# Patient Record
Sex: Male | Born: 1960 | Race: White | Hispanic: No | Marital: Married | Smoking: Former smoker
Health system: Southern US, Community
[De-identification: ages and names within clinical notes are randomized; demographics above are authoritative.]

## PROBLEM LIST (undated history)

## (undated) DIAGNOSIS — I10 Essential (primary) hypertension: Secondary | ICD-10-CM

## (undated) DIAGNOSIS — M1990 Unspecified osteoarthritis, unspecified site: Secondary | ICD-10-CM

## (undated) DIAGNOSIS — M109 Gout, unspecified: Secondary | ICD-10-CM

## (undated) DIAGNOSIS — E785 Hyperlipidemia, unspecified: Secondary | ICD-10-CM

## (undated) DIAGNOSIS — G709 Myoneural disorder, unspecified: Secondary | ICD-10-CM

## (undated) HISTORY — PX: SPINE SURGERY: SHX786

## (undated) HISTORY — PX: INNER EAR SURGERY: SHX679

## (undated) HISTORY — PX: BACK SURGERY: SHX140

## (undated) HISTORY — PX: CERVICAL DISC SURGERY: SHX588

## (undated) HISTORY — PX: OTHER SURGICAL HISTORY: SHX169

## (undated) HISTORY — PX: KNEE SURGERY: SHX244

## (undated) HISTORY — DX: Myoneural disorder, unspecified: G70.9

## (undated) HISTORY — DX: Hyperlipidemia, unspecified: E78.5

---

## 2001-03-13 ENCOUNTER — Ambulatory Visit: Admission: RE | Admit: 2001-03-13 | Discharge: 2001-03-13 | Payer: Self-pay | Admitting: Neurosurgery

## 2001-03-13 ENCOUNTER — Encounter: Payer: Self-pay | Admitting: Neurosurgery

## 2001-03-25 ENCOUNTER — Observation Stay (HOSPITAL_COMMUNITY): Admission: RE | Admit: 2001-03-25 | Discharge: 2001-03-26 | Payer: Self-pay | Admitting: Neurosurgery

## 2012-04-24 ENCOUNTER — Emergency Department (HOSPITAL_COMMUNITY): Payer: Medicare PPO

## 2012-04-24 ENCOUNTER — Encounter (HOSPITAL_COMMUNITY): Payer: Self-pay

## 2012-04-24 ENCOUNTER — Emergency Department (HOSPITAL_COMMUNITY)
Admission: EM | Admit: 2012-04-24 | Discharge: 2012-04-24 | Disposition: A | Discharge status: Home / Self Care | Payer: Medicare PPO | Attending: Emergency Medicine | Admitting: Emergency Medicine

## 2012-04-24 DIAGNOSIS — R109 Unspecified abdominal pain: Secondary | ICD-10-CM

## 2012-04-24 DIAGNOSIS — R110 Nausea: Secondary | ICD-10-CM | POA: Insufficient documentation

## 2012-04-24 DIAGNOSIS — R1011 Right upper quadrant pain: Secondary | ICD-10-CM | POA: Insufficient documentation

## 2012-04-24 DIAGNOSIS — Z79899 Other long term (current) drug therapy: Secondary | ICD-10-CM | POA: Insufficient documentation

## 2012-04-24 DIAGNOSIS — Z87891 Personal history of nicotine dependence: Secondary | ICD-10-CM | POA: Insufficient documentation

## 2012-04-24 DIAGNOSIS — I10 Essential (primary) hypertension: Secondary | ICD-10-CM | POA: Insufficient documentation

## 2012-04-24 DIAGNOSIS — M129 Arthropathy, unspecified: Secondary | ICD-10-CM | POA: Insufficient documentation

## 2012-04-24 DIAGNOSIS — R509 Fever, unspecified: Secondary | ICD-10-CM | POA: Insufficient documentation

## 2012-04-24 DIAGNOSIS — M109 Gout, unspecified: Secondary | ICD-10-CM | POA: Insufficient documentation

## 2012-04-24 HISTORY — DX: Essential (primary) hypertension: I10

## 2012-04-24 HISTORY — DX: Unspecified osteoarthritis, unspecified site: M19.90

## 2012-04-24 LAB — CBC WITH DIFFERENTIAL/PLATELET
Basophils Absolute: 0 10*3/uL (ref 0.0–0.1)
HCT: 52.3 % — ABNORMAL HIGH (ref 39.0–52.0)
Lymphocytes Relative: 13 % (ref 12–46)
Monocytes Absolute: 1.3 10*3/uL — ABNORMAL HIGH (ref 0.1–1.0)
Neutro Abs: 9.7 10*3/uL — ABNORMAL HIGH (ref 1.7–7.7)
RBC: 5.47 MIL/uL (ref 4.22–5.81)
RDW: 14.3 % (ref 11.5–15.5)
WBC: 12.9 10*3/uL — ABNORMAL HIGH (ref 4.0–10.5)

## 2012-04-24 LAB — URINALYSIS, ROUTINE W REFLEX MICROSCOPIC
Bilirubin Urine: NEGATIVE
Glucose, UA: NEGATIVE mg/dL
Hgb urine dipstick: NEGATIVE
Ketones, ur: NEGATIVE mg/dL
Protein, ur: NEGATIVE mg/dL

## 2012-04-24 LAB — COMPREHENSIVE METABOLIC PANEL
ALT: 32 U/L (ref 0–53)
AST: 33 U/L (ref 0–37)
CO2: 31 mEq/L (ref 19–32)
Chloride: 97 mEq/L (ref 96–112)
Creatinine, Ser: 1.06 mg/dL (ref 0.50–1.35)
GFR calc non Af Amer: 78 mL/min — ABNORMAL LOW (ref >90)
Sodium: 136 mEq/L (ref 135–145)
Total Bilirubin: 0.4 mg/dL (ref 0.3–1.2)

## 2012-04-24 MED ORDER — IOHEXOL 300 MG/ML  SOLN
100.0000 mL | Freq: Once | INTRAMUSCULAR | Status: AC | PRN
  Administered 2012-04-24: 100 mL via INTRAVENOUS

## 2012-04-24 MED ORDER — OXYCODONE-ACETAMINOPHEN 5-325 MG PO TABS: 1.0000 | ORAL_TABLET | Freq: Four times a day (QID) | ORAL | Status: DC | PRN

## 2012-04-24 MED ORDER — HYDROMORPHONE HCL PF 1 MG/ML IJ SOLN
INTRAMUSCULAR | Status: AC
  Administered 2012-04-24: 1 mg via INTRAVENOUS
  Filled 2012-04-24: qty 1

## 2012-04-24 MED ORDER — HYDROMORPHONE HCL PF 1 MG/ML IJ SOLN
1.0000 mg | Freq: Once | INTRAMUSCULAR | Status: AC
  Administered 2012-04-24: 1 mg via INTRAVENOUS
  Filled 2012-04-24: qty 1

## 2012-04-24 MED ORDER — ONDANSETRON HCL 4 MG/2ML IJ SOLN
4.0000 mg | Freq: Once | INTRAMUSCULAR | Status: AC
  Administered 2012-04-24: 4 mg via INTRAVENOUS
  Filled 2012-04-24: qty 2

## 2012-04-24 MED ORDER — HYDROMORPHONE HCL PF 1 MG/ML IJ SOLN
1.0000 mg | Freq: Once | INTRAMUSCULAR | Status: AC
  Administered 2012-04-24: 1 mg via INTRAVENOUS

## 2012-04-24 NOTE — ED Provider Notes (Signed)
History   This chart was scribed for Brent Lennert, MD by Brent Hamilton. The patient was seen in room APA03/APA03. Patient's care was started at 1358.  CSN: [id number]  Arrival date & time 04/24/12  1358   First MD Initiated Contact with Patient 04/24/12 1503      Chief Complaint  Patient presents with  . Abdominal Pain   Patient is a 51 y.o. male presenting with abdominal pain. The history is provided by the patient. No language interpreter was used.  Abdominal Pain The primary symptoms of the illness include abdominal pain, fever and nausea. The primary symptoms of the illness do not include vomiting, diarrhea or dysuria. The current episode started 2 days ago. The onset of the illness was sudden. The problem has been gradually worsening.  The abdominal pain began 2 days ago. The pain came on suddenly. The abdominal pain has been gradually worsening since its onset. The abdominal pain is located in the RUQ. The abdominal pain radiates to the back. The abdominal pain is relieved by nothing. The abdominal pain is exacerbated by certain positions.  The patient states that she believes she is currently not pregnant.    Brent Hamilton is a 51 y.o. male who presents to the Emergency Department complaining of 2 days of sudden onset, constant, gradually worsening, moderate RUQ abdominal pain and swelling with associated nausea mild fever. Pain is described as radiating to the back, aggravated with palpation, and alleviated by nothing. Pt reports evaluation by PCP Thursday with Dx of constipation. He has taken MiraLax and stool softer with no relief to pain. Pt denies chills, cough, congestion, rhinorrhea, chest pain, SOB, emesis, and h/o abdominal surgery. Pertinent history includes HTN and c6/c7 back surgery fusion.   Past Medical History  Diagnosis Date  . Hypertension   . Arthritis     Past Surgical History  Procedure Date  . Inner ear surgery     recently  . Ulnar nerve surgeries   .  Knee surgery   . Back surgery     c6/c7 fusion    No family history on file.  History  Substance Use Topics  . Smoking status: Never Smoker   . Smokeless tobacco: Not on file  . Alcohol Use: No    Review of Systems  Constitutional: Positive for fever.  Gastrointestinal: Positive for nausea and abdominal pain. Negative for vomiting and diarrhea.  Genitourinary: Negative for dysuria.  All other systems reviewed and are negative.    Allergies  Review of patient's allergies indicates no known allergies.  Home Medications   Current Outpatient Rx  Name Route Sig Dispense Refill  . ANTIPYRINE-BENZOCAINE 5.4-1.4 % OT SOLN Otic Place 3 drops in ear(s) 4 (four) times daily as needed.    Marland Kitchen VITAMIN D 1000 UNITS PO TABS Oral Take 2,000 Units by mouth daily.    . FENTANYL 50 MCG/HR TD PT72 Transdermal Place 1 patch onto the skin every 3 (three) days.    Marland Kitchen FOLIC ACID 1 MG PO TABS Oral Take 1 mg by mouth daily.    Marland Kitchen LISINOPRIL 40 MG PO TABS Oral Take 40 mg by mouth daily.    Marland Kitchen METHOTREXATE SODIUM (PF) 250 MG/10ML IJ SOLN Injection Inject 0.6 mLs as directed every 7 (seven) days.    Marland Kitchen MOXIFLOXACIN HCL 400 MG PO TABS Oral Take 400 mg by mouth daily. For 10 days    . ZINC GLUCONATE 50 MG PO TABS Oral Take 50 mg by mouth daily.  BP 140/96  Pulse 87  Temp 98.2 F (36.8 C) (Oral)  Resp 20  Ht 6' (1.829 m)  Wt 282 lb (127.914 kg)  BMI 38.25 kg/m2  SpO2 100%  Physical Exam  Constitutional: He is oriented to person, place, and time. He appears well-developed.  HENT:  Head: Normocephalic and atraumatic.  Eyes: Conjunctivae normal and EOM are normal. No scleral icterus.  Neck: Neck supple. No thyromegaly present.  Cardiovascular: Normal rate and regular rhythm.  Exam reveals no gallop and no friction rub.   No murmur heard. Pulmonary/Chest: No stridor. He has no wheezes. He has no rales. He exhibits no tenderness.  Abdominal: There is no rebound.       Moderate RUQ tenderness.    Musculoskeletal: Normal range of motion. He exhibits no edema.  Lymphadenopathy:    He has no cervical adenopathy.  Neurological: He is oriented to person, place, and time. Coordination normal.  Skin: No rash noted. No erythema.  Psychiatric: He has a normal mood and affect. His behavior is normal.    ED Course  Procedures DIAGNOSTIC STUDIES: Oxygen Saturation is 100% on room air, normal by my interpretation.    COORDINATION OF CARE: 15:01- Evaluated Pt. Pt is awake, alert,and oriented. Appears to be in discomfort.  15:10- Ordered US Abdomen Complete 1 time imaging.  Labs Reviewed  CBC WITH DIFFERENTIAL - Abnormal; Notable for the following:    WBC 12.9 (*)     Hemoglobin 17.1 (*)     HCT 52.3 (*)     Neutro Abs 9.7 (*)     Monocytes Absolute 1.3 (*)     All other components within normal limits  COMPREHENSIVE METABOLIC PANEL - Abnormal; Notable for the following:    GFR calc non Af Amer 78 (*)     All other components within normal limits  LIPASE, BLOOD  URINALYSIS, ROUTINE W REFLEX MICROSCOPIC   Ct Abdomen Pelvis W Contrast  04/24/2012  *RADIOLOGY REPORT*  Clinical Data: Abdominal pain  CT ABDOMEN AND PELVIS WITH CONTRAST  Technique:  Multidetector CT imaging of the abdomen and pelvis was performed following the standard protocol during bolus administration of intravenous contrast.  Contrast: OMNIPAQUE IOHEXOL 300 MG/ML  SOLN  Comparison: None.  Findings:  The lung bases are clear.  No pericardial or pleural effusion.  Mild diffuse fatty infiltration of the liver.  There are no focal liver abnormalities identified.  There is a stone identified within the dependent portion of the gallbladder measuring 6.2 mm, image 29.  There is no biliary dilatation.  The pancreas appears within normal limits.  The spleen is normal.  Both adrenal glands appear normal.  The right kidney is unremarkable.  The left kidney is normal.  There is no evidence for nephrolithiasis or obstructive  uropathy.  The urinary bladder appears normal.  Normal appearance of the prostate gland and seminal vesicles.  No enlarged upper abdominal lymph nodes.  There is no pelvic or inguinal adenopathy.  No free fluid noted.  The stomach and the small bowel loops appear within normal limits.  The appendix is visualized and appears normal.  The normal appearance of the colon.  Review of the visualized osseous structures is significant for mild multilevel lumbar degenerative disc disease.  IMPRESSION:  1.  No acute findings identified within the abdomen or pelvis. 2.  Gallstone.   Original Report Authenticated By: Rosealee Albee, M.D.    US Abdomen Limited Ruq  04/24/2012  *RADIOLOGY REPORT*  Clinical Data:  Right upper quadrant pain  LIMITED ABDOMINAL ULTRASOUND - RIGHT UPPER QUADRANT  Comparison:  None.  Findings:  Gallbladder:  No gallbladder wall thickening or pericholecystic fluid.  There is an echogenic gallstone which is mobile within the gallbladder lumen measuring 1.1 cm.  Negative sonographic Murphy's sign.  Common bile duct:  Upper limits of normal at 6 mm.  Liver:  Liver is homogeneous in echotexture.  There liver has an apparent nodular contour.  No evidence of ascites. No ductal dilatation.  IMPRESSION: 1.  Cholelithiasis without cholecystitis. 2.  Heterogeneous liver with nodule nodular contour  suggests findings of cirrhosis.  Recommend clinical correlation and consider further examination with MRI with and without contrast in the outpatient setting.            Original Report Authenticated By: Genevive Bi, M.D.      No diagnosis found.    MDM      The chart was scribed for me under my direct supervision.  I personally performed the history, physical, and medical decision making and all procedures in the evaluation of this patient.Brent Lennert, MD 04/24/12 [phone]

## 2012-04-24 NOTE — ED Notes (Signed)
Pt to ultrasound

## 2012-04-24 NOTE — ED Notes (Signed)
Pt returned from ct scan, wife at bedside.

## 2012-04-24 NOTE — ED Notes (Signed)
Pt reports having upper quad ab pain for 2 days, was seen by pmd and told he may be constipated/impacted, has taken miralax and mag. Citrate. Only passed water, pain is worse today.

## 2012-04-24 NOTE — ED Notes (Signed)
Pt reports went to PCP THursday for upper abd pain and swelling.  Says his PCP tolsd him he thought he was impacted.  Pt says pain is more severe now and is radiating around to back.  PCP intstructed pt to take miralax and mag citrate.  Pt says only passed water.

## 2012-04-28 ENCOUNTER — Encounter (HOSPITAL_COMMUNITY)
Admission: RE | Admit: 2012-04-28 | Discharge: 2012-04-28 | Disposition: A | Discharge status: Home / Self Care | Payer: Medicare FFS | Source: Ambulatory Visit | Attending: General Surgery | Admitting: General Surgery

## 2012-04-28 ENCOUNTER — Encounter (HOSPITAL_COMMUNITY): Payer: Self-pay

## 2012-04-28 ENCOUNTER — Encounter (HOSPITAL_COMMUNITY): Payer: Self-pay | Admitting: Pharmacy Technician

## 2012-04-28 HISTORY — DX: Gout, unspecified: M10.9

## 2012-04-28 NOTE — Progress Notes (Signed)
04/28/12 1524  OBSTRUCTIVE SLEEP APNEA  Have you ever been diagnosed with sleep apnea through a sleep study? No  Do you snore loudly (loud enough to be heard through closed doors)?  0  Do you often feel tired, fatigued, or sleepy during the daytime? 0  Has anyone observed you stop breathing during your sleep? 0  Do you have, or are you being treated for high blood pressure? 1  BMI more than 35 kg/m2? 1  Age over 53 years old? 1  Neck circumference greater than 40 cm/18 inches? 1  Gender: 1  Obstructive Sleep Apnea Score 5   Score 4 or greater  Results sent to PCP

## 2012-04-28 NOTE — Patient Instructions (Addendum)
20 Brent Hamilton  04/28/2012   Your procedure is scheduled on:   05/01/2012   Report to [facility] at  700  AM.  Call this number if you have problems the morning of surgery: [phone]   Remember:   Do not eat food:After Midnight.  May have clear liquids:until Midnight .    Take these medicines the morning of surgery with A SIP OF WATER:  Percocet,lisinopril. May wear Duragesic patch in.   Do not wear jewelry, make-up or nail polish.  Do not wear lotions, powders, or perfumes.   Do not shave 48 hours prior to surgery. Men may shave face and neck.  Do not bring valuables to the hospital.  Contacts, dentures or bridgework may not be worn into surgery.  Leave suitcase in the car. After surgery it may be brought to your room.  For patients admitted to the hospital, checkout time is 11:00 AM the day of discharge.   Patients discharged the day of surgery will not be allowed to drive home.  Name and phone number of your driver: family  Special Instructions: Shower using CHG 2 nights before surgery and the night before surgery.  If you shower the day of surgery use CHG.  Use special wash - you have one bottle of CHG for all showers.  You should use approximately 1/3 of the bottle for each shower.   Please read over the following fact sheets that you were given: Pain Booklet, Coughing and Deep Breathing, MRSA Information, Surgical Site Infection Prevention, Anesthesia Post-op Instructions and Care and Recovery After Surgery Laparoscopic Cholecystectomy Laparoscopic cholecystectomy is surgery to remove the gallbladder. The gallbladder is located slightly to the right of center in the abdomen, behind the liver. It is a concentrating and storage sac for the bile produced in the liver. Bile aids in the digestion and absorption of fats. Gallbladder disease (cholecystitis) is an inflammation of your gallbladder. This condition is usually caused by a buildup of gallstones (cholelithiasis) in your  gallbladder. Gallstones can block the flow of bile, resulting in inflammation and pain. In severe cases, emergency surgery may be required. When emergency surgery is not required, you will have time to prepare for the procedure. Laparoscopic surgery is an alternative to open surgery. Laparoscopic surgery usually has a shorter recovery time. Your common bile duct may also need to be examined and explored. Your caregiver will discuss this with you if he or she feels this should be done. If stones are found in the common bile duct, they may be removed. LET YOUR CAREGIVER KNOW ABOUT:  Allergies to food or medicine.  Medicines taken, including vitamins, herbs, eyedrops, over-the-counter medicines, and creams.  Use of steroids (by mouth or creams).  Previous problems with anesthetics or numbing medicines.  History of bleeding problems or blood clots.  Previous surgery.  Other health problems, including diabetes and kidney problems.  Possibility of pregnancy, if this applies. RISKS AND COMPLICATIONS All surgery is associated with risks. Some problems that may occur following this procedure include:  Infection.  Damage to the common bile duct, nerves, arteries, veins, or other internal organs such as the stomach or intestines.  Bleeding.  A stone may remain in the common bile duct. BEFORE THE PROCEDURE  Do not take aspirin for 3 days prior to surgery or blood thinners for 1 week prior to surgery.  Do not eat or drink anything after midnight the night before surgery.  Let your caregiver know if you  develop a cold or other infectious problem prior to surgery.  You should be present 60 minutes before the procedure or as directed. PROCEDURE  You will be given medicine that makes you sleep (general anesthetic). When you are asleep, your surgeon will make several small cuts (incisions) in your abdomen. One of these incisions is used to insert a small, lighted scope (laparoscope) into the  abdomen. The laparoscope helps the surgeon see into your abdomen. Carbon dioxide gas will be pumped into your abdomen. The gas allows more room for the surgeon to perform your surgery. Other operating instruments are inserted through the other incisions. Laparoscopic procedures may not be appropriate when:  There is major scarring from previous surgery.  The gallbladder is extremely inflamed.  There are bleeding disorders or unexpected cirrhosis of the liver.  A pregnancy is near term.  Other conditions make the laparoscopic procedure impossible. If your surgeon feels it is not safe to continue with a laparoscopic procedure, he or she will perform an open abdominal procedure. In this case, the surgeon will make an incision to open the abdomen. This gives the surgeon a larger view and field to work within. This may allow the surgeon to perform procedures that sometimes cannot be performed with a laparoscope alone. Open surgery has a longer recovery time. AFTER THE PROCEDURE  You will be taken to the recovery area where a nurse will watch and check your progress.  You may be allowed to go home the same day.  Do not resume physical activities until directed by your caregiver.  You may resume a normal diet and activities as directed. Document Released: 06/17/2005 Document Revised: 09/09/2011 Document Reviewed: 11/30/2010 [facility] Patient Information 2013 [address], [place]. PATIENT INSTRUCTIONS POST-ANESTHESIA  IMMEDIATELY FOLLOWING SURGERY:  Do not drive or operate machinery for the first twenty four hours after surgery.  Do not make any important decisions for twenty four hours after surgery or while taking narcotic pain medications or sedatives.  If you develop intractable nausea and vomiting or a severe headache please notify your doctor immediately.  FOLLOW-UP:  Please make an appointment with your surgeon as instructed. You do not need to follow up with anesthesia unless specifically  instructed to do so.  WOUND CARE INSTRUCTIONS (if applicable):  Keep a dry clean dressing on the anesthesia/puncture wound site if there is drainage.  Once the wound has quit draining you may leave it open to air.  Generally you should leave the bandage intact for twenty four hours unless there is drainage.  If the epidural site drains for more than 36-48 hours please call the anesthesia department.  QUESTIONS?:  Please feel free to call your physician or the hospital operator if you have any questions, and they will be happy to assist you.

## 2012-04-28 NOTE — H&P (Signed)
NTS SOAP Note  Vital Signs:  Vitals as of: 04/28/2012: Systolic 159: Diastolic 98: Heart Rate 95: Temp 99.56F: Height 26ft 0in: Weight 284Lbs 0 Ounces: Pain Level 7: BMI 39  BMI : 38.52 kg/m2  Subjective: This 51 Years 75 Months old Male presents for of    ABDOMINAL ISSUES: ,Has been having intermittent episodes of right upper quadrant abdominal pai with radiation to the right flank, nausea, bloating, and fatty food intolerance for some time now.  Recent CT scan of the abdomen and U/S of gallbladder reveals fatty liver, cholelithiasis.  Review of Symptoms:  Constitutional:  chills Head:unremarkable    Eyes:unremarkable   Nose/Mouth/Throat:unremarkable Cardiovascular:  unremarkable   Respiratory:unremarkable   Gastrointestinal:  unremarkable   Genitourinary:unremarkable       back pain Skin:unremarkable Hematolgic/Lymphatic:unremarkable     Allergic/Immunologic:unremarkable     Past Medical History:    Reviewed   Past Medical History  Surgical History: neck fusion, multiple bilateral knee surgeries, carpal tunnel surgeries Medical Problems: none Allergies: nkda Medications: none   Social History:Reviewed  Social History  Preferred Language: English Race:  White Ethnicity: Not Hispanic / Latino Age: 51 Years 6 Months Marital Status:  M Alcohol:  No Recreational drug(s):  No   Smoking Status: Never smoker reviewed on 04/28/2012 Functional Status reviewed on mm/dd/yyyy ------------------------------------------------ Bathing: Normal Cooking: Normal Dressing: Normal Driving: Normal Eating: Normal Managing Meds: Normal Oral Care: Normal Shopping: Normal Toileting: Normal Transferring: Normal Walking: Normal Cognitive Status reviewed on mm/dd/yyyy ------------------------------------------------ Attention: Normal Decision Making: Normal Language: Normal Memory: Normal Motor: Normal Perception: Normal Problem  Solving: Normal Visual and Spatial: Normal   Family History:  Reviewed   Family History              Father:  Cancer-stomach             Mother:  Coronary Artery Disease    Objective Information: General:  Well appearing, well nourished in no distress.   no scleral icterus Heart:  RRR, no murmur Lungs:    CTA bilaterally, no wheezes, rhonchi, rales.  Breathing unlabored. Abdomen:Soft, NT/ND, no HSM, no masses.  Slightly tender in right upper quadrant to deep palpation.  Assessment:Biliary colic, cholelithiasis  Diagnosis &amp; Procedure: DiagnosisCode: 574.20, ProcedureCode: 29562,    Plan:Scheduled for laparoscopic cholecystectomy on 05/01/12.   Patient Education:Alternative treatments to surgery were discussed with patient (and family).  Risks and benefits  of procedure including hepatobiliary injury, bile leak, and possibility of an open procedure were fully explained to the patient (and family) who gave informed consent. Patient/family questions were addressed.  Follow-up:Pending Surgery

## 2012-05-01 ENCOUNTER — Encounter (HOSPITAL_COMMUNITY): Payer: Self-pay | Admitting: Anesthesiology

## 2012-05-01 ENCOUNTER — Encounter (HOSPITAL_COMMUNITY)
Admission: RE | Disposition: A | Discharge status: Home / Self Care | Payer: Self-pay | Source: Ambulatory Visit | Attending: General Surgery

## 2012-05-01 ENCOUNTER — Ambulatory Visit (HOSPITAL_COMMUNITY): Payer: Medicare FFS | Admitting: Anesthesiology

## 2012-05-01 ENCOUNTER — Encounter (HOSPITAL_COMMUNITY): Payer: Self-pay | Admitting: *Deleted

## 2012-05-01 ENCOUNTER — Ambulatory Visit (HOSPITAL_COMMUNITY)
Admission: RE | Admit: 2012-05-01 | Discharge: 2012-05-01 | Disposition: A | Discharge status: Home / Self Care | Payer: Medicare FFS | Source: Ambulatory Visit | Attending: General Surgery | Admitting: General Surgery

## 2012-05-01 DIAGNOSIS — K8020 Calculus of gallbladder without cholecystitis without obstruction: Secondary | ICD-10-CM | POA: Insufficient documentation

## 2012-05-01 DIAGNOSIS — I10 Essential (primary) hypertension: Secondary | ICD-10-CM | POA: Insufficient documentation

## 2012-05-01 DIAGNOSIS — Z01810 Encounter for preprocedural cardiovascular examination: Secondary | ICD-10-CM | POA: Insufficient documentation

## 2012-05-01 DIAGNOSIS — Z01812 Encounter for preprocedural laboratory examination: Secondary | ICD-10-CM | POA: Insufficient documentation

## 2012-05-01 HISTORY — PX: CHOLECYSTECTOMY: SHX55

## 2012-05-01 SURGERY — LAPAROSCOPIC CHOLECYSTECTOMY
Anesthesia: General | Wound class: Clean Contaminated

## 2012-05-01 MED ORDER — FENTANYL CITRATE 0.05 MG/ML IJ SOLN
INTRAMUSCULAR | Status: AC
  Filled 2012-05-01: qty 5

## 2012-05-01 MED ORDER — PROPOFOL 10 MG/ML IV EMUL
INTRAVENOUS | Status: DC | PRN
  Administered 2012-05-01: 300 mg via INTRAVENOUS

## 2012-05-01 MED ORDER — EPHEDRINE SULFATE 50 MG/ML IJ SOLN
INTRAMUSCULAR | Status: AC
  Filled 2012-05-01: qty 1

## 2012-05-01 MED ORDER — BUPIVACAINE HCL (PF) 0.5 % IJ SOLN
INTRAMUSCULAR | Status: AC
  Filled 2012-05-01: qty 30

## 2012-05-01 MED ORDER — MIDAZOLAM HCL 2 MG/2ML IJ SOLN
INTRAMUSCULAR | Status: AC
  Filled 2012-05-01: qty 2

## 2012-05-01 MED ORDER — PROPOFOL 10 MG/ML IV EMUL
INTRAVENOUS | Status: AC
  Filled 2012-05-01: qty 20

## 2012-05-01 MED ORDER — SUCCINYLCHOLINE CHLORIDE 20 MG/ML IJ SOLN
INTRAMUSCULAR | Status: AC
  Filled 2012-05-01: qty 1

## 2012-05-01 MED ORDER — ENOXAPARIN SODIUM 40 MG/0.4ML ~~LOC~~ SOLN
40.0000 mg | Freq: Once | SUBCUTANEOUS | Status: AC
  Administered 2012-05-01: 40 mg via SUBCUTANEOUS

## 2012-05-01 MED ORDER — HYDROCODONE-ACETAMINOPHEN 10-500 MG PO TABS: 1.0000 | ORAL_TABLET | ORAL | Status: DC | PRN

## 2012-05-01 MED ORDER — ENOXAPARIN SODIUM 40 MG/0.4ML ~~LOC~~ SOLN
SUBCUTANEOUS | Status: AC
  Filled 2012-05-01: qty 0.4

## 2012-05-01 MED ORDER — CHLORHEXIDINE GLUCONATE 4 % EX LIQD: 1.0000 "application " | Freq: Once | CUTANEOUS | Status: DC

## 2012-05-01 MED ORDER — SODIUM CHLORIDE 0.9 % IR SOLN
Status: DC | PRN
  Administered 2012-05-01: 1000 mL

## 2012-05-01 MED ORDER — FENTANYL CITRATE 0.05 MG/ML IJ SOLN
INTRAMUSCULAR | Status: AC
  Filled 2012-05-01: qty 2

## 2012-05-01 MED ORDER — GLYCOPYRROLATE 0.2 MG/ML IJ SOLN
INTRAMUSCULAR | Status: DC | PRN
  Administered 2012-05-01: 0.6 mg via INTRAVENOUS

## 2012-05-01 MED ORDER — GLYCOPYRROLATE 0.2 MG/ML IJ SOLN
INTRAMUSCULAR | Status: AC
  Filled 2012-05-01: qty 2

## 2012-05-01 MED ORDER — FENTANYL CITRATE 0.05 MG/ML IJ SOLN
25.0000 ug | INTRAMUSCULAR | Status: DC | PRN
  Administered 2012-05-01 (×5): 50 ug via INTRAVENOUS

## 2012-05-01 MED ORDER — FENTANYL CITRATE 0.05 MG/ML IJ SOLN
50.0000 ug | INTRAMUSCULAR | Status: DC | PRN
  Administered 2012-05-01: 50 ug via INTRAVENOUS

## 2012-05-01 MED ORDER — ROCURONIUM BROMIDE 50 MG/5ML IV SOLN
INTRAVENOUS | Status: AC
  Filled 2012-05-01: qty 1

## 2012-05-01 MED ORDER — KETOROLAC TROMETHAMINE 30 MG/ML IJ SOLN
30.0000 mg | Freq: Once | INTRAMUSCULAR | Status: AC
  Administered 2012-05-01: 30 mg via INTRAVENOUS

## 2012-05-01 MED ORDER — DEXAMETHASONE SODIUM PHOSPHATE 4 MG/ML IJ SOLN
INTRAMUSCULAR | Status: AC
  Filled 2012-05-01: qty 1

## 2012-05-01 MED ORDER — LIDOCAINE HCL (PF) 1 % IJ SOLN
INTRAMUSCULAR | Status: AC
  Filled 2012-05-01: qty 5

## 2012-05-01 MED ORDER — CEFAZOLIN SODIUM-DEXTROSE 2-3 GM-% IV SOLR
INTRAVENOUS | Status: AC
  Filled 2012-05-01: qty 50

## 2012-05-01 MED ORDER — CEFAZOLIN SODIUM 1-5 GM-% IV SOLN
INTRAVENOUS | Status: AC
  Filled 2012-05-01: qty 50

## 2012-05-01 MED ORDER — BUPIVACAINE HCL 0.5 % IJ SOLN
INTRAMUSCULAR | Status: DC | PRN
  Administered 2012-05-01: 10 mL

## 2012-05-01 MED ORDER — NEOSTIGMINE METHYLSULFATE 1 MG/ML IJ SOLN
INTRAMUSCULAR | Status: AC
  Filled 2012-05-01: qty 10

## 2012-05-01 MED ORDER — ONDANSETRON HCL 4 MG/2ML IJ SOLN: 4.0000 mg | Freq: Once | INTRAMUSCULAR | Status: DC | PRN

## 2012-05-01 MED ORDER — NEOSTIGMINE METHYLSULFATE 1 MG/ML IJ SOLN
INTRAMUSCULAR | Status: DC | PRN
  Administered 2012-05-01: 4 mg via INTRAVENOUS

## 2012-05-01 MED ORDER — SUCCINYLCHOLINE CHLORIDE 20 MG/ML IJ SOLN
INTRAMUSCULAR | Status: DC | PRN
  Administered 2012-05-01: 140 mg via INTRAVENOUS

## 2012-05-01 MED ORDER — ONDANSETRON HCL 4 MG/2ML IJ SOLN
4.0000 mg | Freq: Once | INTRAMUSCULAR | Status: AC
  Administered 2012-05-01: 4 mg via INTRAVENOUS

## 2012-05-01 MED ORDER — GLYCOPYRROLATE 0.2 MG/ML IJ SOLN
0.3000 mg | Freq: Once | INTRAMUSCULAR | Status: AC
  Administered 2012-05-01: 0.3 mg via INTRAVENOUS

## 2012-05-01 MED ORDER — LACTATED RINGERS IV SOLN
INTRAVENOUS | Status: DC
  Administered 2012-05-01: 08:00:00 via INTRAVENOUS

## 2012-05-01 MED ORDER — CEFAZOLIN SODIUM-DEXTROSE 2-3 GM-% IV SOLR: 2.0000 g | INTRAVENOUS | Status: DC

## 2012-05-01 MED ORDER — EPHEDRINE SULFATE 50 MG/ML IJ SOLN
INTRAMUSCULAR | Status: DC | PRN
  Administered 2012-05-01: 10 mg via INTRAVENOUS

## 2012-05-01 MED ORDER — DEXAMETHASONE SODIUM PHOSPHATE 4 MG/ML IJ SOLN
4.0000 mg | Freq: Once | INTRAMUSCULAR | Status: AC
  Administered 2012-05-01: 4 mg via INTRAVENOUS

## 2012-05-01 MED ORDER — ROCURONIUM BROMIDE 100 MG/10ML IV SOLN
INTRAVENOUS | Status: DC | PRN
  Administered 2012-05-01: 10 mg via INTRAVENOUS
  Administered 2012-05-01: 30 mg via INTRAVENOUS

## 2012-05-01 MED ORDER — GLYCOPYRROLATE 0.2 MG/ML IJ SOLN
INTRAMUSCULAR | Status: AC
Start: 2012-05-01 — End: 2012-05-01
  Filled 2012-05-01: qty 3

## 2012-05-01 MED ORDER — KETOROLAC TROMETHAMINE 30 MG/ML IJ SOLN
INTRAMUSCULAR | Status: AC
  Filled 2012-05-01: qty 1

## 2012-05-01 MED ORDER — CEFAZOLIN SODIUM-DEXTROSE 2-3 GM-% IV SOLR
INTRAVENOUS | Status: DC | PRN
  Administered 2012-05-01: 3 g via INTRAVENOUS

## 2012-05-01 MED ORDER — MIDAZOLAM HCL 2 MG/2ML IJ SOLN
1.0000 mg | INTRAMUSCULAR | Status: DC | PRN
  Administered 2012-05-01: 2 mg via INTRAVENOUS

## 2012-05-01 MED ORDER — ONDANSETRON HCL 4 MG/2ML IJ SOLN
INTRAMUSCULAR | Status: AC
  Filled 2012-05-01: qty 2

## 2012-05-01 MED ORDER — FENTANYL CITRATE 0.05 MG/ML IJ SOLN
INTRAMUSCULAR | Status: DC | PRN
  Administered 2012-05-01 (×4): 50 ug via INTRAVENOUS
  Administered 2012-05-01: 100 ug via INTRAVENOUS
  Administered 2012-05-01: 50 ug via INTRAVENOUS

## 2012-05-01 MED ORDER — LACTATED RINGERS IV SOLN
INTRAVENOUS | Status: DC | PRN
  Administered 2012-05-01 (×2): via INTRAVENOUS

## 2012-05-01 MED ORDER — HEMOSTATIC AGENTS (NO CHARGE) OPTIME
TOPICAL | Status: DC | PRN
  Administered 2012-05-01: 1 via TOPICAL

## 2012-05-01 SURGICAL SUPPLY — 32 items
APPLIER CLIP LAPSCP 10X32 DD (CLIP) ×2 IMPLANT
BAG HAMPER (MISCELLANEOUS) ×2 IMPLANT
CLOTH BEACON ORANGE TIMEOUT ST (SAFETY) ×2 IMPLANT
COVER LIGHT HANDLE STERIS (MISCELLANEOUS) ×4 IMPLANT
DECANTER SPIKE VIAL GLASS SM (MISCELLANEOUS) ×2 IMPLANT
DURAPREP 26ML APPLICATOR (WOUND CARE) ×2 IMPLANT
ELECT REM PT RETURN 9FT ADLT (ELECTROSURGICAL) ×2
ELECTRODE REM PT RTRN 9FT ADLT (ELECTROSURGICAL) ×1 IMPLANT
FILTER SMOKE EVAC LAPAROSHD (FILTER) ×2 IMPLANT
FORMALIN 10 PREFIL 120ML (MISCELLANEOUS) ×2 IMPLANT
GLOVE BIO SURGEON STRL SZ7.5 (GLOVE) ×2 IMPLANT
GLOVE EXAM NITRILE MD LF STRL (GLOVE) ×2 IMPLANT
GLOVE INDICATOR 7.0 STRL GRN (GLOVE) ×4 IMPLANT
GLOVE SS BIOGEL STRL SZ 6.5 (GLOVE) ×2 IMPLANT
GLOVE SUPERSENSE BIOGEL SZ 6.5 (GLOVE) ×2
GOWN STRL REIN XL XLG (GOWN DISPOSABLE) ×6 IMPLANT
HEMOSTAT SNOW SURGICEL 2X4 (HEMOSTASIS) ×2 IMPLANT
INST SET LAPROSCOPIC AP (KITS) ×2 IMPLANT
KIT ROOM TURNOVER APOR (KITS) ×2 IMPLANT
KIT TROCAR LAP CHOLE (TROCAR) ×2 IMPLANT
MANIFOLD NEPTUNE II (INSTRUMENTS) ×2 IMPLANT
NS IRRIG 1000ML POUR BTL (IV SOLUTION) ×2 IMPLANT
PACK LAP CHOLE LZT030E (CUSTOM PROCEDURE TRAY) ×2 IMPLANT
PAD ARMBOARD 7.5X6 YLW CONV (MISCELLANEOUS) ×2 IMPLANT
POUCH SPECIMEN RETRIEVAL 10MM (ENDOMECHANICALS) ×2 IMPLANT
SET BASIN LINEN APH (SET/KITS/TRAYS/PACK) ×2 IMPLANT
SPONGE GAUZE 2X2 8PLY STRL LF (GAUZE/BANDAGES/DRESSINGS) ×8 IMPLANT
STAPLER VISISTAT (STAPLE) ×2 IMPLANT
SUT VICRYL 0 UR6 27IN ABS (SUTURE) ×2 IMPLANT
TAPE CLOTH SURG 4X10 WHT LF (GAUZE/BANDAGES/DRESSINGS) ×2 IMPLANT
WARMER LAPAROSCOPE (MISCELLANEOUS) ×2 IMPLANT
YANKAUER SUCT 12FT TUBE ARGYLE (SUCTIONS) ×2 IMPLANT

## 2012-05-01 NOTE — Transfer of Care (Signed)
  Anesthesia Post-op Note  Patient: Brent Hamilton  Procedure(s) Performed: Procedure(s) (LRB) with comments: LAPAROSCOPIC CHOLECYSTECTOMY (N/A)  Patient Location: PACU  Anesthesia Type: General  Level of Consciousness: awake, alert , oriented and patient cooperative  Airway and Oxygen Therapy: Patient Spontanous Breathing and Patient connected to face mask oxygen  Post-op Pain: mild  Post-op Assessment: Post-op Vital signs reviewed, Patient's Cardiovascular Status Stable, Respiratory Function Stable, Patent Airway and No signs of Nausea or vomiting  Post-op Vital Signs: Reviewed and stable  Complications: No apparent anesthesia complications  

## 2012-05-01 NOTE — Anesthesia Preprocedure Evaluation (Signed)
Anesthesia Evaluation  Patient identified by MRN, date of birth, ID band Patient awake    Reviewed: Allergy & Precautions, H&P , NPO status , Patient's Chart, lab work & pertinent test results  History of Anesthesia Complications Negative for: history of anesthetic complications  Airway Mallampati: III TM Distance: >3 FB Neck ROM: Limited    Dental  (+) Teeth Intact   Pulmonary sleep apnea , former smoker,  breath sounds clear to auscultation        Cardiovascular hypertension, Pt. on medications Rhythm:Regular Rate:Normal     Neuro/Psych    GI/Hepatic   Endo/Other  Morbid obesity  Renal/GU      Musculoskeletal   Abdominal (+) + obese,   Peds  Hematology   Anesthesia Other Findings   Reproductive/Obstetrics                           Anesthesia Physical Anesthesia Plan  ASA: III  Anesthesia Plan: General   Post-op Pain Management:    Induction: Intravenous, Rapid sequence and Cricoid pressure planned  Airway Management Planned: Oral ETT and Video Laryngoscope Planned  Additional Equipment:   Intra-op Plan:   Post-operative Plan: Extubation in OR  Informed Consent: I have reviewed the patients History and Physical, chart, labs and discussed the procedure including the risks, benefits and alternatives for the proposed anesthesia with the patient or authorized representative who has indicated his/her understanding and acceptance.     Plan Discussed with:   Anesthesia Plan Comments:         Anesthesia Quick Evaluation

## 2012-05-01 NOTE — Op Note (Signed)
Patient:  Brent Hamilton  DOB:  25-Sep-1960  MRN:  [id number]   Preop Diagnosis:  Biliary colic, cholelithiasis  Postop Diagnosis:  Same  Procedure:  Laparoscopic cholecystectomy  Surgeon:  Franky Macho, M.D.  Anes:  General endotracheal  Indications:  Patient is a 51 year old white male who presents with biliary colic secondary to cholelithiasis. The risks and benefits of the procedure including bleeding, infection, hepatobiliary injury, the possibility of an open procedure were fully explained to the patient, gave informed consent.  Procedure note:  The patient was placed in the supine position. After induction of general endotracheal anesthesia, the abdomen was prepped and draped using the usual sterile technique with DuraPrep. Surgical site confirmation was performed.  A supraumbilical incision was made down to the fascia. A Veress needle was introduced into the abdominal cavity and confirmation of placement was done using the saline drop test. The abdomen was then insufflated to 16 mm mercury pressure. An 11 mm trocar was introduced into the abdominal cavity under direct visualization without difficulty. The patient was placed in reverse Trendelenburg position and additional 11 mm trocar was placed the epigastric region and 5 mm trochars were placed the right upper quadrant and right flank regions. Liver was inspected and noted to be within normal limits. The gallbladder was retracted in a dynamic fashion in order to facilitate visualization of the triangle of Calot. The cystic duct was first identified. Its junction were to the infundibulum flow identified. Endoclips were placed proximally and distally on the cystic duct and cystic duct was divided. This was likewise done to the cystic artery. The gallbladder was then freed away from the gallbladder fossa using Bovie electrocautery. The gallbladder delivered through the epigastric trocar site using an Endo Catch bag. The gallbladder fossa was  inspected no abnormal bleeding or bile leakage was noted. Surgicel was placed in the gallbladder fossa. All fluid and air were then evacuated from the abdominal cavity prior to removal of the trochars.  All wounds were irrigated with normal saline. All wounds were injected with 0.5% Sensorcaine. The supraumbilical fascia as well as epigastric fascia were reapproximated using 0 Vicryl interrupted sutures. All skin incisions were closed using staples. Betadine ointment and dry sterile dressings were applied.  All tape and needle counts were correct at the end of the procedure. Patient was extubated in the operating room and went back to recovery room awake in stable condition.  Complications:  None  EBL:  Minimal  Specimen:  Gallbladder

## 2012-05-01 NOTE — Anesthesia Postprocedure Evaluation (Signed)
  Anesthesia Post-op Note  Patient: Brent Hamilton  Procedure(s) Performed: Procedure(s) (LRB) with comments: LAPAROSCOPIC CHOLECYSTECTOMY (N/A)  Patient Location: PACU  Anesthesia Type: General  Level of Consciousness: awake, alert , oriented and patient cooperative  Airway and Oxygen Therapy: Patient Spontanous Breathing and Patient connected to face mask oxygen  Post-op Pain: mild  Post-op Assessment: Post-op Vital signs reviewed, Patient's Cardiovascular Status Stable, Respiratory Function Stable, Patent Airway and No signs of Nausea or vomiting  Post-op Vital Signs: Reviewed and stable  Complications: No apparent anesthesia complications

## 2012-05-01 NOTE — Anesthesia Procedure Notes (Signed)
Procedure Name: Intubation Date/Time: 05/01/2012 8:36 AM Performed by: Carolyne Littles, AMY L Pre-anesthesia Checklist: Patient identified, Patient being monitored, Timeout performed, Emergency Drugs available and Suction available Patient Re-evaluated:Patient Re-evaluated prior to inductionOxygen Delivery Method: Circle System Utilized Preoxygenation: Pre-oxygenation with 100% oxygen Intubation Type: IV induction, Rapid sequence and Cricoid Pressure applied Grade View: Grade I Tube type: Oral Tube size: 8.0 mm Number of attempts: 1 Airway Equipment and Method: stylet and Video-laryngoscopy Placement Confirmation: ETT inserted through vocal cords under direct vision,  positive ETCO2 and breath sounds checked- equal and bilateral Secured at: 24 cm Tube secured with: Tape Dental Injury: Teeth and Oropharynx as per pre-operative assessment

## 2012-05-01 NOTE — Interval H&P Note (Signed)
History and Physical Interval Note:  05/01/2012 8:17 AM  Brent Hamilton  has presented today for surgery, with the diagnosis of Cholelithiasis  The various methods of treatment have been discussed with the patient and family. After consideration of risks, benefits and other options for treatment, the patient has consented to  Procedure(s) (LRB) with comments: LAPAROSCOPIC CHOLECYSTECTOMY (N/A) as a surgical intervention .  The patient's history has been reviewed, patient examined, no change in status, stable for surgery.  I have reviewed the patient's chart and labs.  Questions were answered to the patient's satisfaction.     Franky Macho A

## 2012-05-01 NOTE — Preoperative (Signed)
Beta Blockers   Reason not to administer Beta Blockers:Not Applicable 

## 2012-05-05 ENCOUNTER — Encounter (HOSPITAL_COMMUNITY): Payer: Self-pay | Admitting: General Surgery

## 2012-07-01 ENCOUNTER — Encounter (HOSPITAL_COMMUNITY): Payer: Self-pay

## 2012-07-01 ENCOUNTER — Emergency Department (HOSPITAL_COMMUNITY)
Admission: EM | Admit: 2012-07-01 | Discharge: 2012-07-01 | Disposition: A | Discharge status: Home / Self Care | Payer: Medicare PPO | Attending: Emergency Medicine | Admitting: Emergency Medicine

## 2012-07-01 ENCOUNTER — Emergency Department (HOSPITAL_COMMUNITY): Payer: Medicare PPO

## 2012-07-01 DIAGNOSIS — M25539 Pain in unspecified wrist: Secondary | ICD-10-CM | POA: Insufficient documentation

## 2012-07-01 DIAGNOSIS — Z9889 Other specified postprocedural states: Secondary | ICD-10-CM | POA: Insufficient documentation

## 2012-07-01 DIAGNOSIS — M129 Arthropathy, unspecified: Secondary | ICD-10-CM | POA: Insufficient documentation

## 2012-07-01 DIAGNOSIS — R209 Unspecified disturbances of skin sensation: Secondary | ICD-10-CM | POA: Insufficient documentation

## 2012-07-01 DIAGNOSIS — M109 Gout, unspecified: Secondary | ICD-10-CM | POA: Insufficient documentation

## 2012-07-01 DIAGNOSIS — I10 Essential (primary) hypertension: Secondary | ICD-10-CM | POA: Insufficient documentation

## 2012-07-01 DIAGNOSIS — Z79899 Other long term (current) drug therapy: Secondary | ICD-10-CM | POA: Insufficient documentation

## 2012-07-01 DIAGNOSIS — M25522 Pain in left elbow: Secondary | ICD-10-CM

## 2012-07-01 DIAGNOSIS — Z87891 Personal history of nicotine dependence: Secondary | ICD-10-CM | POA: Insufficient documentation

## 2012-07-01 MED ORDER — OXYCODONE-ACETAMINOPHEN 5-325 MG PO TABS: ORAL_TABLET | ORAL | Status: DC

## 2012-07-01 MED ORDER — OXYCODONE-ACETAMINOPHEN 5-325 MG PO TABS
1.0000 | ORAL_TABLET | Freq: Once | ORAL | Status: AC
  Administered 2012-07-01: 1 via ORAL
  Filled 2012-07-01: qty 1

## 2012-07-01 MED ORDER — PREDNISONE 50 MG PO TABS: 50.0000 mg | ORAL_TABLET | Freq: Every day | ORAL | Status: DC

## 2012-07-01 MED ORDER — PREDNISONE 50 MG PO TABS
60.0000 mg | ORAL_TABLET | Freq: Once | ORAL | Status: AC
  Administered 2012-07-01: 60 mg via ORAL
  Filled 2012-07-01: qty 1

## 2012-07-01 NOTE — ED Notes (Signed)
Pt reports had surgery to left elbow 10 years ago.  Reports over the past 1 1/2 weeks has been having pain and swelling.  Denies any injury.  Pt has strong radial pulse in left arm, can wiggle fingers but says fingers feel numb.

## 2012-07-01 NOTE — ED Provider Notes (Signed)
History     CSN: [id number]  Arrival date & time 07/01/12  1223   First MD Initiated Contact with Patient 07/01/12 1334      Chief Complaint  Patient presents with  . Elbow Pain    (Consider location/radiation/quality/duration/timing/severity/associated sxs/prior treatment) HPI Comments: Pain to L elbow for past few days.  States he struck the elbow on a door but not hard enough he didn't think to cause his current sxs.  He had an  Ulnar nerve release by dr. Channing Mutters ~ years ago.  He hopes to f/u with him or his regular orthopedist within the week.  The history is provided by the patient. No language interpreter was used.    Past Medical History  Diagnosis Date  . Hypertension   . Arthritis   . Gout     Past Surgical History  Procedure Date  . Inner ear surgery     recently-left  . Ulnar nerve surgeries     bilateral-3 on right and 1 on left  . Knee surgery     arthoscopy x3-left knee  . Back surgery     c6/c7 fusion  . Cervical disc surgery     c6-c7 fusion  . Cholecystectomy 05/01/2012    Procedure: LAPAROSCOPIC CHOLECYSTECTOMY;  Surgeon: Dalia Heading, MD;  Location: [facility];  Service: General;  Laterality: N/A;    No family history on file.  History  Substance Use Topics  . Smoking status: Former Smoker -- 0.5 packs/day for 8 years    Types: Cigarettes    Quit date: 04/28/1992  . Smokeless tobacco: Not on file  . Alcohol Use: No      Review of Systems  Constitutional: Negative for fever and chills.  Musculoskeletal:       Elbow pain  Neurological: Positive for numbness. Negative for weakness.  All other systems reviewed and are negative.    Allergies  Morphine and related  Home Medications   Current Outpatient Rx  Name  Route  Sig  Dispense  Refill  . FOLIC ACID 1 MG PO TABS   Oral   Take 1 mg by mouth daily.         Marland Kitchen LISINOPRIL 40 MG PO TABS   Oral   Take 40 mg by mouth daily.         Marland Kitchen METHOTREXATE SODIUM (PF) 250 MG/10ML IJ SOLN  Injection   Inject 0.6 mLs as directed every 7 (seven) days.         . OXYCODONE-ACETAMINOPHEN 5-325 MG PO TABS      One tab po q 4-6 hrs prn pain   20 tablet   0   . PREDNISONE 50 MG PO TABS   Oral   Take 1 tablet (50 mg total) by mouth daily.   6 tablet   0     BP 167/111  Pulse 97  Temp 98.1 F (36.7 C) (Oral)  Resp 17  Ht 6' (1.829 m)  Wt 272 lb (123.378 kg)  BMI 36.89 kg/m2  SpO2 99%  Physical Exam  Nursing note and vitals reviewed. Constitutional: He is oriented to person, place, and time. He appears well-developed and well-nourished.  HENT:  Head: Normocephalic and atraumatic.  Eyes: EOM are normal.  Neck: Normal range of motion.  Cardiovascular: Normal rate, regular rhythm, normal heart sounds and intact distal pulses.   Pulmonary/Chest: Effort normal and breath sounds normal. No respiratory distress.  Abdominal: Soft. He exhibits no distension. There is no tenderness.  Musculoskeletal: He exhibits tenderness.       Arms: Neurological: He is alert and oriented to person, place, and time.  Skin: Skin is warm and dry.  Psychiatric: He has a normal mood and affect. Judgment normal.    ED Course  Procedures (including critical care time)  Labs Reviewed - No data to display Dg Elbow Complete Left  07/01/2012  *RADIOLOGY REPORT*  Clinical Data: Elbow pain.  No known injury.  LEFT ELBOW - COMPLETE 3+ VIEW  Comparison: None.  Findings: No evidence of acute fracture or dislocation.  No evidence of elbow joint effusion.  Mild degenerative spurring of both medial and lateral condyles and coronoid process noted. No significant joint space narrowing or other signs of arthropathy.  IMPRESSION:  1.  No acute findings. 2.  Mild degenerative spurring.   Original Report Authenticated By: Myles Rosenthal, M.D.      1. Left elbow pain       MDM  rx-prednisone 50 mg, 6 rx-percocet, 20 F/u with dr. Channing Mutters or your orthopedist ASAP.        Evalina Field, [place] 07/01/12  1444

## 2012-07-01 NOTE — ED Notes (Signed)
Patient with no complaints at this time. Respirations even and unlabored. Skin warm/dry. Discharge instructions reviewed with patient at this time. Patient given opportunity to voice concerns/ask questions. Patient discharged at this time and left Emergency Department with steady gait.   

## 2012-07-03 NOTE — ED Provider Notes (Signed)
Medical screening examination/treatment/procedure(s) were performed by non-physician practitioner and as supervising physician I was immediately available for consultation/collaboration.   Shelda Jakes, MD 07/03/12 2041

## 2012-08-06 ENCOUNTER — Ambulatory Visit: Payer: Self-pay | Admitting: Pain Medicine

## 2012-08-17 ENCOUNTER — Ambulatory Visit: Payer: Self-pay | Admitting: Pain Medicine

## 2012-09-10 ENCOUNTER — Ambulatory Visit: Payer: Self-pay | Admitting: Pain Medicine

## 2012-10-01 ENCOUNTER — Ambulatory Visit: Payer: Self-pay | Admitting: Pain Medicine

## 2012-10-21 ENCOUNTER — Ambulatory Visit: Payer: Self-pay | Admitting: Pain Medicine

## 2012-10-27 ENCOUNTER — Ambulatory Visit: Payer: Self-pay | Admitting: Pain Medicine

## 2012-11-25 ENCOUNTER — Ambulatory Visit: Payer: Self-pay | Admitting: Pain Medicine

## 2012-12-24 ENCOUNTER — Ambulatory Visit: Payer: Self-pay | Admitting: Pain Medicine

## 2013-01-06 ENCOUNTER — Ambulatory Visit: Payer: Self-pay | Admitting: Pain Medicine

## 2013-01-11 ENCOUNTER — Ambulatory Visit: Payer: Self-pay | Admitting: Pain Medicine

## 2013-01-21 ENCOUNTER — Ambulatory Visit: Payer: Self-pay | Admitting: Pain Medicine

## 2013-02-08 ENCOUNTER — Ambulatory Visit: Payer: Self-pay | Admitting: Pain Medicine

## 2013-03-09 ENCOUNTER — Ambulatory Visit: Payer: Self-pay | Admitting: Pain Medicine

## 2013-04-05 ENCOUNTER — Ambulatory Visit: Payer: Self-pay | Admitting: Pain Medicine

## 2013-04-27 ENCOUNTER — Ambulatory Visit: Payer: Self-pay | Admitting: Pain Medicine

## 2013-05-24 ENCOUNTER — Emergency Department (HOSPITAL_COMMUNITY): Payer: Medicare PPO

## 2013-05-24 ENCOUNTER — Emergency Department (HOSPITAL_COMMUNITY)
Admission: EM | Admit: 2013-05-24 | Discharge: 2013-05-24 | Disposition: A | Discharge status: Home / Self Care | Payer: Medicare PPO | Attending: Emergency Medicine | Admitting: Emergency Medicine

## 2013-05-24 ENCOUNTER — Encounter (HOSPITAL_COMMUNITY): Payer: Self-pay | Admitting: Emergency Medicine

## 2013-05-24 DIAGNOSIS — J039 Acute tonsillitis, unspecified: Secondary | ICD-10-CM

## 2013-05-24 DIAGNOSIS — H9209 Otalgia, unspecified ear: Secondary | ICD-10-CM | POA: Insufficient documentation

## 2013-05-24 DIAGNOSIS — M129 Arthropathy, unspecified: Secondary | ICD-10-CM | POA: Insufficient documentation

## 2013-05-24 DIAGNOSIS — Z79899 Other long term (current) drug therapy: Secondary | ICD-10-CM | POA: Insufficient documentation

## 2013-05-24 DIAGNOSIS — Z87891 Personal history of nicotine dependence: Secondary | ICD-10-CM | POA: Insufficient documentation

## 2013-05-24 DIAGNOSIS — I10 Essential (primary) hypertension: Secondary | ICD-10-CM | POA: Insufficient documentation

## 2013-05-24 DIAGNOSIS — M109 Gout, unspecified: Secondary | ICD-10-CM | POA: Insufficient documentation

## 2013-05-24 LAB — CBC WITH DIFFERENTIAL/PLATELET
Eosinophils Absolute: 0.1 10*3/uL (ref 0.0–0.7)
Hemoglobin: 15.5 g/dL (ref 13.0–17.0)
Lymphocytes Relative: 22 % (ref 12–46)
Lymphs Abs: 2.3 10*3/uL (ref 0.7–4.0)
Monocytes Relative: 9 % (ref 3–12)
Neutro Abs: 6.8 10*3/uL (ref 1.7–7.7)
Neutrophils Relative %: 68 % (ref 43–77)
Platelets: 231 10*3/uL (ref 150–400)
RBC: 4.95 MIL/uL (ref 4.22–5.81)
WBC: 10.1 10*3/uL (ref 4.0–10.5)

## 2013-05-24 LAB — BASIC METABOLIC PANEL
BUN: 12 mg/dL (ref 6–23)
CO2: 28 mEq/L (ref 19–32)
Chloride: 101 mEq/L (ref 96–112)
GFR calc non Af Amer: >90 mL/min (ref >90)
Glucose, Bld: 89 mg/dL (ref 70–99)
Potassium: 4 mEq/L (ref 3.5–5.1)
Sodium: 137 mEq/L (ref 135–145)

## 2013-05-24 MED ORDER — PREDNISONE 20 MG PO TABS: ORAL_TABLET | ORAL | Status: DC

## 2013-05-24 MED ORDER — KETOROLAC TROMETHAMINE 30 MG/ML IJ SOLN
30.0000 mg | Freq: Once | INTRAMUSCULAR | Status: AC
  Administered 2013-05-24: 30 mg via INTRAVENOUS
  Filled 2013-05-24: qty 1

## 2013-05-24 MED ORDER — AMOXICILLIN-POT CLAVULANATE 875-125 MG PO TABS: 1.0000 | ORAL_TABLET | Freq: Two times a day (BID) | ORAL | Status: DC

## 2013-05-24 MED ORDER — DEXTROSE 5 % IV SOLN
1.0000 g | Freq: Once | INTRAVENOUS | Status: AC
  Administered 2013-05-24: 1 g via INTRAVENOUS
  Filled 2013-05-24: qty 10

## 2013-05-24 MED ORDER — METHYLPREDNISOLONE SODIUM SUCC 125 MG IJ SOLR
125.0000 mg | Freq: Once | INTRAMUSCULAR | Status: AC
  Administered 2013-05-24: 125 mg via INTRAVENOUS
  Filled 2013-05-24: qty 2

## 2013-05-24 MED ORDER — IOHEXOL 300 MG/ML  SOLN
75.0000 mL | Freq: Once | INTRAMUSCULAR | Status: AC | PRN
  Administered 2013-05-24: 75 mL via INTRAVENOUS

## 2013-05-24 MED ORDER — SODIUM CHLORIDE 0.9 % IV BOLUS (SEPSIS)
1000.0000 mL | Freq: Once | INTRAVENOUS | Status: AC
  Administered 2013-05-24: 1000 mL via INTRAVENOUS

## 2013-05-24 MED ORDER — TRAMADOL HCL 50 MG PO TABS: 50.0000 mg | ORAL_TABLET | Freq: Four times a day (QID) | ORAL | Status: DC | PRN

## 2013-05-24 NOTE — ED Notes (Signed)
Pt reports wen to PCP Thursday for sore throat.  Reports was put on zpack and flonase.  Pt says now feels like throat is swelling and lymph nodes enlarged in neck.  Pt says feels like he cant' swallow, repeatedly trying to clear his throat.  Pt says at times this morning has felt like he couldn't breathe.

## 2013-05-24 NOTE — ED Provider Notes (Signed)
CSN: [id number]     Arrival date & time 05/24/13  4098 History  This chart was scribed for [name], MD by Bennett Scrape, ED Scribe. This patient was seen in room APA04/[facility]04 and the patient's care was started at 7:36 AM.   Chief Complaint  Patient presents with  . Oral Swelling    Patient is a 52 y.o. male presenting with pharyngitis. The history is provided by the patient. No language interpreter was used.  Sore Throat This is a new problem. The current episode started more than 2 days ago. The problem occurs constantly. The problem has been gradually worsening. Pertinent negatives include no chest pain, no abdominal pain and no headaches. The symptoms are aggravated by swallowing. Nothing relieves the symptoms. The treatment provided no relief.    HPI Comments: Brent Hamilton is a 52 y.o. male who presents to the Emergency Department complaining of gradual onset, gradually worsening, constant sore throat with associated left otalgia and mild fevers. He reports that he came in today due to the new onset of trouble swallowing described as having difficulty swallowing foods this morning and a feeling of mild throat swelling. He admits that the sore throat has been aggravated by swallowing since the onset; although, he clarifies that the trouble swallowing is not associated with pain. He was seen by an [facility] and was given ear drops, Flonase and a Zpak with no improvement. He denies any nausea, emesis and diarrhea.   Past Medical History  Diagnosis Date  . Hypertension   . Arthritis   . Gout    Past Surgical History  Procedure Laterality Date  . Inner ear surgery      recently-left  . Ulnar nerve surgeries      bilateral-3 on right and 1 on left  . Knee surgery      arthoscopy x3-left knee  . Back surgery      c6/c7 fusion  . Cervical disc surgery      c6-c7 fusion  . Cholecystectomy  05/01/2012    Procedure: LAPAROSCOPIC CHOLECYSTECTOMY;  Surgeon: Dalia Heading, MD;   Location: [facility];  Service: General;  Laterality: N/A;   No family history on file. History  Substance Use Topics  . Smoking status: Former Smoker -- 0.50 packs/day for 8 years    Types: Cigarettes    Quit date: 04/28/1992  . Smokeless tobacco: Not on file  . Alcohol Use: No    Review of Systems  Constitutional: Negative for appetite change and fatigue.  HENT: Positive for ear pain and sore throat. Negative for congestion, ear discharge and sinus pressure.   Eyes: Negative for discharge.  Respiratory: Negative for cough.   Cardiovascular: Negative for chest pain.  Gastrointestinal: Negative for abdominal pain and diarrhea.  Genitourinary: Negative for frequency and hematuria.  Musculoskeletal: Negative for back pain.  Skin: Negative for rash.  Neurological: Negative for seizures and headaches.  Psychiatric/Behavioral: Negative for hallucinations.    Allergies  Morphine and related  Home Medications   Current Outpatient Rx  Name  Route  Sig  Dispense  Refill  . folic acid (FOLVITE) 1 MG tablet   Oral   Take 1 mg by mouth daily.         Marland Kitchen lisinopril (PRINIVIL,ZESTRIL) 40 MG tablet   Oral   Take 40 mg by mouth daily.         . Methotrexate Sodium, PF, 250 MG/10ML SOLN   Injection   Inject 0.6 mLs as  directed every 7 (seven) days.         Marland Kitchen oxyCODONE-acetaminophen (PERCOCET/ROXICET) 5-325 MG per tablet      One tab po q 4-6 hrs prn pain   20 tablet   0   . predniSONE (DELTASONE) 50 MG tablet   Oral   Take 1 tablet (50 mg total) by mouth daily.   6 tablet   0    Triage Vitals: BP 138/81  Pulse 92  Temp(Src) 99.3 F (37.4 C) (Oral)  Resp 18  Ht 6' (1.829 m)  Wt 253 lb (114.76 kg)  BMI 34.31 kg/m2  SpO2 92%  Physical Exam  Nursing note and vitals reviewed. Constitutional: He is oriented to person, place, and time. He appears well-developed and well-nourished.  HENT:  Head: Normocephalic and atraumatic.  Mouth/Throat: Oropharynx is clear and  moist.  oropharynx appears normal, no erythema or exudate. Uvula is midline  Eyes: Conjunctivae and EOM are normal. No scleral icterus.  Neck: Neck supple. No thyromegaly present.  Cardiovascular: Normal rate and regular rhythm.  Exam reveals no gallop and no friction rub.   No murmur heard. Pulmonary/Chest: Effort normal and breath sounds normal. No stridor. He has no wheezes. He has no rales. He exhibits no tenderness.  Musculoskeletal: Normal range of motion. He exhibits no edema.  Lymphadenopathy:    He has cervical adenopathy (tender lymph nodes anteriorly to bilateral neck).  Neurological: He is alert and oriented to person, place, and time. He exhibits normal muscle tone. Coordination normal.  Skin: Skin is warm and dry. No rash noted. No erythema.  Psychiatric: He has a normal mood and affect. His behavior is normal.    ED Course  Procedures (including critical care time)  Medications  cefTRIAXone (ROCEPHIN) 1 g in dextrose 5 % 50 mL IVPB (1 g Intravenous New Bag/Given 05/24/13 1031)  methylPREDNISolone sodium succinate (SOLU-MEDROL) 125 mg/2 mL injection 125 mg (not administered)  sodium chloride 0.9 % bolus 1,000 mL (0 mLs Intravenous Stopped 05/24/13 1005)  ketorolac (TORADOL) 30 MG/ML injection 30 mg (30 mg Intravenous Given 05/24/13 0806)  iohexol (OMNIPAQUE) 300 MG/ML solution 75 mL (75 mLs Intravenous Contrast Given 05/24/13 0915)   DIAGNOSTIC STUDIES: Oxygen Saturation is 92% on RA, adequate by my interpretation.    COORDINATION OF CARE: 7:40 AM-Discussed treatment plan which includes CT of neck, CBC panel, BMP and pain medicatino with pt at bedside and pt agreed to plan.   10:36 AM-Informed pt of radiology and lab work results showing mild left tonsillitis. Discussed discharge plan which includes antibiotics, pain medication and a short course of steroids with pt and pt agreed to plan. Also advised pt to follow up as needed and pt agreed. Addressed symptoms to return  for with pt. Will officially discharge the pt once the IV rocephin is administered.  Labs Review Labs Reviewed  CBC WITH DIFFERENTIAL  BASIC METABOLIC PANEL   Imaging Review Ct Soft Tissue Neck W Contrast  05/24/2013   CLINICAL DATA:  Sore throat with associated left otalgia. Mild fevers and trouble swallowing.  EXAM: CT NECK WITH CONTRAST  TECHNIQUE: Multidetector CT imaging of the neck was performed using the standard protocol following the bolus administration of intravenous contrast.  CONTRAST:  75mL OMNIPAQUE IOHEXOL 300 MG/ML  SOLN  COMPARISON:  None.  FINDINGS: The visualized portion of the brain is unremarkable. The visualized paranasal sinuses and mastoid air cells are clear. There is mild asymmetric enlargement of the left tonsil without discrete soft tissue mass or  fluid collection. The nasopharynx, oral cavity, and larynx are unremarkable. The parotid glands and submandibular glands are unremarkable. No gross thyroid abnormality is identified. Mild atherosclerotic calcification is noted involving the carotid bifurcations bilaterally. Left level II lymph node measures 9 mm in short axis, likely reactive. Prior ACDF as noted at C6-7.  IMPRESSION: Mild enlargement of the left tonsil without discrete soft tissue mass or fluid collection, suggestive of tonsillitis.   Electronically Signed   By: Sebastian Ache   On: 05/24/2013 10:09    EKG Interpretation   None       MDM  Severe tonsilitis.  tx with augmentin and steroids The chart was scribed for me under my direct supervision.  I personally performed the history, physical, and medical decision making and all procedures in the evaluation of this patient.[name], MD 05/24/13 [phone]

## 2013-06-03 ENCOUNTER — Ambulatory Visit: Payer: Self-pay | Admitting: Pain Medicine

## 2013-06-21 ENCOUNTER — Ambulatory Visit: Payer: Self-pay | Admitting: Pain Medicine

## 2013-07-07 ENCOUNTER — Ambulatory Visit: Payer: Self-pay | Admitting: Pain Medicine

## 2013-08-03 ENCOUNTER — Ambulatory Visit: Payer: Self-pay | Admitting: Pain Medicine

## 2013-08-30 ENCOUNTER — Ambulatory Visit: Payer: Self-pay | Admitting: Family Medicine

## 2013-09-02 ENCOUNTER — Encounter (HOSPITAL_COMMUNITY): Payer: Self-pay | Admitting: Emergency Medicine

## 2013-09-02 ENCOUNTER — Emergency Department (HOSPITAL_COMMUNITY): Payer: Medicare PPO

## 2013-09-02 ENCOUNTER — Emergency Department (HOSPITAL_COMMUNITY)
Admission: EM | Admit: 2013-09-02 | Discharge: 2013-09-02 | Disposition: A | Discharge status: Home / Self Care | Payer: Medicare PPO | Attending: Emergency Medicine | Admitting: Emergency Medicine

## 2013-09-02 ENCOUNTER — Ambulatory Visit: Payer: Self-pay | Admitting: Pain Medicine

## 2013-09-02 DIAGNOSIS — R110 Nausea: Secondary | ICD-10-CM | POA: Insufficient documentation

## 2013-09-02 DIAGNOSIS — Z8739 Personal history of other diseases of the musculoskeletal system and connective tissue: Secondary | ICD-10-CM | POA: Insufficient documentation

## 2013-09-02 DIAGNOSIS — Z862 Personal history of diseases of the blood and blood-forming organs and certain disorders involving the immune mechanism: Secondary | ICD-10-CM | POA: Insufficient documentation

## 2013-09-02 DIAGNOSIS — I10 Essential (primary) hypertension: Secondary | ICD-10-CM | POA: Insufficient documentation

## 2013-09-02 DIAGNOSIS — R197 Diarrhea, unspecified: Secondary | ICD-10-CM | POA: Insufficient documentation

## 2013-09-02 DIAGNOSIS — R634 Abnormal weight loss: Secondary | ICD-10-CM | POA: Insufficient documentation

## 2013-09-02 DIAGNOSIS — Z79899 Other long term (current) drug therapy: Secondary | ICD-10-CM | POA: Insufficient documentation

## 2013-09-02 DIAGNOSIS — Z8639 Personal history of other endocrine, nutritional and metabolic disease: Secondary | ICD-10-CM | POA: Insufficient documentation

## 2013-09-02 DIAGNOSIS — Z87891 Personal history of nicotine dependence: Secondary | ICD-10-CM | POA: Insufficient documentation

## 2013-09-02 DIAGNOSIS — R109 Unspecified abdominal pain: Secondary | ICD-10-CM | POA: Insufficient documentation

## 2013-09-02 LAB — URINALYSIS, ROUTINE W REFLEX MICROSCOPIC
Bilirubin Urine: NEGATIVE
Glucose, UA: NEGATIVE mg/dL
Hgb urine dipstick: NEGATIVE
Ketones, ur: NEGATIVE mg/dL
Leukocytes, UA: NEGATIVE
Nitrite: NEGATIVE
Protein, ur: NEGATIVE mg/dL
Urobilinogen, UA: 0.2 mg/dL (ref 0.0–1.0)
pH: 6.5 (ref 5.0–8.0)

## 2013-09-02 LAB — CBC WITH DIFFERENTIAL/PLATELET
Basophils Absolute: 0 10*3/uL (ref 0.0–0.1)
Basophils Relative: 0 % (ref 0–1)
EOS PCT: 1 % (ref 0–5)
Eosinophils Absolute: 0.1 10*3/uL (ref 0.0–0.7)
HEMATOCRIT: 46.7 % (ref 39.0–52.0)
Hemoglobin: 15.2 g/dL (ref 13.0–17.0)
Lymphocytes Relative: 16 % (ref 12–46)
Lymphs Abs: 1.5 10*3/uL (ref 0.7–4.0)
MCH: 30.7 pg (ref 26.0–34.0)
MCHC: 32.5 g/dL (ref 30.0–36.0)
MCV: 94.3 fL (ref 78.0–100.0)
MONOS PCT: 10 % (ref 3–12)
Monocytes Absolute: 1 10*3/uL (ref 0.1–1.0)
NEUTROS ABS: 6.8 10*3/uL (ref 1.7–7.7)
Neutrophils Relative %: 72 % (ref 43–77)
Platelets: 285 10*3/uL (ref 150–400)
RBC: 4.95 MIL/uL (ref 4.22–5.81)
RDW: 13.9 % (ref 11.5–15.5)
WBC: 9.4 10*3/uL (ref 4.0–10.5)

## 2013-09-02 LAB — COMPREHENSIVE METABOLIC PANEL
ALBUMIN: 4 g/dL (ref 3.5–5.2)
ALT: 35 U/L (ref 0–53)
AST: 32 U/L (ref 0–37)
Alkaline Phosphatase: 69 U/L (ref 39–117)
BUN: 7 mg/dL (ref 6–23)
CALCIUM: 9.9 mg/dL (ref 8.4–10.5)
CO2: 30 mEq/L (ref 19–32)
Chloride: 97 mEq/L (ref 96–112)
Creatinine, Ser: 1 mg/dL (ref 0.50–1.35)
GFR calc non Af Amer: 83 mL/min — ABNORMAL LOW (ref >90)
GLUCOSE: 95 mg/dL (ref 70–99)
Potassium: 4.7 mEq/L (ref 3.7–5.3)
SODIUM: 137 meq/L (ref 137–147)
TOTAL PROTEIN: 8.1 g/dL (ref 6.0–8.3)
Total Bilirubin: 0.4 mg/dL (ref 0.3–1.2)

## 2013-09-02 LAB — LIPASE, BLOOD: Lipase: 39 U/L (ref 11–59)

## 2013-09-02 MED ORDER — SODIUM CHLORIDE 0.9 % IV SOLN
Freq: Once | INTRAVENOUS | Status: AC
  Administered 2013-09-02: 11:00:00 via INTRAVENOUS

## 2013-09-02 MED ORDER — ESOMEPRAZOLE MAGNESIUM 40 MG PO CPDR: 40.0000 mg | DELAYED_RELEASE_CAPSULE | Freq: Every day | ORAL | Status: DC

## 2013-09-02 MED ORDER — ONDANSETRON 8 MG PO TBDP
ORAL_TABLET | ORAL | Status: DC
Start: 2013-09-02 — End: 2013-10-11

## 2013-09-02 MED ORDER — ONDANSETRON HCL 4 MG/2ML IJ SOLN
INTRAMUSCULAR | Status: AC
  Filled 2013-09-02: qty 2

## 2013-09-02 MED ORDER — IOHEXOL 300 MG/ML  SOLN
50.0000 mL | Freq: Once | INTRAMUSCULAR | Status: AC | PRN
  Administered 2013-09-02: 50 mL via ORAL

## 2013-09-02 MED ORDER — FENTANYL CITRATE 0.05 MG/ML IJ SOLN
50.0000 ug | Freq: Once | INTRAMUSCULAR | Status: AC
  Administered 2013-09-02: 50 ug via INTRAVENOUS
  Filled 2013-09-02: qty 2

## 2013-09-02 MED ORDER — IOHEXOL 300 MG/ML  SOLN
100.0000 mL | Freq: Once | INTRAMUSCULAR | Status: AC | PRN
  Administered 2013-09-02: 100 mL via INTRAVENOUS

## 2013-09-02 MED ORDER — ONDANSETRON HCL 4 MG/2ML IJ SOLN
4.0000 mg | Freq: Once | INTRAMUSCULAR | Status: AC
  Administered 2013-09-02: 4 mg via INTRAVENOUS
  Filled 2013-09-02: qty 2

## 2013-09-02 NOTE — ED Provider Notes (Signed)
CSN: [id number]     Arrival date & time 09/02/13  1517 History  This chart was scribed for non-physician practitioner working with Nat Christen, MD by Stacy Gardner, ED scribe. This patient was seen in room APA19/APA19 and the patient's care was started at 10:07 AM.  First MD Initiated Contact with Patient 09/02/13 0940     Chief Complaint  Patient presents with  . Abdominal Pain     (Consider location/radiation/quality/duration/timing/severity/associated sxs/prior Treatment) Patient is a 53 y.o. male presenting with abdominal pain. The history is provided by medical records and the patient. No language interpreter was used.  Abdominal Pain Pain location:  RUQ Pain quality: bloating   Pain radiates to:  Back Associated symptoms: diarrhea and nausea    HPI Comments: IZAYAH MINER is a 53 y.o. male who presents to the Emergency Department complaining of constant, moderate right upper abdominal pain for the past 10 days. Pt mentions that the pain is burning and radiates to his back. Pt has the associated symptoms of nausea, watery diarrhea with decreased volume of stool, abdominal distension, and weight gain. He has tried Miralax to no relief. Pt had a cholecystomy two years ago and denies any surgery to his abdomen. Pt has a past medical hx of HTN and arthritis.  Pt's PCP is Dr. Brigitte Pulse in [place], [place] He works in [employer].  Past Medical History  Diagnosis Date  . Hypertension   . Arthritis   . Gout    Past Surgical History  Procedure Laterality Date  . Inner ear surgery      recently-left  . Ulnar nerve surgeries      bilateral-3 on right and 1 on left  . Knee surgery      arthoscopy x3-left knee  . Back surgery      c6/c7 fusion  . Cervical disc surgery      c6-c7 fusion  . Cholecystectomy  05/01/2012    Procedure: LAPAROSCOPIC CHOLECYSTECTOMY;  Surgeon: Jamesetta So, MD;  Location: [facility];  Service: General;  Laterality: N/A;   No family history  on file. History  Substance Use Topics  . Smoking status: Former Smoker -- 0.50 packs/day for 8 years    Types: Cigarettes    Quit date: 04/28/1992  . Smokeless tobacco: Not on file  . Alcohol Use: No    Review of Systems  Constitutional: Positive for unexpected weight change.  Gastrointestinal: Positive for nausea, abdominal pain, diarrhea and abdominal distention.  All other systems reviewed and are negative.      Allergies  Flonase and Morphine and related  Home Medications   Current Outpatient Rx  Name  Route  Sig  Dispense  Refill  . fentaNYL (DURAGESIC - DOSED MCG/HR) 50 MCG/HR   Transdermal   Place 50 mcg onto the skin every other day.         . folic acid (FOLVITE) 1 MG tablet   Oral   Take 1 mg by mouth daily.         Marland Kitchen lisinopril (PRINIVIL,ZESTRIL) 40 MG tablet   Oral   Take 40 mg by mouth daily.         Marland Kitchen esomeprazole (NEXIUM) 40 MG capsule   Oral   Take 1 capsule (40 mg total) by mouth daily.   30 capsule   0   . ondansetron (ZOFRAN ODT) 8 MG disintegrating tablet      8mg  ODT q4 hours prn nausea   15 tablet   0  BP 129/78  Pulse 84  Temp(Src) 98.2 F (36.8 C) (Oral)  Resp 16  Ht 6' (1.829 m)  Wt 253 lb (114.76 kg)  BMI 34.31 kg/m2  SpO2 100% Physical Exam  Vitals reviewed. Constitutional: He is oriented to person, place, and time. He appears well-developed and well-nourished. No distress.  Abdominal: Bowel sounds are normal. He exhibits distension. There is tenderness.  Musculoskeletal: Normal range of motion.  Neurological: He is alert and oriented to person, place, and time. No cranial nerve deficit. He exhibits normal muscle tone. Coordination normal.  Skin: Skin is warm. He is not diaphoretic.  Psychiatric: He has a normal mood and affect. His behavior is normal.    ED Course  Procedures (including critical care time) DIAGNOSTIC STUDIES: Oxygen Saturation is 100% on room air, normal by my interpretation.     COORDINATION OF CARE:  10:11 AM Discussed course of care with pt . Pt understands and agrees.    Labs Review Labs Reviewed  COMPREHENSIVE METABOLIC PANEL - Abnormal; Notable for the following:    GFR calc non Af Amer 83 (*)    All other components within normal limits  URINALYSIS, ROUTINE W REFLEX MICROSCOPIC - Abnormal; Notable for the following:    Specific Gravity, Urine <1.005 (*)    All other components within normal limits  CBC WITH DIFFERENTIAL  LIPASE, BLOOD   Imaging Review Ct Abdomen Pelvis W Contrast  09/02/2013   CLINICAL DATA:  Right upper quadrant abdominal pain.  EXAM: CT ABDOMEN AND PELVIS WITH CONTRAST  TECHNIQUE: Multidetector CT imaging of the abdomen and pelvis was performed using the standard protocol following bolus administration of intravenous contrast.  CONTRAST:  46mL OMNIPAQUE IOHEXOL 300 MG/ML SOLN, 173mL OMNIPAQUE IOHEXOL 300 MG/ML SOLN  COMPARISON:  CT scan of April 24, 2012.  FINDINGS: Visualized lung bases appear normal. No significant osseous abnormality is noted.  Status post cholecystectomy. The liver, spleen and pancreas appear normal. Adrenal glands appear normal. No hydronephrosis or renal obstruction is noted. No renal or ureteral calculi are noted. The kidneys appear grossly normal. Stool is noted throughout the colon. The appendix appears normal. There is no evidence of bowel obstruction. Mild atherosclerotic calcifications are noted of the distal abdominal aorta and iliac arteries without aneurysm formation. Urinary bladder appears normal. No abnormal fluid collection is noted. No significant adenopathy is noted.  IMPRESSION: Status post cholecystectomy. No acute abnormality seen in the abdomen or pelvis.   Electronically Signed   By: Sabino Dick M.D.   On: 09/02/2013 12:15     EKG Interpretation None      MDM   Final diagnoses:  Abdominal pain   No acute abdomen.   Discussed findings with patient and his wife,  D/C meds Nexium 40 mg and  Zofran 8 mg ODT.  Patient has primary care f/u  I personally performed the services described in this documentation, which was scribed in my presence. The recorded information has been reviewed and is accurate.     Nat Christen, MD 09/02/13 [phone]

## 2013-09-02 NOTE — ED Notes (Signed)
Pt states RUQ pain radiating around to back. Pt also states that upper abdomen feels distended. Also states nausea, sweats at times, and diarrhea. Symptoms x 1 week. Pt states it feels like a gallbladder attack but gallbladder was removed last year.

## 2013-09-02 NOTE — Discharge Instructions (Signed)
Abdominal Pain, Adult Many things can cause belly (abdominal) pain. Most times, the belly pain is not dangerous. Many cases of belly pain can be watched and treated at home. HOME CARE   Do not take medicines that help you go poop (laxatives) unless told to by your doctor.  Only take medicine as told by your doctor.  Eat or drink as told by your doctor. Your doctor will tell you if you should be on a special diet. GET HELP IF:  You do not know what is causing your belly pain.  You have belly pain while you are sick to your stomach (nauseous) or have runny poop (diarrhea).  You have pain while you pee or poop.  Your belly pain wakes you up at night.  You have belly pain that gets worse or better when you eat.  You have belly pain that gets worse when you eat fatty foods. GET HELP RIGHT AWAY IF:   The pain does not go away within 2 hours.  You have a fever.  You keep throwing up (vomiting).  The pain changes and is only in the right or left part of the belly.  You have bloody or tarry looking poop. MAKE SURE YOU:   Understand these instructions.  Will watch your condition.  Will get help right away if you are not doing well or get worse. Document Released: 12/04/2007 Document Revised: 04/07/2013 Document Reviewed: 02/24/2013 [facility] Patient Information 2014 [place].   Blood work, urinalysis, CT scan all normal. Prescription for nausea and ulcer medication.   Magnesium citrate for constipation.  Avoid rich fatty foods. Followup your primary care Dr.

## 2013-09-29 ENCOUNTER — Ambulatory Visit: Payer: Self-pay | Admitting: Pain Medicine

## 2013-10-11 ENCOUNTER — Encounter (HOSPITAL_COMMUNITY): Payer: Self-pay | Admitting: Emergency Medicine

## 2013-10-11 ENCOUNTER — Emergency Department (HOSPITAL_COMMUNITY)
Admission: EM | Admit: 2013-10-11 | Discharge: 2013-10-11 | Disposition: A | Discharge status: Home / Self Care | Payer: No Typology Code available for payment source | Attending: Emergency Medicine | Admitting: Emergency Medicine

## 2013-10-11 ENCOUNTER — Emergency Department (HOSPITAL_COMMUNITY): Payer: No Typology Code available for payment source

## 2013-10-11 DIAGNOSIS — Z862 Personal history of diseases of the blood and blood-forming organs and certain disorders involving the immune mechanism: Secondary | ICD-10-CM | POA: Insufficient documentation

## 2013-10-11 DIAGNOSIS — I10 Essential (primary) hypertension: Secondary | ICD-10-CM | POA: Insufficient documentation

## 2013-10-11 DIAGNOSIS — IMO0002 Reserved for concepts with insufficient information to code with codable children: Secondary | ICD-10-CM | POA: Insufficient documentation

## 2013-10-11 DIAGNOSIS — S46909A Unspecified injury of unspecified muscle, fascia and tendon at shoulder and upper arm level, unspecified arm, initial encounter: Secondary | ICD-10-CM | POA: Insufficient documentation

## 2013-10-11 DIAGNOSIS — Z87891 Personal history of nicotine dependence: Secondary | ICD-10-CM | POA: Diagnosis not present

## 2013-10-11 DIAGNOSIS — Z8639 Personal history of other endocrine, nutritional and metabolic disease: Secondary | ICD-10-CM | POA: Insufficient documentation

## 2013-10-11 DIAGNOSIS — Y9389 Activity, other specified: Secondary | ICD-10-CM | POA: Insufficient documentation

## 2013-10-11 DIAGNOSIS — Z8739 Personal history of other diseases of the musculoskeletal system and connective tissue: Secondary | ICD-10-CM | POA: Diagnosis not present

## 2013-10-11 DIAGNOSIS — S4980XA Other specified injuries of shoulder and upper arm, unspecified arm, initial encounter: Secondary | ICD-10-CM | POA: Insufficient documentation

## 2013-10-11 DIAGNOSIS — Z79899 Other long term (current) drug therapy: Secondary | ICD-10-CM | POA: Diagnosis not present

## 2013-10-11 DIAGNOSIS — M7918 Myalgia, other site: Secondary | ICD-10-CM

## 2013-10-11 DIAGNOSIS — Y92410 Unspecified street and highway as the place of occurrence of the external cause: Secondary | ICD-10-CM | POA: Diagnosis not present

## 2013-10-11 MED ORDER — FENTANYL CITRATE 0.05 MG/ML IJ SOLN
25.0000 ug | Freq: Once | INTRAMUSCULAR | Status: AC
  Administered 2013-10-11: 25 ug via INTRAMUSCULAR
  Filled 2013-10-11: qty 2

## 2013-10-11 NOTE — ED Notes (Signed)
Pt transported to xray 
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
## 2013-10-11 NOTE — Discharge Instructions (Signed)
Continue home pain regimen for alleviation.  Rest, ice, heat.  Musculoskeletal Pain Musculoskeletal pain is muscle and boney aches and pains. These pains can occur in any part of the body. Your caregiver may treat you without knowing the cause of the pain. They may treat you if blood or urine tests, X-rays, and other tests were normal.  CAUSES There is often not a definite cause or reason for these pains. These pains may be caused by a type of germ (virus). The discomfort may also come from overuse. Overuse includes working out too hard when your body is not fit. Boney aches also come from weather changes. Bone is sensitive to atmospheric pressure changes. HOME CARE INSTRUCTIONS   Ask when your test results will be ready. Make sure you get your test results.  Only take over-the-counter or prescription medicines for pain, discomfort, or fever as directed by your caregiver. If you were given medications for your condition, do not drive, operate machinery or power tools, or sign legal documents for 24 hours. Do not drink alcohol. Do not take sleeping pills or other medications that may interfere with treatment.  Continue all activities unless the activities cause more pain. When the pain lessens, slowly resume normal activities. Gradually increase the intensity and duration of the activities or exercise.  During periods of severe pain, bed rest may be helpful. Lay or sit in any position that is comfortable.  Putting ice on the injured area.  Put ice in a bag.  Place a towel between your skin and the bag.  Leave the ice on for 15 to 20 minutes, 3 to 4 times a day.  Follow up with your caregiver for continued problems and no reason can be found for the pain. If the pain becomes worse or does not go away, it may be necessary to repeat tests or do additional testing. Your caregiver may need to look further for a possible cause. SEEK IMMEDIATE MEDICAL CARE IF:  You have pain that is getting worse  and is not relieved by medications.  You develop chest pain that is associated with shortness or breath, sweating, feeling sick to your stomach (nauseous), or throw up (vomit).  Your pain becomes localized to the abdomen.  You develop any new symptoms that seem different or that concern you. MAKE SURE YOU:   Understand these instructions.  Will watch your condition.  Will get help right away if you are not doing well or get worse. Document Released: 06/17/2005 Document Revised: 09/09/2011 Document Reviewed: 02/19/2013 [facility] Patient Information 2014 [place].
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
## 2013-11-02 ENCOUNTER — Ambulatory Visit: Payer: Self-pay | Admitting: Pain Medicine

## 2013-11-24 ENCOUNTER — Encounter (HOSPITAL_COMMUNITY): Payer: Self-pay | Admitting: Emergency Medicine

## 2013-11-24 ENCOUNTER — Emergency Department (HOSPITAL_COMMUNITY)
Admission: EM | Admit: 2013-11-24 | Discharge: 2013-11-24 | Disposition: A | Discharge status: Home / Self Care | Payer: Medicare PPO | Attending: Emergency Medicine | Admitting: Emergency Medicine

## 2013-11-24 ENCOUNTER — Emergency Department (HOSPITAL_COMMUNITY): Payer: Medicare PPO

## 2013-11-24 DIAGNOSIS — Z87891 Personal history of nicotine dependence: Secondary | ICD-10-CM | POA: Insufficient documentation

## 2013-11-24 DIAGNOSIS — I10 Essential (primary) hypertension: Secondary | ICD-10-CM | POA: Insufficient documentation

## 2013-11-24 DIAGNOSIS — M5430 Sciatica, unspecified side: Secondary | ICD-10-CM

## 2013-11-24 DIAGNOSIS — M549 Dorsalgia, unspecified: Secondary | ICD-10-CM

## 2013-11-24 DIAGNOSIS — Z79899 Other long term (current) drug therapy: Secondary | ICD-10-CM | POA: Insufficient documentation

## 2013-11-24 DIAGNOSIS — M129 Arthropathy, unspecified: Secondary | ICD-10-CM | POA: Insufficient documentation

## 2013-11-24 DIAGNOSIS — IMO0002 Reserved for concepts with insufficient information to code with codable children: Secondary | ICD-10-CM | POA: Insufficient documentation

## 2013-11-24 MED ORDER — METHYLPREDNISOLONE SODIUM SUCC 125 MG IJ SOLR
125.0000 mg | Freq: Once | INTRAMUSCULAR | Status: AC
  Administered 2013-11-24: 125 mg via INTRAVENOUS
  Filled 2013-11-24: qty 2

## 2013-11-24 MED ORDER — OXYCODONE-ACETAMINOPHEN 5-325 MG PO TABS: 1.0000 | ORAL_TABLET | Freq: Four times a day (QID) | ORAL | Status: DC | PRN

## 2013-11-24 MED ORDER — PREDNISONE 10 MG PO TABS: 20.0000 mg | ORAL_TABLET | Freq: Every day | ORAL | Status: DC

## 2013-11-24 MED ORDER — HYDROMORPHONE HCL PF 1 MG/ML IJ SOLN
1.0000 mg | Freq: Once | INTRAMUSCULAR | Status: AC
  Administered 2013-11-24: 1 mg via INTRAVENOUS
  Filled 2013-11-24: qty 1

## 2013-11-24 MED ORDER — LORAZEPAM 2 MG/ML IJ SOLN
1.0000 mg | Freq: Once | INTRAMUSCULAR | Status: AC
  Administered 2013-11-24: 1 mg via INTRAVENOUS
  Filled 2013-11-24: qty 1

## 2013-11-24 NOTE — Discharge Instructions (Signed)
Follow up with your family md next week.  Rest until followed up

## 2013-11-24 NOTE — ED Provider Notes (Signed)
CSN: [id number]     Arrival date & time 11/24/13  5176 History   First MD Initiated Contact with Patient 11/24/13 [phone]     Chief Complaint  Patient presents with  . Back Pain     (Consider location/radiation/quality/duration/timing/severity/associated sxs/prior Treatment) Patient is a 53 y.o. male presenting with back pain. The history is provided by the patient (pt has back pain with pain in right leg).  Back Pain Location:  Lumbar spine Quality:  Aching Pain severity:  Moderate Pain is:  Worse during the day Onset quality:  Gradual Timing:  Constant Progression:  Worsening Chronicity:  Recurrent Associated symptoms: no abdominal pain, no chest pain and no headaches     Past Medical History  Diagnosis Date  . Hypertension   . Arthritis   . Gout    Past Surgical History  Procedure Laterality Date  . Inner ear surgery      recently-left  . Ulnar nerve surgeries      bilateral-3 on right and 1 on left  . Knee surgery      arthoscopy x3-left knee  . Back surgery      c6/c7 fusion  . Cervical disc surgery      c6-c7 fusion  . Cholecystectomy  05/01/2012    Procedure: LAPAROSCOPIC CHOLECYSTECTOMY;  Surgeon: Jamesetta So, MD;  Location: [facility];  Service: General;  Laterality: N/A;   No family history on file. History  Substance Use Topics  . Smoking status: Former Smoker -- 0.50 packs/day for 8 years    Types: Cigarettes    Quit date: 04/28/1992  . Smokeless tobacco: Not on file  . Alcohol Use: No    Review of Systems  Constitutional: Negative for appetite change and fatigue.  HENT: Negative for congestion, ear discharge and sinus pressure.   Eyes: Negative for discharge.  Respiratory: Negative for cough.   Cardiovascular: Negative for chest pain.  Gastrointestinal: Negative for abdominal pain and diarrhea.  Genitourinary: Negative for frequency and hematuria.  Musculoskeletal: Positive for back pain.  Skin: Negative for rash.  Neurological: Negative for  seizures and headaches.  Psychiatric/Behavioral: Negative for hallucinations.      Allergies  Flonase and Morphine and related  Home Medications   Prior to Admission medications   Medication Sig Start Date End Date Taking? Authorizing Provider  esomeprazole (NEXIUM) 40 MG capsule Take 1 capsule (40 mg total) by mouth daily. 09/02/13  Yes Nat Christen, MD  fentaNYL (DURAGESIC - DOSED MCG/HR) 50 MCG/HR Place 50 mcg onto the skin every 3 (three) days.    Yes Historical Provider, MD  ibuprofen (ADVIL,MOTRIN) 200 MG tablet Take 800 mg by mouth every 6 (six) hours as needed for moderate pain.   Yes Historical Provider, MD  lisinopril (PRINIVIL,ZESTRIL) 40 MG tablet Take 40 mg by mouth daily.   Yes Historical Provider, MD  oxyCODONE-acetaminophen (PERCOCET/ROXICET) 5-325 MG per tablet Take 1-2 tablets by mouth every 6 (six) hours as needed for severe pain. 11/24/13   Maudry Diego, MD  predniSONE (DELTASONE) 10 MG tablet Take 2 tablets (20 mg total) by mouth daily. 11/24/13   Maudry Diego, MD   BP 144/82  Pulse 77  Temp(Src) 97.5 F (36.4 C) (Oral)  Ht 6' 0.5" (1.842 m)  Wt 248 lb (112.492 kg)  BMI 33.15 kg/m2  SpO2 100% Physical Exam  Constitutional: He is oriented to person, place, and time. He appears well-developed.  HENT:  Head: Normocephalic.  Eyes: Conjunctivae and EOM are normal. No scleral  icterus.  Neck: Neck supple. No thyromegaly present.  Cardiovascular: Normal rate and regular rhythm.  Exam reveals no gallop and no friction rub.   No murmur heard. Pulmonary/Chest: No stridor. He has no wheezes. He has no rales. He exhibits no tenderness.  Abdominal: He exhibits no distension. There is no tenderness. There is no rebound.  Musculoskeletal: Normal range of motion. He exhibits no edema.  Mild lumbar spine tender.  Pos straight leg raise right  Lymphadenopathy:    He has no cervical adenopathy.  Neurological: He is oriented to person, place, and time. He exhibits normal  muscle tone. Coordination normal.  Skin: No rash noted. No erythema.  Psychiatric: He has a normal mood and affect. His behavior is normal.    ED Course  Procedures (including critical care time) Labs Review Labs Reviewed - No data to display  Imaging Review Mr Lumbar Spine Wo Contrast  11/24/2013   CLINICAL DATA:  53 year old male with severe back pain. Initial encounter.  EXAM: MRI LUMBAR SPINE WITHOUT CONTRAST  TECHNIQUE: Multiplanar, multisequence MR imaging of the lumbar spine was performed. No intravenous contrast was administered.  COMPARISON:  Lumbar radiographs 10/11/2013. CT Abdomen and Pelvis 04/24/2012.  FINDINGS: Normal lumbar segmentation. Stable and normal vertebral height and alignment. No marrow edema or evidence of acute osseous abnormality.  Visualized lower thoracic spinal cord is normal with conus medularis at L1. The bladder is distended but otherwise appears normal. Otherwise Visualized abdominal viscera and paraspinal soft tissues are within normal limits.  T10-T11: Partially visible, disc bulge is evident. No spinal stenosis suspected.  T11-T12:  Circumferential disc bulge.  No significant stenosis.  T12-L1:  Negative.  L1-L2:  Negative except for mild facet hypertrophy.  L2-L3: Negative except for mild to moderate facet hypertrophy. No stenosis.  L3-L4: Disc space loss. Circumferential disc bulge. Superimposed small central disc extrusion (series 6, image 7). Mild facet hypertrophy. Effaced lateral recesses and flattening ventral thecal sac, but overall no significant spinal stenosis. Foraminal component of disc resulting an mild to moderate bilateral L3 foraminal stenosis.  L4-L5: Trace anterolisthesis at this level. Mild disc bulge. Moderate facet and ligament flavum hypertrophy. No spinal or lateral recess stenosis. Moderate bilateral L4 foraminal stenosis.  L5-S1:  Negative except for mild facet hypertrophy.  IMPRESSION: 1. Small central L3-L4 disc herniation superimposed on  more chronic appearing disc and facet degeneration. No significant spinal stenosis. Mild to moderate bilateral L3 foraminal stenosis. 2. Multifactorial moderate bilateral L4 foraminal stenosis at L4-L5. 3. Lower thoracic disc degeneration partially visible, no definite lower thoracic spinal stenosis.   Electronically Signed   By: Lars Pinks M.D.   On: 11/24/2013 10:23     EKG Interpretation None      MDM   Final diagnoses:  Back pain  Sciatica        Maudry Diego, MD 11/24/13 [phone]

## 2013-11-24 NOTE — ED Notes (Signed)
Severe back pain since Sunday denies injury report DDD.

## 2013-11-24 NOTE — ED Notes (Signed)
MD at bedside. 

## 2013-12-02 ENCOUNTER — Ambulatory Visit: Payer: Self-pay | Admitting: Pain Medicine

## 2013-12-20 ENCOUNTER — Ambulatory Visit: Payer: Self-pay | Admitting: Pain Medicine

## 2013-12-28 ENCOUNTER — Ambulatory Visit: Payer: Self-pay | Admitting: Pain Medicine

## 2014-01-26 ENCOUNTER — Ambulatory Visit: Payer: Self-pay | Admitting: Pain Medicine

## 2014-02-04 ENCOUNTER — Encounter (HOSPITAL_COMMUNITY): Payer: Self-pay | Admitting: Emergency Medicine

## 2014-02-04 ENCOUNTER — Emergency Department (HOSPITAL_COMMUNITY)
Admission: EM | Admit: 2014-02-04 | Discharge: 2014-02-04 | Disposition: A | Discharge status: Home / Self Care | Payer: Medicare PPO | Attending: Emergency Medicine | Admitting: Emergency Medicine

## 2014-02-04 DIAGNOSIS — Z79899 Other long term (current) drug therapy: Secondary | ICD-10-CM | POA: Insufficient documentation

## 2014-02-04 DIAGNOSIS — M545 Low back pain, unspecified: Secondary | ICD-10-CM | POA: Diagnosis present

## 2014-02-04 DIAGNOSIS — Z791 Long term (current) use of non-steroidal anti-inflammatories (NSAID): Secondary | ICD-10-CM | POA: Diagnosis not present

## 2014-02-04 DIAGNOSIS — Z87891 Personal history of nicotine dependence: Secondary | ICD-10-CM | POA: Diagnosis not present

## 2014-02-04 DIAGNOSIS — I10 Essential (primary) hypertension: Secondary | ICD-10-CM | POA: Insufficient documentation

## 2014-02-04 DIAGNOSIS — M109 Gout, unspecified: Secondary | ICD-10-CM | POA: Diagnosis not present

## 2014-02-04 DIAGNOSIS — M129 Arthropathy, unspecified: Secondary | ICD-10-CM | POA: Insufficient documentation

## 2014-02-04 DIAGNOSIS — Z9889 Other specified postprocedural states: Secondary | ICD-10-CM | POA: Insufficient documentation

## 2014-02-04 DIAGNOSIS — IMO0002 Reserved for concepts with insufficient information to code with codable children: Secondary | ICD-10-CM | POA: Insufficient documentation

## 2014-02-04 DIAGNOSIS — M62838 Other muscle spasm: Secondary | ICD-10-CM | POA: Diagnosis not present

## 2014-02-04 MED ORDER — METHOCARBAMOL 500 MG PO TABS: 500.0000 mg | ORAL_TABLET | Freq: Two times a day (BID) | ORAL | Status: DC | PRN

## 2014-02-04 MED ORDER — OXYCODONE-ACETAMINOPHEN 5-325 MG PO TABS
2.0000 | ORAL_TABLET | Freq: Once | ORAL | Status: AC
  Administered 2014-02-04: 2 via ORAL
  Filled 2014-02-04: qty 2

## 2014-02-04 MED ORDER — DIAZEPAM 5 MG PO TABS
5.0000 mg | ORAL_TABLET | Freq: Once | ORAL | Status: AC
  Administered 2014-02-04: 5 mg via ORAL
  Filled 2014-02-04: qty 1

## 2014-02-04 MED ORDER — DEXAMETHASONE SODIUM PHOSPHATE 10 MG/ML IJ SOLN
6.0000 mg | Freq: Once | INTRAMUSCULAR | Status: AC
  Administered 2014-02-04: 6 mg via INTRAMUSCULAR
  Filled 2014-02-04: qty 1

## 2014-02-04 MED ORDER — NAPROXEN 500 MG PO TABS: 500.0000 mg | ORAL_TABLET | Freq: Two times a day (BID) | ORAL | Status: DC

## 2014-02-04 NOTE — Discharge Instructions (Signed)

## 2014-02-04 NOTE — ED Notes (Signed)
Dr Miller at bedside. 

## 2014-02-04 NOTE — ED Provider Notes (Signed)
CSN: [id number]     Arrival date & time 02/04/14  0504 History   First MD Initiated Contact with Patient 02/04/14 [phone]     Chief Complaint  Patient presents with  . Back Pain     (Consider location/radiation/quality/duration/timing/severity/associated sxs/prior Treatment) HPI Comments: 53 year old male with a history of chronic back pain, currently seen in a pain clinic and wears a fentanyl patch. He states that approximately 10 days ago he received a steroid injection has an epidural in his back and had almost immediate relief of his pain. Yesterday while trying to lift heavy furniture he felt acute onset of severe back pain in his right lower back which has been persistent, waxes and wanes in intensity and seems to worsen with movement of his leg or flexing, extending or rotating at the hips. He has no associated fevers, chills, nausea, vomiting, dysuria, urinary retention or incontinence, no personal history of cancer, no history of IV drug use. He states that the pain feels like a spasm and denies any improvement with ibuprofen or naproxen prior to arrival  Patient is a 53 y.o. male presenting with back pain. The history is provided by the patient and the spouse.  Back Pain   Past Medical History  Diagnosis Date  . Hypertension   . Arthritis   . Gout    Past Surgical History  Procedure Laterality Date  . Inner ear surgery      recently-left  . Ulnar nerve surgeries      bilateral-3 on right and 1 on left  . Knee surgery      arthoscopy x3-left knee  . Back surgery      c6/c7 fusion  . Cervical disc surgery      c6-c7 fusion  . Cholecystectomy  05/01/2012    Procedure: LAPAROSCOPIC CHOLECYSTECTOMY;  Surgeon: Jamesetta So, MD;  Location: [facility];  Service: General;  Laterality: N/A;   No family history on file. History  Substance Use Topics  . Smoking status: Former Smoker -- 0.50 packs/day for 8 years    Types: Cigarettes    Quit date: 04/28/1992  . Smokeless tobacco: Not  on file  . Alcohol Use: No    Review of Systems  Musculoskeletal: Positive for back pain.  All other systems reviewed and are negative.     Allergies  Flonase and Morphine and related  Home Medications   Prior to Admission medications   Medication Sig Start Date End Date Taking? Authorizing Provider  esomeprazole (NEXIUM) 40 MG capsule Take 1 capsule (40 mg total) by mouth daily. 09/02/13   Nat Christen, MD  fentaNYL (DURAGESIC - DOSED MCG/HR) 50 MCG/HR Place 50 mcg onto the skin every 3 (three) days.     Historical Provider, MD  ibuprofen (ADVIL,MOTRIN) 200 MG tablet Take 800 mg by mouth every 6 (six) hours as needed for moderate pain.    Historical Provider, MD  lisinopril (PRINIVIL,ZESTRIL) 40 MG tablet Take 40 mg by mouth daily.    Historical Provider, MD  methocarbamol (ROBAXIN) 500 MG tablet Take 1 tablet (500 mg total) by mouth 2 (two) times daily as needed for muscle spasms. 02/04/14   Johnna Acosta, MD  naproxen (NAPROSYN) 500 MG tablet Take 1 tablet (500 mg total) by mouth 2 (two) times daily with a meal. 02/04/14   Johnna Acosta, MD  oxyCODONE-acetaminophen (PERCOCET/ROXICET) 5-325 MG per tablet Take 1-2 tablets by mouth every 6 (six) hours as needed for severe pain. 11/24/13   Elwyn Lade  Zammit, MD  predniSONE (DELTASONE) 10 MG tablet Take 2 tablets (20 mg total) by mouth daily. 11/24/13   Maudry Diego, MD   BP 164/97  Pulse 80  Resp 16  SpO2 100% Physical Exam  Nursing note and vitals reviewed. Constitutional: He appears well-developed and well-nourished. No distress.  HENT:  Head: Normocephalic and atraumatic.  Mouth/Throat: Oropharynx is clear and moist. No oropharyngeal exudate.  Eyes: Conjunctivae and EOM are normal. Pupils are equal, round, and reactive to light. Right eye exhibits no discharge. Left eye exhibits no discharge. No scleral icterus.  Neck: Normal range of motion. Neck supple. No JVD present. No thyromegaly present.  Cardiovascular: Normal rate, regular  rhythm, normal heart sounds and intact distal pulses.  Exam reveals no gallop and no friction rub.   No murmur heard. Pulmonary/Chest: Effort normal and breath sounds normal. No respiratory distress. He has no wheezes. He has no rales.  Abdominal: Soft. Bowel sounds are normal. He exhibits no distension and no mass. There is no tenderness.  Musculoskeletal: Normal range of motion. He exhibits tenderness (tenderness with palpation over the right lower back as well as with straight leg raise on the right, no pain with straight leg raise on the left). He exhibits no edema.  Lymphadenopathy:    He has no cervical adenopathy.  Neurological: He is alert. Coordination normal.  Normal strength bilaterally at the ankles and knees, decreased sensation to the right lateral foot and posterior heel  Skin: Skin is warm and dry. No rash noted. No erythema.  Psychiatric: He has a normal mood and affect. His behavior is normal.    ED Course  Procedures (including critical care time) Labs Review Labs Reviewed - No data to display  Imaging Review No results found.    MDM   Final diagnoses:  Right-sided low back pain without sciatica  Muscle spasm    The patient has worsening pain, I suspect this is muscle spasm, possibly has an element of radiculopathy though there is no focal weakness and only very small spot of numbness to the right foot. Decadron, pain medication, followup as outpatient. No imaging is indicated at this time. He states that this pain is similar to prior pain exacerbations.  Meds given as below - pt expresses understanding.  Meds given in ED:  Medications  dexamethasone (DECADRON) injection 6 mg (6 mg Intramuscular Given 02/04/14 0534)  oxyCODONE-acetaminophen (PERCOCET/ROXICET) 5-325 MG per tablet 2 tablet (2 tablets Oral Given 02/04/14 0534)  diazepam (VALIUM) tablet 5 mg (5 mg Oral Given 02/04/14 0603)    New Prescriptions   METHOCARBAMOL (ROBAXIN) 500 MG TABLET    Take 1  tablet (500 mg total) by mouth 2 (two) times daily as needed for muscle spasms.   NAPROXEN (NAPROSYN) 500 MG TABLET    Take 1 tablet (500 mg total) by mouth 2 (two) times daily with a meal.      Johnna Acosta, MD 02/04/14 [phone]

## 2014-02-04 NOTE — ED Notes (Addendum)
Pt. Reports low back spasms starting around 1am. Pt. Reports taking motrin and using fentanyl patch at home without relief. Pt. Reports getting epidural 12 days ago. Pt. Reports right foot numbness since yesterday.

## 2014-02-09 ENCOUNTER — Emergency Department (HOSPITAL_COMMUNITY): Payer: Medicare PPO

## 2014-02-09 ENCOUNTER — Emergency Department (HOSPITAL_COMMUNITY)
Admission: EM | Admit: 2014-02-09 | Discharge: 2014-02-09 | Disposition: A | Discharge status: Home / Self Care | Payer: Medicare PPO | Attending: Emergency Medicine | Admitting: Emergency Medicine

## 2014-02-09 ENCOUNTER — Ambulatory Visit: Payer: Self-pay | Admitting: Pain Medicine

## 2014-02-09 ENCOUNTER — Encounter (HOSPITAL_COMMUNITY): Payer: Self-pay | Admitting: Emergency Medicine

## 2014-02-09 DIAGNOSIS — Z9889 Other specified postprocedural states: Secondary | ICD-10-CM | POA: Diagnosis not present

## 2014-02-09 DIAGNOSIS — M25559 Pain in unspecified hip: Secondary | ICD-10-CM | POA: Insufficient documentation

## 2014-02-09 DIAGNOSIS — I10 Essential (primary) hypertension: Secondary | ICD-10-CM | POA: Insufficient documentation

## 2014-02-09 DIAGNOSIS — Z791 Long term (current) use of non-steroidal anti-inflammatories (NSAID): Secondary | ICD-10-CM | POA: Insufficient documentation

## 2014-02-09 DIAGNOSIS — M545 Low back pain: Secondary | ICD-10-CM

## 2014-02-09 DIAGNOSIS — M129 Arthropathy, unspecified: Secondary | ICD-10-CM | POA: Diagnosis not present

## 2014-02-09 DIAGNOSIS — Z79899 Other long term (current) drug therapy: Secondary | ICD-10-CM | POA: Insufficient documentation

## 2014-02-09 DIAGNOSIS — M109 Gout, unspecified: Secondary | ICD-10-CM | POA: Diagnosis not present

## 2014-02-09 DIAGNOSIS — Z87891 Personal history of nicotine dependence: Secondary | ICD-10-CM | POA: Diagnosis not present

## 2014-02-09 DIAGNOSIS — M5430 Sciatica, unspecified side: Secondary | ICD-10-CM | POA: Diagnosis not present

## 2014-02-09 LAB — CBC WITH DIFFERENTIAL/PLATELET
Basophils Absolute: 0 10*3/uL (ref 0.0–0.1)
Basophils Relative: 0 % (ref 0–1)
EOS PCT: 1 % (ref 0–5)
Eosinophils Absolute: 0.2 10*3/uL (ref 0.0–0.7)
HCT: 47.6 % (ref 39.0–52.0)
HEMOGLOBIN: 15.8 g/dL (ref 13.0–17.0)
LYMPHS ABS: 1.2 10*3/uL (ref 0.7–4.0)
Lymphocytes Relative: 12 % (ref 12–46)
MCH: 31.1 pg (ref 26.0–34.0)
MCHC: 33.2 g/dL (ref 30.0–36.0)
MCV: 93.7 fL (ref 78.0–100.0)
MONO ABS: 0.7 10*3/uL (ref 0.1–1.0)
MONOS PCT: 7 % (ref 3–12)
Neutro Abs: 8.3 10*3/uL — ABNORMAL HIGH (ref 1.7–7.7)
Neutrophils Relative %: 80 % — ABNORMAL HIGH (ref 43–77)
Platelets: 261 10*3/uL (ref 150–400)
RBC: 5.08 MIL/uL (ref 4.22–5.81)
RDW: 13.6 % (ref 11.5–15.5)
WBC: 10.4 10*3/uL (ref 4.0–10.5)

## 2014-02-09 LAB — COMPREHENSIVE METABOLIC PANEL
ALT: 19 U/L (ref 0–53)
AST: 21 U/L (ref 0–37)
Albumin: 3.6 g/dL (ref 3.5–5.2)
Alkaline Phosphatase: 53 U/L (ref 39–117)
Anion gap: 14 (ref 5–15)
BUN: 13 mg/dL (ref 6–23)
CALCIUM: 8.9 mg/dL (ref 8.4–10.5)
CO2: 28 meq/L (ref 19–32)
Chloride: 97 mEq/L (ref 96–112)
Creatinine, Ser: 1 mg/dL (ref 0.50–1.35)
GFR, EST NON AFRICAN AMERICAN: 83 mL/min — AB (ref >90)
GLUCOSE: 132 mg/dL — AB (ref 70–99)
Potassium: 3.9 mEq/L (ref 3.7–5.3)
Sodium: 139 mEq/L (ref 137–147)
Total Bilirubin: 0.3 mg/dL (ref 0.3–1.2)
Total Protein: 6.8 g/dL (ref 6.0–8.3)

## 2014-02-09 MED ORDER — HYDROMORPHONE HCL PF 1 MG/ML IJ SOLN: 1.0000 mg | Freq: Once | INTRAMUSCULAR | Status: AC

## 2014-02-09 MED ORDER — HYDROMORPHONE HCL PF 1 MG/ML IJ SOLN
INTRAMUSCULAR | Status: AC
  Filled 2014-02-09: qty 1

## 2014-02-09 MED ORDER — LORAZEPAM 2 MG/ML IJ SOLN
1.0000 mg | Freq: Once | INTRAMUSCULAR | Status: AC
  Administered 2014-02-09: 1 mg via INTRAVENOUS
  Filled 2014-02-09: qty 1

## 2014-02-09 MED ORDER — LORAZEPAM 2 MG/ML IJ SOLN
INTRAMUSCULAR | Status: AC
  Filled 2014-02-09: qty 1

## 2014-02-09 MED ORDER — HYDROMORPHONE HCL PF 1 MG/ML IJ SOLN
1.0000 mg | Freq: Once | INTRAMUSCULAR | Status: AC
  Administered 2014-02-09: 1 mg via INTRAVENOUS

## 2014-02-09 MED ORDER — LORAZEPAM 2 MG/ML IJ SOLN
1.0000 mg | Freq: Once | INTRAMUSCULAR | Status: AC
  Administered 2014-02-09: 1 mg via INTRAVENOUS

## 2014-02-09 MED ORDER — PREDNISONE 10 MG PO TABS
20.0000 mg | ORAL_TABLET | Freq: Every day | ORAL | Status: DC
Start: 2014-02-09 — End: 2015-03-22

## 2014-02-09 MED ORDER — HYDROMORPHONE HCL PF 1 MG/ML IJ SOLN
1.0000 mg | Freq: Once | INTRAMUSCULAR | Status: AC
  Administered 2014-02-09: 1 mg via INTRAVENOUS
  Filled 2014-02-09: qty 1

## 2014-02-09 MED ORDER — METHYLPREDNISOLONE SODIUM SUCC 125 MG IJ SOLR
125.0000 mg | Freq: Once | INTRAMUSCULAR | Status: AC
  Administered 2014-02-09: 125 mg via INTRAVENOUS
  Filled 2014-02-09: qty 2

## 2014-02-09 MED ORDER — HYDROMORPHONE HCL PF 1 MG/ML IJ SOLN
INTRAMUSCULAR | Status: AC
  Administered 2014-02-09: 1 mg via INTRAVENOUS
  Filled 2014-02-09: qty 1

## 2014-02-09 MED ORDER — METHOCARBAMOL 500 MG PO TABS
500.0000 mg | ORAL_TABLET | Freq: Two times a day (BID) | ORAL | Status: DC
Start: 2014-02-09 — End: 2015-03-20

## 2014-02-09 MED ORDER — HYDROMORPHONE HCL 4 MG PO TABS: 4.0000 mg | ORAL_TABLET | ORAL | Status: DC | PRN

## 2014-02-09 MED ADMIN — Hydromorphone HCl Preservative Free (PF) Inj 1 MG/ML: INTRAVENOUS | @ 15:00:00

## 2014-02-09 NOTE — Discharge Instructions (Signed)
Follow up with DR. Roy in 1 week or follow up with your neurologist in [place].

## 2014-02-09 NOTE — ED Provider Notes (Signed)
CSN: [id number]     Arrival date & time 02/09/14  1040 History  This chart was scribed for Maudry Diego, MD by Starleen Arms, ED Scribe. This patient was seen in room [facility] and the patient's care was started at 11:30 AM.   Chief Complaint  Patient presents with  . Hip Pain   Patient is a 53 y.o. male presenting with hip pain. The history is provided by the patient. No language interpreter was used.  Hip Pain This is a chronic problem. The problem occurs constantly. The problem has not changed since onset.Pertinent negatives include no chest pain, no abdominal pain and no headaches. The symptoms are aggravated by walking. Nothing relieves the symptoms.    HPI Comments: AESON SAWYERS is a 53 y.o. male with a history of HTN, arthritis, and chronic back pain who presents to the Emergency Department complaining of 5 days of constant severe lower back and hip pain that radiates down his leg.  He describes the pan as cramping and states that the front of his right leg feels "numb".  The pain is aggravated by bearing weight and touch.  The patient was scheduled to have an epidural today at the [facility] but was refused due to the severity of his pain.  He was referred to the ED to have infection or further injury ruled out.  Patient had been seen in ED multiple times for similar complaints.  Patient has been seen by a neurosurgeon to address his back pain but received no specific diagnosis.  He has had 2 epidurals since this time.  The most recent epidural was 1 week ago.      Past Medical History  Diagnosis Date  . Hypertension   . Arthritis   . Gout    Past Surgical History  Procedure Laterality Date  . Inner ear surgery      recently-left  . Ulnar nerve surgeries      bilateral-3 on right and 1 on left  . Knee surgery      arthoscopy x3-left knee  . Back surgery      c6/c7 fusion  . Cervical disc surgery      c6-c7 fusion  . Cholecystectomy  05/01/2012    Procedure:  LAPAROSCOPIC CHOLECYSTECTOMY;  Surgeon: Jamesetta So, MD;  Location: [facility];  Service: General;  Laterality: N/A;   No family history on file. History  Substance Use Topics  . Smoking status: Former Smoker -- 0.50 packs/day for 8 years    Types: Cigarettes    Quit date: 04/28/1992  . Smokeless tobacco: Not on file  . Alcohol Use: No    Review of Systems  Constitutional: Negative for appetite change and fatigue.  HENT: Negative for congestion, ear discharge and sinus pressure.   Eyes: Negative for discharge.  Respiratory: Negative for cough.   Cardiovascular: Negative for chest pain.  Gastrointestinal: Negative for abdominal pain and diarrhea.  Genitourinary: Negative for frequency and hematuria.  Musculoskeletal: Positive for back pain and myalgias.  Skin: Negative for rash.  Neurological: Negative for seizures and headaches.  Psychiatric/Behavioral: Negative for hallucinations.    Allergies  Flonase and Morphine and related  Home Medications   Prior to Admission medications   Medication Sig Start Date End Date Taking? Authorizing Provider  fentaNYL (DURAGESIC - DOSED MCG/HR) 50 MCG/HR Place 50 mcg onto the skin every 3 (three) days.    Yes Historical Provider, MD  HYDROmorphone (DILAUDID) 2 MG tablet Take 2 mg by  mouth 2 (two) times daily. 01/29/14  Yes Historical Provider, MD  lisinopril (PRINIVIL,ZESTRIL) 40 MG tablet Take 40 mg by mouth daily.   Yes Historical Provider, MD  methocarbamol (ROBAXIN) 500 MG tablet Take 1 tablet (500 mg total) by mouth 2 (two) times daily as needed for muscle spasms. 02/04/14  Yes Johnna Acosta, MD  naproxen (NAPROSYN) 500 MG tablet Take 1 tablet (500 mg total) by mouth 2 (two) times daily with a meal. 02/04/14  Yes Johnna Acosta, MD  testosterone cypionate (DEPOTESTOTERONE CYPIONATE) 200 MG/ML injection Inject 200 mg into the muscle every 7 (seven) days. 01/31/14  Yes Historical Provider, MD   BP 154/90  Pulse 94  Temp(Src) 97.9 F (36.6 C)  (Oral)  Resp 16  SpO2 100% Physical Exam  Constitutional: He is oriented to person, place, and time. He appears well-developed.  Moderate to severe lumbar spine tenderness with positive SLR on right.    HENT:  Head: Normocephalic.  Eyes: Conjunctivae and EOM are normal. No scleral icterus.  Neck: Neck supple. No thyromegaly present.  Cardiovascular: Normal rate and regular rhythm.  Exam reveals no gallop and no friction rub.   No murmur heard. Pulmonary/Chest: No stridor. He has no wheezes. He has no rales. He exhibits no tenderness.  Abdominal: He exhibits no distension. There is no tenderness. There is no rebound.  Musculoskeletal: Normal range of motion. He exhibits no edema.  Lymphadenopathy:    He has no cervical adenopathy.  Neurological: He is oriented to person, place, and time. He exhibits normal muscle tone. Coordination normal.  Skin: No rash noted. No erythema.  Psychiatric: He has a normal mood and affect. His behavior is normal.    ED Course  Procedures (including critical care time)  DIAGNOSTIC STUDIES: Oxygen Saturation is 100% on RA, normal by my interpretation.    COORDINATION OF CARE:  11:40 PM Will order pain medication, imaging and labs.  Patient acknowledges and agrees with plan.    Labs Review Labs Reviewed - No data to display  Imaging Review No results found.   EKG Interpretation None      MDM   Final diagnoses:  None    Back pain with acute and chronic changes on mri.  I spoke with radiologist.  No findings for immediate neurosurgery referral.   Pt has seen dr. Carloyn Manner before and will attempt follow up in one week.  Pt given steroids,  Robaxin and increase pain meds   The chart was scribed for me under my direct supervision.  I personally performed the history, physical, and medical decision making and all procedures in the evaluation of this patient.Maudry Diego, MD 02/09/14 [phone]

## 2014-02-09 NOTE — ED Notes (Signed)
Increase of right side/hip pain with radiation down rt leg per pt along with numbness and weakness. Scheduled for epidural today and unable to get d/t pain per pt.

## 2014-02-09 NOTE — ED Notes (Signed)
Radiology transport here to pick up patient for MRI. Pt expressed concern that he will not be able to lay flat and tolerate the test due to pain and anxiety issues which he states was not relieved/lessened by the medications we just gave him. Patient requested that we pull his records from when he was here in May and had an MRI done, see what we gave him at that visit and give him that again because per patient it "knocked him out".   I did pull his records from that encounter and patient was given Dilaudid 1mg  and Ativan 1mg , the same medication and dosage he just received. I explained this to patient with his daughter, wife and two radiology techs at the bedside. Stated I will speak with Dr. Dewayne Hatch for further orders.

## 2014-02-09 NOTE — ED Notes (Signed)
Pt back from radiology, per Fritz Pickerel (radiology tech) unable to complete test due to patient moving, complaining about cramps. Patient back in room now sleeping comfrontably.

## 2014-02-09 NOTE — ED Notes (Signed)
Informed by Dr. Roderic Palau at 1400 of orders to re-attempt MRI. Verbal orders received to give patient Dilaudid 1mg  every 15 minutes as needs for a total of three doses.   Pt given following medications prior to leaving for MRI scan; Dilaudid 1mg  IV and Ativan 1 mg IV at 1418. Second dose of Dilaudid 1mg  IV given at 1433.  Pt left for MRI around 1440. At 1455 I received a call from radiology tech stating patient awake, moving and unable to tolerate MRI any longer. Bethena Roys, charge nurse to radiology to check on patient and give an additional dose of Dilaudid 1mg  IV, for the third and final dose of verbal order.

## 2014-02-09 NOTE — ED Notes (Signed)
Received call from MRI tech stating patient complaining of pain and that he can not go through MRI. Spoke with Dr. Dewayne Hatch concerning this; orders received.  In radiology to give patient ordered medication patients IV would not flush, IV d'ced and a new IV started. Ativan 1mg  and Dilaudid 1mg  given. Patient calm, talking and laying comfortably the entire time, no signs of pain/distress.

## 2014-02-21 ENCOUNTER — Ambulatory Visit: Payer: Self-pay | Admitting: Pain Medicine

## 2014-03-01 ENCOUNTER — Ambulatory Visit: Payer: Self-pay | Admitting: Pain Medicine

## 2014-03-18 ENCOUNTER — Ambulatory Visit: Payer: Self-pay | Admitting: Family Medicine

## 2014-03-29 ENCOUNTER — Ambulatory Visit: Payer: Self-pay | Admitting: Pain Medicine

## 2014-04-27 ENCOUNTER — Ambulatory Visit: Payer: Self-pay | Admitting: Pain Medicine

## 2014-05-31 ENCOUNTER — Ambulatory Visit: Payer: Self-pay | Admitting: Pain Medicine

## 2014-06-27 ENCOUNTER — Ambulatory Visit: Payer: Self-pay | Admitting: Pain Medicine

## 2014-07-26 ENCOUNTER — Ambulatory Visit: Payer: Self-pay | Admitting: Pain Medicine

## 2014-08-24 ENCOUNTER — Ambulatory Visit: Payer: Self-pay | Admitting: Pain Medicine

## 2014-08-25 ENCOUNTER — Ambulatory Visit: Payer: Self-pay | Admitting: Family Medicine

## 2014-09-06 ENCOUNTER — Ambulatory Visit: Payer: Medicare PPO | Admitting: Cardiovascular Disease

## 2014-09-21 ENCOUNTER — Ambulatory Visit: Payer: Medicare PPO | Admitting: Cardiovascular Disease

## 2014-09-22 ENCOUNTER — Ambulatory Visit: Payer: Self-pay | Admitting: Pain Medicine

## 2014-10-01 DIAGNOSIS — M79606 Pain in leg, unspecified: Secondary | ICD-10-CM | POA: Insufficient documentation

## 2014-10-01 DIAGNOSIS — G8929 Other chronic pain: Secondary | ICD-10-CM | POA: Insufficient documentation

## 2014-10-01 DIAGNOSIS — M545 Low back pain, unspecified: Secondary | ICD-10-CM | POA: Insufficient documentation

## 2014-10-01 DIAGNOSIS — I10 Essential (primary) hypertension: Secondary | ICD-10-CM | POA: Insufficient documentation

## 2014-10-01 DIAGNOSIS — M7989 Other specified soft tissue disorders: Secondary | ICD-10-CM | POA: Insufficient documentation

## 2014-10-01 DIAGNOSIS — F419 Anxiety disorder, unspecified: Secondary | ICD-10-CM | POA: Insufficient documentation

## 2014-10-01 DIAGNOSIS — E668 Other obesity: Secondary | ICD-10-CM | POA: Insufficient documentation

## 2014-10-01 DIAGNOSIS — I739 Peripheral vascular disease, unspecified: Secondary | ICD-10-CM | POA: Insufficient documentation

## 2014-10-01 DIAGNOSIS — R0982 Postnasal drip: Secondary | ICD-10-CM | POA: Insufficient documentation

## 2014-10-01 DIAGNOSIS — M25529 Pain in unspecified elbow: Secondary | ICD-10-CM | POA: Insufficient documentation

## 2014-10-01 DIAGNOSIS — M48061 Spinal stenosis, lumbar region without neurogenic claudication: Secondary | ICD-10-CM | POA: Insufficient documentation

## 2014-10-01 DIAGNOSIS — R7989 Other specified abnormal findings of blood chemistry: Secondary | ICD-10-CM | POA: Insufficient documentation

## 2014-10-01 DIAGNOSIS — E785 Hyperlipidemia, unspecified: Secondary | ICD-10-CM | POA: Insufficient documentation

## 2014-10-01 DIAGNOSIS — R5383 Other fatigue: Secondary | ICD-10-CM | POA: Insufficient documentation

## 2014-10-01 DIAGNOSIS — E790 Hyperuricemia without signs of inflammatory arthritis and tophaceous disease: Secondary | ICD-10-CM | POA: Insufficient documentation

## 2014-10-01 DIAGNOSIS — Z1331 Encounter for screening for depression: Secondary | ICD-10-CM | POA: Insufficient documentation

## 2014-10-01 DIAGNOSIS — R079 Chest pain, unspecified: Secondary | ICD-10-CM | POA: Insufficient documentation

## 2014-10-01 DIAGNOSIS — N529 Male erectile dysfunction, unspecified: Secondary | ICD-10-CM | POA: Insufficient documentation

## 2014-10-01 DIAGNOSIS — M5116 Intervertebral disc disorders with radiculopathy, lumbar region: Secondary | ICD-10-CM | POA: Insufficient documentation

## 2014-10-01 DIAGNOSIS — J029 Acute pharyngitis, unspecified: Secondary | ICD-10-CM | POA: Insufficient documentation

## 2014-10-01 DIAGNOSIS — E559 Vitamin D deficiency, unspecified: Secondary | ICD-10-CM | POA: Insufficient documentation

## 2014-10-01 DIAGNOSIS — F329 Major depressive disorder, single episode, unspecified: Secondary | ICD-10-CM | POA: Insufficient documentation

## 2014-10-01 DIAGNOSIS — E669 Obesity, unspecified: Secondary | ICD-10-CM | POA: Insufficient documentation

## 2014-10-01 DIAGNOSIS — F32A Depression, unspecified: Secondary | ICD-10-CM | POA: Insufficient documentation

## 2014-10-24 ENCOUNTER — Ambulatory Visit
Admit: 2014-10-24 | Disposition: A | Discharge status: Home / Self Care | Payer: Self-pay | Attending: Pain Medicine | Admitting: Pain Medicine

## 2014-11-17 ENCOUNTER — Telehealth: Payer: Self-pay | Admitting: Pain Medicine

## 2014-11-17 NOTE — Telephone Encounter (Signed)
[name]  Please schedule Mr. Brent Hamilton or medication refill and evaluation for 7 AM Monday, 11/21/2014

## 2014-11-17 NOTE — Telephone Encounter (Signed)
Brent Hamilton is sch to come in on next Thursday, called and wanted to see if he could move his apt to mon or tues due to having an apt he needs to be at in [place] on Thursday. Please advise for scheduling

## 2014-11-21 ENCOUNTER — Encounter: Payer: Self-pay | Admitting: Pain Medicine

## 2014-11-21 ENCOUNTER — Ambulatory Visit: Payer: Commercial Managed Care - HMO | Attending: Pain Medicine | Admitting: Pain Medicine

## 2014-11-21 VITALS — BP 147/98 | HR 67 | Temp 98.0°F | Resp 16 | Ht 73.0 in | Wt 252.0 lb

## 2014-11-21 DIAGNOSIS — Z981 Arthrodesis status: Secondary | ICD-10-CM | POA: Insufficient documentation

## 2014-11-21 DIAGNOSIS — M069 Rheumatoid arthritis, unspecified: Secondary | ICD-10-CM | POA: Diagnosis not present

## 2014-11-21 DIAGNOSIS — M533 Sacrococcygeal disorders, not elsewhere classified: Secondary | ICD-10-CM | POA: Diagnosis not present

## 2014-11-21 DIAGNOSIS — M51369 Other intervertebral disc degeneration, lumbar region without mention of lumbar back pain or lower extremity pain: Secondary | ICD-10-CM

## 2014-11-21 DIAGNOSIS — M5136 Other intervertebral disc degeneration, lumbar region: Secondary | ICD-10-CM | POA: Insufficient documentation

## 2014-11-21 DIAGNOSIS — M7700 Medial epicondylitis, unspecified elbow: Secondary | ICD-10-CM | POA: Insufficient documentation

## 2014-11-21 DIAGNOSIS — M5030 Other cervical disc degeneration, unspecified cervical region: Secondary | ICD-10-CM

## 2014-11-21 DIAGNOSIS — G5600 Carpal tunnel syndrome, unspecified upper limb: Secondary | ICD-10-CM | POA: Diagnosis not present

## 2014-11-21 DIAGNOSIS — M47816 Spondylosis without myelopathy or radiculopathy, lumbar region: Secondary | ICD-10-CM

## 2014-11-21 DIAGNOSIS — M545 Low back pain: Secondary | ICD-10-CM | POA: Diagnosis present

## 2014-11-21 DIAGNOSIS — M542 Cervicalgia: Secondary | ICD-10-CM | POA: Diagnosis present

## 2014-11-21 DIAGNOSIS — M47812 Spondylosis without myelopathy or radiculopathy, cervical region: Secondary | ICD-10-CM | POA: Insufficient documentation

## 2014-11-21 MED ORDER — HYDROMORPHONE HCL 4 MG PO TABS: ORAL_TABLET | ORAL | Status: DC

## 2014-11-21 MED ORDER — FENTANYL 50 MCG/HR TD PT72: MEDICATED_PATCH | TRANSDERMAL | Status: DC

## 2014-11-21 NOTE — Progress Notes (Signed)
8937    Patient discharged to home ambulatory. Scripts given as ordered. teachback 3 complete.- JCS

## 2014-11-21 NOTE — Progress Notes (Signed)
Patient is 54 year old gentleman [facility] evaluation and treatment of pain involving the lumbar and lower extremity regions as well as the cervical region. Patient is status post surgery of the cervical region with pain which interferes with activities of daily living to significant degree at the present time we will continue patient's present medications and will consider modification of medications pending follow-up evaluation patient is understanding and in agreement with suggested treatment plan  Physical examination  Tenderness of the splenius capitis and occipitalis musculature region. Palpation of the acromioclavicular and glenohumeral joint regions reproduced mild discomfort with grip strength appeared to be equal there was tenderness over the thoracic facet thoracic paraspinal musculature regions. Without crepitus of the region noted with palpation over the lumbar paraspinal muscles and lumbar facets reproducing moderate discomfort. Lateral bending and rotation associated with moderate discomfort. Straight leg raising tolerates approximately 30 without increased pain with dorsiflexion. There was tenderness over the PSIS PII S region. Palpation of the greater trochanteric region with mild discomfort. Abdomen nontender no costovertebral angle tenderness noted  Assessment  degenerative disc disease of the cervical spine Status post surgical cervical fusion  Degenerative disc disease lumbar spine  Rheumatoid arthritis  Sacroiliac joint dysfunction  Epicondylitis of elbows  Carpal tunnel syndrome    Plan  Continue present medications.  F/U PCP for evaliation of  BP and general medical  condition.  F/U surgical evaluation.  F/U neurological evaluation.  May consider radiofrequency rhizolysis or intraspinal procedures pending response to present treatment and F/U evaluation.  Patient to call [facility] should patient have  concerns prior to scheduled return appointment.

## 2014-11-21 NOTE — Patient Instructions (Addendum)
Continue present medications.  F/U PCP for evaliation of  BP and general medical  condition.  F/U surgical evaluation.  F/U neurological evaluation.  May consider radiofrequency rhizolysis or intraspinal procedures pending response to present treatment and F/U evaluation.  Patient to call [facility] should patient have concerns prior to scheduled return appointment. Pain Management Discharge Instructions  General Discharge Instructions :  If you need to reach your doctor call: Monday-Friday 8:00 am - 4:00 pm at [phone] or toll free [phone].  After clinic hours [phone] to have operator reach doctor.  Bring all of your medication bottles to all your appointments in the pain clinic.  To cancel or reschedule your appointment with Pain Management please remember to call 24 hours in advance to avoid a fee.  Refer to the educational materials which you have been given on: General Risks, I had my Procedure. Discharge Instructions, Post Sedation.  Post Procedure Instructions:  The drugs you were given will stay in your system until tomorrow, so for the next 24 hours you should not drive, make any legal decisions or drink any alcoholic beverages.  You may eat anything you prefer, but it is better to start with liquids then soups and crackers, and gradually work up to solid foods.  Please notify your doctor immediately if you have any unusual bleeding, trouble breathing or pain that is not related to your normal pain.  Depending on the type of procedure that was done, some parts of your body may feel week and/or numb.  This usually clears up by tonight or the next day.  Walk with the use of an assistive device or accompanied by an adult for the 24 hours.  You may use ice on the affected area for the first 24 hours.  Put ice in a Ziploc bag and cover with a towel and place against area 15 minutes on 15 minutes off.  You may switch to heat after 24 hours. 

## 2014-11-24 ENCOUNTER — Encounter: Payer: Commercial Managed Care - HMO | Admitting: Pain Medicine

## 2014-12-14 ENCOUNTER — Other Ambulatory Visit: Payer: Self-pay | Admitting: Family Medicine

## 2014-12-14 DIAGNOSIS — I10 Essential (primary) hypertension: Secondary | ICD-10-CM

## 2014-12-14 NOTE — Telephone Encounter (Signed)
Patient needs refill on Lisinopril to be sent to [facility] in [place].

## 2014-12-15 MED ORDER — LISINOPRIL 40 MG PO TABS: 40.0000 mg | ORAL_TABLET | Freq: Every day | ORAL | Status: DC

## 2014-12-22 ENCOUNTER — Other Ambulatory Visit: Payer: Self-pay

## 2014-12-22 ENCOUNTER — Ambulatory Visit: Payer: Commercial Managed Care - HMO | Attending: Pain Medicine | Admitting: Pain Medicine

## 2014-12-22 ENCOUNTER — Encounter: Payer: Self-pay | Admitting: Pain Medicine

## 2014-12-22 VITALS — BP 128/88 | HR 87 | Temp 98.3°F | Resp 18 | Ht 72.0 in | Wt 252.0 lb

## 2014-12-22 DIAGNOSIS — M47812 Spondylosis without myelopathy or radiculopathy, cervical region: Secondary | ICD-10-CM

## 2014-12-22 DIAGNOSIS — M5124 Other intervertebral disc displacement, thoracic region: Secondary | ICD-10-CM | POA: Insufficient documentation

## 2014-12-22 DIAGNOSIS — M069 Rheumatoid arthritis, unspecified: Secondary | ICD-10-CM | POA: Insufficient documentation

## 2014-12-22 DIAGNOSIS — M7700 Medial epicondylitis, unspecified elbow: Secondary | ICD-10-CM | POA: Insufficient documentation

## 2014-12-22 DIAGNOSIS — M179 Osteoarthritis of knee, unspecified: Secondary | ICD-10-CM | POA: Diagnosis not present

## 2014-12-22 DIAGNOSIS — M542 Cervicalgia: Secondary | ICD-10-CM | POA: Diagnosis present

## 2014-12-22 DIAGNOSIS — M47814 Spondylosis without myelopathy or radiculopathy, thoracic region: Secondary | ICD-10-CM | POA: Insufficient documentation

## 2014-12-22 DIAGNOSIS — M4806 Spinal stenosis, lumbar region: Secondary | ICD-10-CM | POA: Diagnosis not present

## 2014-12-22 DIAGNOSIS — M5030 Other cervical disc degeneration, unspecified cervical region: Secondary | ICD-10-CM | POA: Diagnosis not present

## 2014-12-22 DIAGNOSIS — M546 Pain in thoracic spine: Secondary | ICD-10-CM | POA: Diagnosis present

## 2014-12-22 DIAGNOSIS — M5134 Other intervertebral disc degeneration, thoracic region: Secondary | ICD-10-CM | POA: Insufficient documentation

## 2014-12-22 DIAGNOSIS — I10 Essential (primary) hypertension: Secondary | ICD-10-CM

## 2014-12-22 DIAGNOSIS — M5136 Other intervertebral disc degeneration, lumbar region: Secondary | ICD-10-CM | POA: Diagnosis not present

## 2014-12-22 DIAGNOSIS — M5126 Other intervertebral disc displacement, lumbar region: Secondary | ICD-10-CM | POA: Diagnosis not present

## 2014-12-22 DIAGNOSIS — Z981 Arthrodesis status: Secondary | ICD-10-CM

## 2014-12-22 DIAGNOSIS — M533 Sacrococcygeal disorders, not elsewhere classified: Secondary | ICD-10-CM

## 2014-12-22 DIAGNOSIS — M47816 Spondylosis without myelopathy or radiculopathy, lumbar region: Secondary | ICD-10-CM

## 2014-12-22 MED ORDER — HYDROMORPHONE HCL 4 MG PO TABS: ORAL_TABLET | ORAL | Status: DC

## 2014-12-22 MED ORDER — FENTANYL 50 MCG/HR TD PT72: MEDICATED_PATCH | TRANSDERMAL | Status: DC

## 2014-12-22 MED ORDER — LISINOPRIL 40 MG PO TABS: 40.0000 mg | ORAL_TABLET | Freq: Every day | ORAL | Status: DC

## 2014-12-22 NOTE — Progress Notes (Signed)
Safety precautions to be maintained throughout the outpatient stay will include: orient to surroundings, keep bed in low position, maintain call bell within reach at all times, provide assistance with transfer out of bed and ambulation.  

## 2014-12-22 NOTE — Telephone Encounter (Signed)
Pharmacy sent request for Rx. Thanks!

## 2014-12-22 NOTE — Progress Notes (Signed)
   Subjective:    Patient ID: Brent Hamilton, male    DOB: [birth year], 54 y.o.   MRN: [id number]  HPI  The patient is a 54 year old gentleman returns to [place] for further evaluation and treatment of pain involving the neck and entire back upper and lower extremity regions. Patient has some increased lower back lower extremity pain and is using cane as an assistive device for ambulating. Patient states that the back pain is quite severe back and is aggravated by twisting turning maneuvers without radiation of pain to the lower extremities we discussed patient's condition and will consider patient interventional treatment at time return appointment in attempt decrease severity of patient's symptoms and hopefully retard progression of patient's symptoms. The patient was understanding and in agreement with suggested treatment plan.    Review of Systems     Objective:   Physical Exam  There was mild tenderness of the splenius capitate and occipitalis musculature regions. Palpation over the cervical and thoracic facet and paraspinal musculature region was a tends to palpation of mild degree. There was tenderness of the acromioclavicular glenohumeral joint region of mild degree and patient appeared to be with slightly decreased grip strength. Tinel and Phalen's maneuver were without increase of severe discomfort. Palpation over the region of the thoracic facet thoracic paraspinal musculature region was a tends to palpation of mild to moderate degree. No crepitus of the thoracic region was noted. Palpation over the lumbar paraspinal musculature region lumbar facet region was increased pain with lateral bending and rotation and extension and palpation of the lumbar facets reproducing moderate to moderately severe discomfort there was severe increased pain with extension and palpation of the lumbar facet region. Straight leg raising was tolerates approximately 20 without an increase of pain  with dorsiflexion noted. Mild tenderness of the greater trochanteric region iliotibial band region.. The abdomen was nontender and no costovertebral angle tenderness was noted.      Assessment & Plan:  Degenerative disc disease lumbar spine  T12-L1, L1 L2 facet hypertrophy L2-3 facet hypertrophy L4-5 anterior listhesis at this level with mild disc bulging and ligamentous hypertrophy with moderate bilateral L4 foraminal stenosis, L5-S1 facet hypertrophy  Lumbar facet syndrome  Lumbar stenosis with neurogenic claudication  Degenerative disc disease thoracic spine T11-12 circumferential disc bulge T10-11 partially visible degenerative changes of the thoracic  Degenerative disc disease cervical spine  Sacroiliac joint dysfunction  Rheumatoid arthritis  DJD of knee  Epicondylitis of elbows    Plan   Continue present medications. Dilaidid and fentanyl patch  Lumbar facet, medial branch nerve, blocks or lumbar epidural steroid injection to be performed at time return appointment pending insurance approval  F/U PCP for evaliation of  BP and general medical  condition.  F/U surgical evaluation.  F/U neurological evaluation.  May consider radiofrequency rhizolysis or intraspinal procedures pending response to present treatment and F/U evaluation.  Patient to call [facility] should patient have concerns prior to scheduled return appointment.

## 2014-12-22 NOTE — Patient Instructions (Addendum)
Continue present medications.  Lumbar facet, medial branch nerve, blocks to be performed 01/04/2015  F/U PCP for evaliation of  BP and general medical  condition.  F/U surgical evaluation.  F/U neurological evaluation.  May consider radiofrequency rhizolysis or intraspinal procedures pending response to present treatment and F/U evaluation.  Patient to call [facility] should patient have concerns prior to scheduled return appointment. Facet Blocks Patient Information  Description: The facets are joints in the spine between the vertebrae.  Like any joints in the body, facets can become irritated and painful.  Arthritis can also effect the facets.  By injecting steroids and local anesthetic in and around these joints, we can temporarily block the nerve supply to them.  Steroids act directly on irritated nerves and tissues to reduce selling and inflammation which often leads to decreased pain.  Facet blocks may be done anywhere along the spine from the neck to the low back depending upon the location of your pain.   After numbing the skin with local anesthetic (like Novocaine), a small needle is passed onto the facet joints under x-ray guidance.  You may experience a sensation of pressure while this is being done.  The entire block usually lasts about 15-25 minutes.   Conditions which may be treated by facet blocks:   Low back/buttock pain  Neck/shoulder pain  Certain types of headaches  Preparation for the injection:  1. Do not eat any solid food or dairy products within 6 hours of your appointment. 2. You may drink clear liquid up to 2 hours before appointment.  Clear liquids include water, black coffee, juice or soda.  No milk or cream please. 3. You may take your regular medication, including pain medications, with a sip of water before your appointment.  Diabetics should hold regular insulin (if taken separately) and take 1/2 normal NPH dose the morning of the procedure.   Carry some sugar containing items with you to your appointment. 4. A driver must accompany you and be prepared to drive you home after your procedure. 5. Bring all your current medications with you. 6. An IV may be inserted and sedation may be given at the discretion of the physician. 7. A blood pressure cuff, EKG and other monitors will often be applied during the procedure.  Some patients may need to have extra oxygen administered for a short period. 8. You will be asked to provide medical information, including your allergies and medications, prior to the procedure.  We must know immediately if you are taking blood thinners (like Coumadin/Warfarin) or if you are allergic to IV iodine contrast (dye).  We must know if you could possible be pregnant.  Possible side-effects:   Bleeding from needle site  Infection (rare, may require surgery)  Nerve injury (rare)  Numbness & tingling (temporary)  Difficulty urinating (rare, temporary)  Spinal headache (a headache worse with upright posture)  Light-headedness (temporary)  Pain at injection site (serveral days)  Decreased blood pressure (rare, temporary)  Weakness in arm/leg (temporary)  Pressure sensation in back/neck (temporary)   Call if you experience:   Fever/chills associated with headache or increased back/neck pain  Headache worsened by an upright position  New onset, weakness or numbness of an extremity below the injection site  Hives or difficulty breathing (go to the emergency room)  Inflammation or drainage at the injection site(s)  Severe back/neck pain greater than usual  New symptoms which are concerning to you  Please note:  Although the local anesthetic  injected can often make your back or neck feel good for several hours after the injection, the pain will likely return. It takes 3-7 days for steroids to work.  You may not notice any pain relief for at least one week.  If effective, we will often do a  series of 2-3 injections spaced 3-6 weeks apart to maximally decrease your pain.  After the initial series, you may be a candidate for a more permanent nerve block of the facets.  If you have any questions, please call #[phone]) [facility]

## 2015-01-04 ENCOUNTER — Ambulatory Visit: Payer: Commercial Managed Care - HMO | Admitting: Pain Medicine

## 2015-01-05 ENCOUNTER — Other Ambulatory Visit: Payer: Self-pay | Admitting: Family Medicine

## 2015-01-05 NOTE — Telephone Encounter (Signed)
Scheduled appointment for 02-02-15 but would like to request refill on xanex. He is completely out. Please send to [facility]-

## 2015-01-06 NOTE — Telephone Encounter (Signed)
Patient should schedule an appointment for medication refills.

## 2015-01-09 ENCOUNTER — Ambulatory Visit: Payer: Commercial Managed Care - HMO | Admitting: Pain Medicine

## 2015-01-11 ENCOUNTER — Encounter: Payer: Self-pay | Admitting: Family Medicine

## 2015-01-11 ENCOUNTER — Ambulatory Visit (INDEPENDENT_AMBULATORY_CARE_PROVIDER_SITE_OTHER): Payer: Commercial Managed Care - HMO | Admitting: Family Medicine

## 2015-01-11 VITALS — BP 132/75 | HR 89 | Temp 98.7°F | Ht 72.0 in | Wt 278.3 lb

## 2015-01-11 DIAGNOSIS — E291 Testicular hypofunction: Secondary | ICD-10-CM

## 2015-01-11 DIAGNOSIS — E559 Vitamin D deficiency, unspecified: Secondary | ICD-10-CM

## 2015-01-11 DIAGNOSIS — F419 Anxiety disorder, unspecified: Secondary | ICD-10-CM | POA: Diagnosis not present

## 2015-01-11 DIAGNOSIS — R7989 Other specified abnormal findings of blood chemistry: Secondary | ICD-10-CM

## 2015-01-11 MED ORDER — ALPRAZOLAM 0.5 MG PO TABS: 0.5000 mg | ORAL_TABLET | Freq: Three times a day (TID) | ORAL | Status: DC | PRN

## 2015-01-11 NOTE — Progress Notes (Signed)
Name: Brent Hamilton   MRN: [id number]    DOB: 1960-08-16   Date:01/11/2015       Progress Note  Subjective  Chief Complaint  Chief Complaint  Patient presents with  . Medication Refill  . Anxiety    Anxiety Presents for follow-up visit. Patient reports no depressed mood, excessive worry, insomnia, muscle tension, palpitations, panic or shortness of breath.   Past treatments include benzodiazephines. The treatment provided significant relief. Compliance with prior treatments has been good.  Vitamin D deficiency Pt. with history of vitamin D deficiency. Last level obtained in February 2016 was 25.1 and has taken vitamin D 50,000 units once capsule every week for 12 weeks. Testosterone deficiency  Patient with history of low testosterone level. Currently on Testosterone Cypionate 200 mg IM every 2 weeks. Last testosterone and PSA  level obtained in February 2016 was within the normal range at 851 and 0.9 respectively.  Past Medical History  Diagnosis Date  . Hypertension   . Arthritis   . Gout     Past Surgical History  Procedure Laterality Date  . Inner ear surgery      recently-left  . Ulnar nerve surgeries      bilateral-3 on right and 1 on left  . Knee surgery      arthoscopy x3-left knee  . Back surgery      c6/c7 fusion  . Cervical disc surgery      c6-c7 fusion  . Cholecystectomy  05/01/2012    Procedure: LAPAROSCOPIC CHOLECYSTECTOMY;  Surgeon: Jamesetta So, MD;  Location: [facility];  Service: General;  Laterality: N/A;    Family History  Problem Relation Age of Onset  . Arthritis Mother   . Heart disease Mother   . Cancer Father     History   Social History  . Marital Status: Married    Spouse Name: N/A  . Number of Children: N/A  . Years of Education: N/A   Occupational History  . Not on file.   Social History Main Topics  . Smoking status: Former Smoker -- 0.50 packs/day for 8 years    Types: Cigarettes    Quit date: 04/28/1992  . Smokeless  tobacco: Not on file  . Alcohol Use: No  . Drug Use: No  . Sexual Activity: Yes    Birth Control/ Protection: None   Other Topics Concern  . Not on file   Social History Narrative     Current outpatient prescriptions:  .  ALPRAZolam (XANAX) 0.5 MG tablet, Take 1 tablet (0.5 mg total) by mouth 3 (three) times daily as needed for anxiety., Disp: 90 tablet, Rfl: 0 .  fentaNYL (DURAGESIC - DOSED MCG/HR) 50 MCG/HR, Place 50 mcg onto the skin every other day. , Disp: , Rfl:  .  fentaNYL (DURAGESIC - DOSED MCG/HR) 50 MCG/HR, Apply 1 patch to skin every 2 days if tolerated, Disp: 15 patch, Rfl: 0 .  HYDROmorphone (DILAUDID) 4 MG tablet, 1 tablet by mouth twice a day if tolerated for breakthrough pain while wearing fentanyl patch, Disp: 60 tablet, Rfl: 0 .  lisinopril (PRINIVIL,ZESTRIL) 40 MG tablet, Take 1 tablet (40 mg total) by mouth daily., Disp: 90 tablet, Rfl: 0 .  testosterone cypionate (DEPOTESTOTERONE CYPIONATE) 200 MG/ML injection, Inject 100 mg into the muscle every 7 (seven) days. , Disp: , Rfl:  .  testosterone cypionate (DEPOTESTOTERONE CYPIONATE) 200 MG/ML injection, Inject into the muscle., Disp: , Rfl:  .  methocarbamol (ROBAXIN) 500 MG tablet, Take 1  tablet (500 mg total) by mouth 2 (two) times daily as needed for muscle spasms. (Patient not taking: Reported on 01/11/2015), Disp: 20 tablet, Rfl: 0 .  methocarbamol (ROBAXIN) 500 MG tablet, Take 1 tablet (500 mg total) by mouth 2 (two) times daily. (Patient not taking: Reported on 01/11/2015), Disp: 20 tablet, Rfl: 0 .  naproxen (NAPROSYN) 500 MG tablet, Take 1 tablet (500 mg total) by mouth 2 (two) times daily with a meal. (Patient not taking: Reported on 01/11/2015), Disp: 30 tablet, Rfl: 0 .  Niacin, Antihyperlipidemic, 500 MG TABS, Take by mouth daily. , Disp: , Rfl:  .  predniSONE (DELTASONE) 10 MG tablet, Take 2 tablets (20 mg total) by mouth daily. (Patient not taking: Reported on 01/11/2015), Disp: 15 tablet, Rfl: 0 .  sildenafil  (VIAGRA) 50 MG tablet, Take by mouth., Disp: , Rfl:   Allergies  Allergen Reactions  . Flonase [Fluticasone] Swelling, Anaphylaxis and Rash  . Morphine     Other reaction(s): Weal  . Oxycodone Swelling  . Morphine And Related Itching and Rash    Rash developed after about 10 days of taking the tablets     Review of Systems  Constitutional: Positive for malaise/fatigue.  Respiratory: Negative for shortness of breath.   Cardiovascular: Negative for palpitations.  Psychiatric/Behavioral: Negative for depression. The patient does not have insomnia.       Objective  Filed Vitals:   01/11/15 1221  BP: 132/75  Pulse: 89  Temp: 98.7 F (37.1 C)  TempSrc: Oral  Height: 6' (1.829 m)  Weight: 278 lb 4.8 oz (126.236 kg)  SpO2: 97%    Physical Exam  Constitutional: He is oriented to person, place, and time and well-developed, well-nourished, and in no distress.  Cardiovascular: Normal rate and regular rhythm.   Pulmonary/Chest: Effort normal and breath sounds normal.  Abdominal: Soft. Bowel sounds are normal.  Neurological: He is alert and oriented to person, place, and time.  Skin: Skin is warm and dry.  Psychiatric: Mood, memory, affect and judgment normal.  Nursing note and vitals reviewed.    Assessment & Plan 1. Avitaminosis D Check vitamin D level today - Vitamin D (25 hydroxy)  2. Decreased testosterone level Recheck serum testosterone level today and follow-up - Testosterone  3. Anxiety Symptoms of anxiety are responsive to benzodiazepine therapy. Patient is compliant with medication instructions and taking Alprazolam only as needed and as directed. He is aware of the tolerance potential for benzodiazepines. Refills provided. - ALPRAZolam (XANAX) 0.5 MG tablet; Take 1 tablet (0.5 mg total) by mouth 3 (three) times daily as needed for anxiety.  Dispense: 90 tablet; Refill: 0   Lashaundra Lehrmann [facility] 01/11/2015 8:17 PM

## 2015-01-18 ENCOUNTER — Encounter: Payer: Self-pay | Admitting: Pain Medicine

## 2015-01-18 ENCOUNTER — Ambulatory Visit: Payer: Commercial Managed Care - HMO | Attending: Pain Medicine | Admitting: Pain Medicine

## 2015-01-18 VITALS — BP 110/68 | HR 75 | Temp 97.8°F | Resp 16 | Ht 72.0 in | Wt 272.0 lb

## 2015-01-18 DIAGNOSIS — M48062 Spinal stenosis, lumbar region with neurogenic claudication: Secondary | ICD-10-CM

## 2015-01-18 DIAGNOSIS — M5136 Other intervertebral disc degeneration, lumbar region: Secondary | ICD-10-CM

## 2015-01-18 DIAGNOSIS — M47812 Spondylosis without myelopathy or radiculopathy, cervical region: Secondary | ICD-10-CM

## 2015-01-18 DIAGNOSIS — M79604 Pain in right leg: Secondary | ICD-10-CM | POA: Diagnosis present

## 2015-01-18 DIAGNOSIS — M4806 Spinal stenosis, lumbar region: Secondary | ICD-10-CM | POA: Diagnosis not present

## 2015-01-18 DIAGNOSIS — M47816 Spondylosis without myelopathy or radiculopathy, lumbar region: Secondary | ICD-10-CM

## 2015-01-18 DIAGNOSIS — M533 Sacrococcygeal disorders, not elsewhere classified: Secondary | ICD-10-CM

## 2015-01-18 DIAGNOSIS — M5030 Other cervical disc degeneration, unspecified cervical region: Secondary | ICD-10-CM

## 2015-01-18 DIAGNOSIS — M5126 Other intervertebral disc displacement, lumbar region: Secondary | ICD-10-CM | POA: Diagnosis not present

## 2015-01-18 DIAGNOSIS — M79605 Pain in left leg: Secondary | ICD-10-CM | POA: Diagnosis present

## 2015-01-18 DIAGNOSIS — M545 Low back pain: Secondary | ICD-10-CM | POA: Diagnosis present

## 2015-01-18 DIAGNOSIS — Z981 Arthrodesis status: Secondary | ICD-10-CM

## 2015-01-18 MED ORDER — BUPIVACAINE HCL (PF) 0.25 % IJ SOLN
INTRAMUSCULAR | Status: AC
  Administered 2015-01-18: 30 mL
  Filled 2015-01-18: qty 30

## 2015-01-18 MED ORDER — LIDOCAINE HCL (PF) 1 % IJ SOLN
INTRAMUSCULAR | Status: AC
  Administered 2015-01-18: 5 mL
  Filled 2015-01-18: qty 5

## 2015-01-18 MED ORDER — MIDAZOLAM HCL 5 MG/5ML IJ SOLN
INTRAMUSCULAR | Status: AC
  Administered 2015-01-18: 5 mg via INTRAVENOUS
  Filled 2015-01-18: qty 5

## 2015-01-18 MED ORDER — TRIAMCINOLONE ACETONIDE 40 MG/ML IJ SUSP
INTRAMUSCULAR | Status: AC
  Administered 2015-01-18: 40 mg
  Filled 2015-01-18: qty 1

## 2015-01-18 MED ORDER — FENTANYL 50 MCG/HR TD PT72
MEDICATED_PATCH | TRANSDERMAL | Status: DC
Start: 2015-01-18 — End: 2015-02-16

## 2015-01-18 MED ORDER — ORPHENADRINE CITRATE 30 MG/ML IJ SOLN
INTRAMUSCULAR | Status: AC
  Administered 2015-01-18: 60 mg
  Filled 2015-01-18: qty 2

## 2015-01-18 MED ORDER — CEFAZOLIN SODIUM 1 G IJ SOLR
INTRAMUSCULAR | Status: AC
  Administered 2015-01-18: 1 g via INTRAVENOUS
  Filled 2015-01-18: qty 10

## 2015-01-18 MED ORDER — CEFUROXIME AXETIL 250 MG PO TABS: 250.0000 mg | ORAL_TABLET | Freq: Two times a day (BID) | ORAL | Status: DC

## 2015-01-18 MED ORDER — FENTANYL CITRATE (PF) 100 MCG/2ML IJ SOLN
INTRAMUSCULAR | Status: AC
  Administered 2015-01-18: 100 ug via INTRAVENOUS
  Filled 2015-01-18: qty 2

## 2015-01-18 MED ORDER — SODIUM CHLORIDE 0.9 % IJ SOLN
INTRAMUSCULAR | Status: AC
  Administered 2015-01-18: 20 mL
  Filled 2015-01-18: qty 20

## 2015-01-18 MED ORDER — HYDROMORPHONE HCL 4 MG PO TABS: ORAL_TABLET | ORAL | Status: DC

## 2015-01-18 NOTE — Progress Notes (Signed)
Script in hand for fentanyl and dilaudid.

## 2015-01-18 NOTE — Progress Notes (Signed)
Safety precautions to be maintained throughout the outpatient stay will include: orient to surroundings, keep bed in low position, maintain call bell within reach at all times, provide assistance with transfer out of bed and ambulation.  

## 2015-01-18 NOTE — Progress Notes (Signed)
   Subjective:    Patient ID: Brent Hamilton, male    DOB: [birth year], 54 y.o.   MRN: [id number]  HPI  PROCEDURE PERFORMED: Lumbar epidural steroid injection   NOTE: The patient is a 54 y.o. male who returns to [facility] for further evaluation and treatment of pain involving the lumbar and lower extremity region. MRI revealed the patient to be with L1-2 facet hypertrophy L2-3 facet hypertrophy L4-5 facet hypertrophy with anterior listhesis at this level with disc bulging with facet and ligamentous hypertrophy, moderate bilateral L4 foraminal stenosis, L5-S1 facet hypertrophy. The risks, benefits, and expectations of the procedure have been discussed and explained to the patient who was understanding and in agreement with suggested treatment plan. We will proceed with lumbar epidural steroid injection as discussed and as explained to the patient who is willing to proceed with procedure as planned.   DESCRIPTION OF PROCEDURE: Lumbar epidural steroid injection with IV Versed, IV fentanyl conscious sedation, EKG, blood pressure, pulse, and pulse oximetry monitoring. The procedure was performed with the patient in the prone position under fluoroscopic guidance. A local anesthetic skin wheal of 1.5% plain lidocaine was accomplished at proposed entry site. An 18-gauge Tuohy epidural needle was inserted at the L 4 vertebral body level left of the midline via loss-of-resistance technique with negative heme and negative CSF return. A total of 4 mL of Preservative-Free normal saline with 40 mg of Kenalog injected incrementally via epidurally placed needle. Needle was removed.    A total of 40 mg of Kenalog was utilized for the procedure.   The patient tolerated the injection well.    PLAN:   1. Medications: We will continue presently prescribed medications Dilaudid and fentanyl patch. 2. Will consider modification of treatment regimen pending response to treatment rendered on today's visit and  follow-up evaluation. 3. The patient is to follow-up with primary care physician Dr.Shah regarding blood pressure and general medical condition status post lumbar epidural steroid injection performed on today's visit. 4. Surgical evaluation. 5. Neurological evaluation and rheumatological evaluations as discussed. 6. The patient may be a candidate for radiofrequency procedures, implantation device, and other treatment pending response to treatment and follow-up evaluation. 7. The patient has been advised to adhere to proper body mechanics and avoid activities which appear to aggravate condition. 8. The patient has been advised to call the [facility] prior to scheduled return appointment should there be significant change in condition or should there be sign  The patient is understanding and agrees with the suggested  treatment plan     Review of Systems     Objective:   Physical Exam        Assessment & Plan:

## 2015-01-18 NOTE — Patient Instructions (Addendum)
Continue present medications Dilaudid and fentanyl patch. Please obtain your antibiotic Ceftin today and begin taking antibiotic today  F/U PCP Dr.Shah   for evaliation of  BP and general medical  condition.  F/U surgical evaluation as discussed  F/U rheumatological evaluation  F/U neurological evaluation.  May consider radiofrequency rhizolysis or intraspinal procedures pending response to present treatment and F/U evaluation.  Patient to call [facility] Center should patient have concerns prior to scheduled return appointment.  Pain Management Discharge Instructions  General Discharge Instructions :  If you need to reach your doctor call: Monday-Friday 8:00 am - 4:00 pm at [phone] or toll free [phone].  After clinic hours [phone] to have operator reach doctor.  Bring all of your medication bottles to all your appointments in the pain clinic.  To cancel or reschedule your appointment with Pain Management please remember to call 24 hours in advance to avoid a fee.  Refer to the educational materials which you have been given on: General Risks, I had my Procedure. Discharge Instructions, Post Sedation.  Post Procedure Instructions:  The drugs you were given will stay in your system until tomorrow, so for the next 24 hours you should not drive, make any legal decisions or drink any alcoholic beverages.  You may eat anything you prefer, but it is better to start with liquids then soups and crackers, and gradually work up to solid foods.  Please notify your doctor immediately if you have any unusual bleeding, trouble breathing or pain that is not related to your normal pain.  Depending on the type of procedure that was done, some parts of your body may feel week and/or numb.  This usually clears up by tonight or the next day.  Walk with the use of an assistive device or accompanied by an adult for the 24 hours.  You may use ice on the affected area for the first  24 hours.  Put ice in a Ziploc bag and cover with a towel and place against area 15 minutes on 15 minutes off.  You may switch to heat after 24 hours.Epidural Steroid Injection Patient Information  Description: The epidural space surrounds the nerves as they exit the spinal cord.  In some patients, the nerves can be compressed and inflamed by a bulging disc or a tight spinal canal (spinal stenosis).  By injecting steroids into the epidural space, we can bring irritated nerves into direct contact with a potentially helpful medication.  These steroids act directly on the irritated nerves and can reduce swelling and inflammation which often leads to decreased pain.  Epidural steroids may be injected anywhere along the spine and from the neck to the low back depending upon the location of your pain.   After numbing the skin with local anesthetic (like Novocaine), a small needle is passed into the epidural space slowly.  You may experience a sensation of pressure while this is being done.  The entire block usually last less than 10 minutes.  Conditions which may be treated by epidural steroids:   Low back and leg pain  Neck and arm pain  Spinal stenosis  Post-laminectomy syndrome  Herpes zoster (shingles) pain  Pain from compression fractures  Preparation for the injection:  1. Do not eat any solid food or dairy products within 6 hours of your appointment.  2. You may drink clear liquids up to 2 hours before appointment.  Clear liquids include water, black coffee, juice or soda.  No milk or cream please.  3. You may take your regular medication, including pain medications, with a sip of water before your appointment  Diabetics should hold regular insulin (if taken separately) and take 1/2 normal NPH dos the morning of the procedure.  Carry some sugar containing items with you to your appointment. 4. A driver must accompany you and be prepared to drive you home after your procedure.  5. Bring all  your current medications with your. 6. An IV may be inserted and sedation may be given at the discretion of the physician.   7. A blood pressure cuff, EKG and other monitors will often be applied during the procedure.  Some patients may need to have extra oxygen administered for a short period. 8. You will be asked to provide medical information, including your allergies, prior to the procedure.  We must know immediately if you are taking blood thinners (like Coumadin/Warfarin)  Or if you are allergic to IV iodine contrast (dye). We must know if you could possible be pregnant.  Possible side-effects:  Bleeding from needle site  Infection (rare, may require surgery)  Nerve injury (rare)  Numbness & tingling (temporary)  Difficulty urinating (rare, temporary)  Spinal headache ( a headache worse with upright posture)  Light -headedness (temporary)  Pain at injection site (several days)  Decreased blood pressure (temporary)  Weakness in arm/leg (temporary)  Pressure sensation in back/neck (temporary)  Call if you experience:  Fever/chills associated with headache or increased back/neck pain.  Headache worsened by an upright position.  New onset weakness or numbness of an extremity below the injection site  Hives or difficulty breathing (go to the emergency room)  Inflammation or drainage at the infection site  Severe back/neck pain  Any new symptoms which are concerning to you  Please note:  Although the local anesthetic injected can often make your back or neck feel good for several hours after the injection, the pain will likely return.  It takes 3-7 days for steroids to work in the epidural space.  You may not notice any pain relief for at least that one week.  If effective, we will often do a series of three injections spaced 3-6 weeks apart to maximally decrease your pain.  After the initial series, we generally will wait several months before considering a repeat  injection of the same type.  If you have any questions, please call [phone] [facility]

## 2015-01-19 ENCOUNTER — Telehealth: Payer: Self-pay | Admitting: *Deleted

## 2015-01-19 NOTE — Telephone Encounter (Signed)
No problems 

## 2015-02-02 ENCOUNTER — Ambulatory Visit: Payer: Self-pay | Admitting: Family Medicine

## 2015-02-10 ENCOUNTER — Ambulatory Visit: Payer: Commercial Managed Care - HMO | Admitting: Family Medicine

## 2015-02-16 ENCOUNTER — Encounter: Payer: Self-pay | Admitting: Pain Medicine

## 2015-02-16 ENCOUNTER — Ambulatory Visit: Payer: Commercial Managed Care - HMO | Attending: Pain Medicine | Admitting: Pain Medicine

## 2015-02-16 VITALS — BP 141/88 | HR 89 | Temp 98.6°F | Resp 16 | Ht 73.0 in | Wt 262.0 lb

## 2015-02-16 DIAGNOSIS — M5134 Other intervertebral disc degeneration, thoracic region: Secondary | ICD-10-CM | POA: Diagnosis not present

## 2015-02-16 DIAGNOSIS — M79604 Pain in right leg: Secondary | ICD-10-CM | POA: Diagnosis present

## 2015-02-16 DIAGNOSIS — M4806 Spinal stenosis, lumbar region: Secondary | ICD-10-CM | POA: Insufficient documentation

## 2015-02-16 DIAGNOSIS — M5136 Other intervertebral disc degeneration, lumbar region: Secondary | ICD-10-CM | POA: Insufficient documentation

## 2015-02-16 DIAGNOSIS — M5124 Other intervertebral disc displacement, thoracic region: Secondary | ICD-10-CM | POA: Insufficient documentation

## 2015-02-16 DIAGNOSIS — M5030 Other cervical disc degeneration, unspecified cervical region: Secondary | ICD-10-CM

## 2015-02-16 DIAGNOSIS — M79605 Pain in left leg: Secondary | ICD-10-CM | POA: Diagnosis present

## 2015-02-16 DIAGNOSIS — M533 Sacrococcygeal disorders, not elsewhere classified: Secondary | ICD-10-CM | POA: Diagnosis not present

## 2015-02-16 DIAGNOSIS — M5126 Other intervertebral disc displacement, lumbar region: Secondary | ICD-10-CM | POA: Diagnosis not present

## 2015-02-16 DIAGNOSIS — M545 Low back pain: Secondary | ICD-10-CM | POA: Diagnosis present

## 2015-02-16 DIAGNOSIS — M47812 Spondylosis without myelopathy or radiculopathy, cervical region: Secondary | ICD-10-CM

## 2015-02-16 DIAGNOSIS — M4316 Spondylolisthesis, lumbar region: Secondary | ICD-10-CM | POA: Insufficient documentation

## 2015-02-16 DIAGNOSIS — Z981 Arthrodesis status: Secondary | ICD-10-CM

## 2015-02-16 DIAGNOSIS — M47816 Spondylosis without myelopathy or radiculopathy, lumbar region: Secondary | ICD-10-CM

## 2015-02-16 MED ORDER — HYDROMORPHONE HCL 4 MG PO TABS: ORAL_TABLET | ORAL | Status: DC

## 2015-02-16 MED ORDER — FENTANYL 50 MCG/HR TD PT72: MEDICATED_PATCH | TRANSDERMAL | Status: DC

## 2015-02-16 NOTE — Progress Notes (Signed)
   Subjective:    Patient ID: Brent Hamilton, male    DOB: 1960/08/27, 54 y.o.   MRN: [id number]  HPI  Patient is 54 year old gentleman returns [facility] evaluation and treatment of pain involving the region of the lower back and lower extremity region with mild discomfort of the neck and upper extremity region as well as the upper extremity region on the right involving the elbow. Patient states that he has had significant improvement with prior interventional treatment performed in [facility]. Patient wishes for [place] to attempt to have him approval for lumbar epidural steroid injection so that he can have procedure when necessary. We will continue medications at this time as prescribed and avoid interventional treatment. We will remain available to consider patient for additional interventional treatment as well as additional modification of treatment regimen pending response to the present treatment and follow-up evaluation. The patient was understanding and agreement with suggested treatment plan.   Review of Systems     Objective:   Physical Exam  There was mild tinnitus of the splenius capitis and occipitalis region. Mild tinnitus of the cervical facet cervical paraspinal muscles region as well as the thoracic facet thoracic paraspinal musculature region. Palpation of the acromioclavicular and glenohumeral joint regions were with mild tends to palpation and patient was with bilaterally equal grip strength. Tinel and Phalen's maneuver without increased pain of significant degree. There was mild tends to palpation of the medial and lateral epicondyles of the upper extremity. Patient was a tends to palpation of the thoracic facet thoracic paraspinal muscles region of mild degree. Palpation over the lumbar paraspinal muscles lumbar facet region was with mild to moderate tends to palpation. Lateral bending and rotation and extension and palpation of the lumbar  facets reproduce moderate discomfort. Straight leg raising was tolerates approximately 30 without a definite increased pain with dorsiflexion noted. There was negative clonus negative Homans. Extension and palpation of the lumbar facets reproduce moderate discomfort to moderately severe discomfort on the right compared to the left. DTRs appear to be trace at the knees abdomen was nontender and no costovertebral maintenance noted. There was negative clonus negative Homans    Assessment & Plan:    Degenerative disc disease lumbar spine  T12-L1, L1 L2 facet hypertrophy L2-3 facet hypertrophy L4-5 anterior listhesis at this level with mild disc bulging and ligamentous hypertrophy with moderate bilateral L4 foraminal stenosis, L5-S1 facet hypertrophy  Lumbar facet syndrome  Lumbar stenosis with neurogenic claudication  Degenerative disc disease thoracic spine T11-12 circumferential disc bulge T10-11 partially visible degenerative changes of the thoracic  Degenerative disc disease cervical spine  Sacroiliac joint dysfunction   Plan  Continue present medications Dilaudid and fentanyl patch  Lumbar epidural steroid injection to be performed at time return appointment if necessary  F/U PCP Dr.Shah  for evaliation of  BP and general medical  condition  F/U surgical evaluation  F/U neurological evaluation  May consider radiofrequency rhizolysis or intraspinal procedures pending response to present treatment and F/U evaluation   Patient to call [facility] Management Center should patient have concerns prior to scheduled return appointmen.

## 2015-02-16 NOTE — Progress Notes (Signed)
Safety precautions to be maintained throughout the outpatient stay will include: orient to surroundings, keep bed in low position, maintain call bell within reach at all times, provide assistance with transfer out of bed and ambulation.  

## 2015-02-16 NOTE — Patient Instructions (Signed)
Continue present medications Dilaudid and  fentanyl patch  F/U PCP Dr Manuella Ghazi  for evaliation of  BP and general medical  condition  F/U surgical evaluation  F/U neurological evaluation  May consider radiofrequency rhizolysis or intraspinal procedures pending response to present treatment and F/U evaluation   Patient to call [facility] Center should patient have concerns prior to scheduled return appointmen.

## 2015-03-02 ENCOUNTER — Other Ambulatory Visit: Payer: Self-pay | Admitting: Pain Medicine

## 2015-03-07 ENCOUNTER — Telehealth: Payer: Self-pay | Admitting: Family Medicine

## 2015-03-07 NOTE — Telephone Encounter (Signed)
Pt states he misplaced his lab order and needs a new one for testosterone and Vit D. Pt would like to pick this up first thing in the morning.

## 2015-03-08 ENCOUNTER — Encounter: Payer: Self-pay | Admitting: Family Medicine

## 2015-03-09 ENCOUNTER — Ambulatory Visit: Payer: Commercial Managed Care - HMO | Admitting: Family Medicine

## 2015-03-15 ENCOUNTER — Ambulatory Visit: Payer: Commercial Managed Care - HMO | Admitting: Family Medicine

## 2015-03-16 ENCOUNTER — Encounter: Payer: Self-pay | Admitting: Pain Medicine

## 2015-03-16 ENCOUNTER — Ambulatory Visit: Payer: Commercial Managed Care - HMO | Attending: Pain Medicine | Admitting: Pain Medicine

## 2015-03-16 VITALS — BP 143/89 | HR 85 | Temp 98.2°F | Resp 16 | Ht 73.0 in | Wt 258.0 lb

## 2015-03-16 DIAGNOSIS — M533 Sacrococcygeal disorders, not elsewhere classified: Secondary | ICD-10-CM | POA: Diagnosis not present

## 2015-03-16 DIAGNOSIS — M47814 Spondylosis without myelopathy or radiculopathy, thoracic region: Secondary | ICD-10-CM | POA: Insufficient documentation

## 2015-03-16 DIAGNOSIS — M5136 Other intervertebral disc degeneration, lumbar region: Secondary | ICD-10-CM | POA: Diagnosis not present

## 2015-03-16 DIAGNOSIS — M5030 Other cervical disc degeneration, unspecified cervical region: Secondary | ICD-10-CM

## 2015-03-16 DIAGNOSIS — M4806 Spinal stenosis, lumbar region: Secondary | ICD-10-CM | POA: Insufficient documentation

## 2015-03-16 DIAGNOSIS — Z981 Arthrodesis status: Secondary | ICD-10-CM

## 2015-03-16 DIAGNOSIS — M19021 Primary osteoarthritis, right elbow: Secondary | ICD-10-CM | POA: Diagnosis not present

## 2015-03-16 DIAGNOSIS — M5134 Other intervertebral disc degeneration, thoracic region: Secondary | ICD-10-CM | POA: Diagnosis not present

## 2015-03-16 DIAGNOSIS — M5126 Other intervertebral disc displacement, lumbar region: Secondary | ICD-10-CM | POA: Diagnosis not present

## 2015-03-16 DIAGNOSIS — M19022 Primary osteoarthritis, left elbow: Secondary | ICD-10-CM | POA: Diagnosis not present

## 2015-03-16 DIAGNOSIS — M47816 Spondylosis without myelopathy or radiculopathy, lumbar region: Secondary | ICD-10-CM

## 2015-03-16 DIAGNOSIS — M47812 Spondylosis without myelopathy or radiculopathy, cervical region: Secondary | ICD-10-CM

## 2015-03-16 DIAGNOSIS — M542 Cervicalgia: Secondary | ICD-10-CM | POA: Diagnosis present

## 2015-03-16 DIAGNOSIS — M48062 Spinal stenosis, lumbar region with neurogenic claudication: Secondary | ICD-10-CM

## 2015-03-16 MED ORDER — FENTANYL 50 MCG/HR TD PT72: MEDICATED_PATCH | TRANSDERMAL | Status: DC

## 2015-03-16 MED ORDER — HYDROMORPHONE HCL 4 MG PO TABS: ORAL_TABLET | ORAL | Status: DC

## 2015-03-16 NOTE — Progress Notes (Signed)
Safety precautions to be maintained throughout the outpatient stay will include: orient to surroundings, keep bed in low position, maintain call bell within reach at all times, provide assistance with transfer out of bed and ambulation.  

## 2015-03-16 NOTE — Patient Instructions (Addendum)
PLAN   Continue present medication tolerated and fentanyl patch  Lumbar epidural steroid injection at time of return appointment  F/U PCP Dr.Shah   for evaliation of  BP and general medical  condition  F/U surgical evaluation. May consider pending follow-up evaluations  F/U neurological evaluation. May consider pending follow-up evaluations  F/U rheumatological evaluation as needed  May consider radiofrequency rhizolysis or intraspinal procedures pending response to present treatment and F/U evaluation   Patient to call Pain Management Center should patient have concerns prior to scheduled return appointment. Pain Management Discharge Instructions  General Discharge Instructions :  If you need to reach your doctor call: Monday-Friday 8:00 am - 4:00 pm at [phone] or toll free [phone].  After clinic hours [phone] to have operator reach doctor.  Bring all of your medication bottles to all your appointments in the pain clinic.  To cancel or reschedule your appointment with Pain Management please remember to call 24 hours in advance to avoid a fee.  Refer to the educational materials which you have been given on: General Risks, I had my Procedure. Discharge Instructions, Post Sedation.  Post Procedure Instructions:  The drugs you were given will stay in your system until tomorrow, so for the next 24 hours you should not drive, make any legal decisions or drink any alcoholic beverages.  You may eat anything you prefer, but it is better to start with liquids then soups and crackers, and gradually work up to solid foods.  Please notify your doctor immediately if you have any unusual bleeding, trouble breathing or pain that is not related to your normal pain.  Depending on the type of procedure that was done, some parts of your body may feel week and/or numb.  This usually clears up by tonight or the next day.  Walk with the use of an assistive device or accompanied by  an adult for the 24 hours.  You may use ice on the affected area for the first 24 hours.  Put ice in a Ziploc bag and cover with a towel and place against area 15 minutes on 15 minutes off.  You may switch to heat after 24 hours.Epidural Steroid Injection Patient Information  Description: The epidural space surrounds the nerves as they exit the spinal cord.  In some patients, the nerves can be compressed and inflamed by a bulging disc or a tight spinal canal (spinal stenosis).  By injecting steroids into the epidural space, we can bring irritated nerves into direct contact with a potentially helpful medication.  These steroids act directly on the irritated nerves and can reduce swelling and inflammation which often leads to decreased pain.  Epidural steroids may be injected anywhere along the spine and from the neck to the low back depending upon the location of your pain.   After numbing the skin with local anesthetic (like Novocaine), a small needle is passed into the epidural space slowly.  You may experience a sensation of pressure while this is being done.  The entire block usually last less than 10 minutes.  Conditions which may be treated by epidural steroids:   Low back and leg pain  Neck and arm pain  Spinal stenosis  Post-laminectomy syndrome  Herpes zoster (shingles) pain  Pain from compression fractures  Preparation for the injection:  1. Do not eat any solid food or dairy products within 6 hours of your appointment.  2. You may drink clear liquids up to 2 hours before appointment.  Clear liquids include  water, black coffee, juice or soda.  No milk or cream please. 3. You may take your regular medication, including pain medications, with a sip of water before your appointment  Diabetics should hold regular insulin (if taken separately) and take 1/2 normal NPH dos the morning of the procedure.  Carry some sugar containing items with you to your appointment. 4. A driver must  accompany you and be prepared to drive you home after your procedure.  5. Bring all your current medications with your. 6. An IV may be inserted and sedation may be given at the discretion of the physician.   7. A blood pressure cuff, EKG and other monitors will often be applied during the procedure.  Some patients may need to have extra oxygen administered for a short period. 8. You will be asked to provide medical information, including your allergies, prior to the procedure.  We must know immediately if you are taking blood thinners (like Coumadin/Warfarin)  Or if you are allergic to IV iodine contrast (dye). We must know if you could possible be pregnant.  Possible side-effects:  Bleeding from needle site  Infection (rare, may require surgery)  Nerve injury (rare)  Numbness & tingling (temporary)  Difficulty urinating (rare, temporary)  Spinal headache ( a headache worse with upright posture)  Light -headedness (temporary)  Pain at injection site (several days)  Decreased blood pressure (temporary)  Weakness in arm/leg (temporary)  Pressure sensation in back/neck (temporary)  Call if you experience:  Fever/chills associated with headache or increased back/neck pain.  Headache worsened by an upright position.  New onset weakness or numbness of an extremity below the injection site  Hives or difficulty breathing (go to the emergency room)  Inflammation or drainage at the infection site  Severe back/neck pain  Any new symptoms which are concerning to you  Please note:  Although the local anesthetic injected can often make your back or neck feel good for several hours after the injection, the pain will likely return.  It takes 3-7 days for steroids to work in the epidural space.  You may not notice any pain relief for at least that one week.  If effective, we will often do a series of three injections spaced 3-6 weeks apart to maximally decrease your pain.  After the  initial series, we generally will wait several months before considering a repeat injection of the same type.  If you have any questions, please call [phone] [place]  What are the risk, side effects and possible complications? Generally speaking, most procedures are safe.  However, with any procedure there are risks, side effects, and the possibility of complications.  The risks and complications are dependent upon the sites that are lesioned, or the type of nerve block to be performed.  The closer the procedure is to the spine, the more serious the risks are.  Great care is taken when placing the radio frequency needles, block needles or lesioning probes, but sometimes complications can occur. 1. Infection: Any time there is an injection through the skin, there is a risk of infection.  This is why sterile conditions are used for these blocks.  There are four possible types of infection. 1. Localized skin infection. 2. Central Nervous System Infection-This can be in the form of Meningitis, which can be deadly. 3. Epidural Infections-This can be in the form of an epidural abscess, which can cause pressure inside of the spine, causing compression of  the spinal cord with subsequent paralysis. This would require an emergency surgery to decompress, and there are no guarantees that the patient would recover from the paralysis. 4. Discitis-This is an infection of the intervertebral discs.  It occurs in about 1% of discography procedures.  It is difficult to treat and it may lead to surgery.        2. Pain: the needles have to go through skin and soft tissues, will cause soreness.       3. Damage to internal structures:  The nerves to be lesioned may be near blood vessels or    other nerves which can be potentially damaged.       4. Bleeding: Bleeding is more common if the patient is taking blood thinners such as  aspirin, Coumadin,  Ticiid, Plavix, etc., or if he/she have some genetic predisposition  such as hemophilia. Bleeding into the spinal canal can cause compression of the spinal  cord with subsequent paralysis.  This would require an emergency surgery to  decompress and there are no guarantees that the patient would recover from the  paralysis.       5. Pneumothorax:  Puncturing of a lung is a possibility, every time a needle is introduced in  the area of the chest or upper back.  Pneumothorax refers to free air around the  collapsed lung(s), inside of the thoracic cavity (chest cavity).  Another two possible  complications related to a similar event would include: Hemothorax and Chylothorax.   These are variations of the Pneumothorax, where instead of air around the collapsed  lung(s), you may have blood or chyle, respectively.       6. Spinal headaches: They may occur with any procedures in the area of the spine.       7. Persistent CSF (Cerebro-Spinal Fluid) leakage: This is a rare problem, but may occur  with prolonged intrathecal or epidural catheters either due to the formation of a fistulous  track or a dural tear.       8. Nerve damage: By working so close to the spinal cord, there is always a possibility of  nerve damage, which could be as serious as a permanent spinal cord injury with  paralysis.       9. Death:  Although rare, severe deadly allergic reactions known as "Anaphylactic  reaction" can occur to any of the medications used.      10. Worsening of the symptoms:  We can always make thing worse.  What are the chances of something like this happening? Chances of any of this occuring are extremely low.  By statistics, you have more of a chance of getting killed in a motor vehicle accident: while driving to the hospital than any of the above occurring .  Nevertheless, you should be aware that they are possibilities.  In general, it is similar to taking a shower.  Everybody knows that you can slip, hit your head and  get killed.  Does that mean that you should not shower again?  Nevertheless always keep in mind that statistics do not mean anything if you happen to be on the wrong side of them.  Even if a procedure has a 1 (one) in a 1,000,000 (million) chance of going wrong, it you happen to be that one..Also, keep in mind that by statistics, you have more of a chance of having something go wrong when taking medications.  Who should not have this procedure? If you are on a blood  thinning medication (e.g. Coumadin, Plavix, see list of "Blood Thinners"), or if you have an active infection going on, you should not have the procedure.  If you are taking any blood thinners, please inform your physician.  How should I prepare for this procedure?  Do not eat or drink anything at least six hours prior to the procedure.  Bring a driver with you .  It cannot be a taxi.  Come accompanied by an adult that can drive you back, and that is strong enough to help you if your legs get weak or numb from the local anesthetic.  Take all of your medicines the morning of the procedure with just enough water to swallow them.  If you have diabetes, make sure that you are scheduled to have your procedure done first thing in the morning, whenever possible.  If you have diabetes, take only half of your insulin dose and notify our nurse that you have done so as soon as you arrive at the clinic.  If you are diabetic, but only take blood sugar pills (oral hypoglycemic), then do not take them on the morning of your procedure.  You may take them after you have had the procedure.  Do not take aspirin or any aspirin-containing medications, at least eleven (11) days prior to the procedure.  They may prolong bleeding.  Wear loose fitting clothing that may be easy to take off and that you would not mind if it got stained with Betadine or blood.  Do not wear any jewelry or perfume  Remove any nail coloring.  It will interfere with some of  our monitoring equipment.  NOTE: Remember that this is not meant to be interpreted as a complete list of all possible complications.  Unforeseen problems may occur.  BLOOD THINNERS The following drugs contain aspirin or other products, which can cause increased bleeding during surgery and should not be taken for 2 weeks prior to and 1 week after surgery.  If you should need take something for relief of minor pain, you may take acetaminophen which is found in Tylenol,m Datril, Anacin-3 and Panadol. It is not blood thinner. The products listed below are.  Do not take any of the products listed below in addition to any listed on your instruction sheet.  A.P.C or A.P.C with Codeine Codeine Phosphate Capsules #3 Ibuprofen Ridaura  ABC compound Congesprin Imuran rimadil  Advil Cope Indocin Robaxisal  Alka-Seltzer Effervescent Pain Reliever and Antacid Coricidin or Coricidin-D  Indomethacin Rufen  Alka-Seltzer plus Cold Medicine Cosprin Ketoprofen S-A-C Tablets  Anacin Analgesic Tablets or Capsules Coumadin Korlgesic Salflex  Anacin Extra Strength Analgesic tablets or capsules CP-2 Tablets Lanoril Salicylate  Anaprox Cuprimine Capsules Levenox Salocol  Anexsia-D Dalteparin Magan Salsalate  Anodynos Darvon compound Magnesium Salicylate Sine-off  Ansaid Dasin Capsules Magsal Sodium Salicylate  Anturane Depen Capsules Marnal Soma  APF Arthritis pain formula Dewitt's Pills Measurin Stanback  Argesic Dia-Gesic Meclofenamic Sulfinpyrazone  Arthritis Bayer Timed Release Aspirin Diclofenac Meclomen Sulindac  Arthritis pain formula Anacin Dicumarol Medipren Supac  Analgesic (Safety coated) Arthralgen Diffunasal Mefanamic Suprofen  Arthritis Strength Bufferin Dihydrocodeine Mepro Compound Suprol  Arthropan liquid Dopirydamole Methcarbomol with Aspirin Synalgos  ASA tablets/Enseals Disalcid Micrainin Tagament  Ascriptin Doan's Midol Talwin  Ascriptin A/D Dolene Mobidin Tanderil  Ascriptin Extra Strength  Dolobid Moblgesic Ticlid  Ascriptin with Codeine Doloprin or Doloprin with Codeine Momentum Tolectin  Asperbuf Duoprin Mono-gesic Trendar  Aspergum Duradyne Motrin or Motrin IB Triminicin  Aspirin plain, buffered or enteric coated  Durasal Myochrisine Trigesic  Aspirin Suppositories Easprin Nalfon Trillsate  Aspirin with Codeine Ecotrin Regular or Extra Strength Naprosyn Uracel  Atromid-S Efficin Naproxen Ursinus  Auranofin Capsules Elmiron Neocylate Vanquish  Axotal Emagrin Norgesic Verin  Azathioprine Empirin or Empirin with Codeine Normiflo Vitamin E  Azolid Emprazil Nuprin Voltaren  Bayer Aspirin plain, buffered or children's or timed BC Tablets or powders Encaprin Orgaran Warfarin Sodium  Buff-a-Comp Enoxaparin Orudis Zorpin  Buff-a-Comp with Codeine Equegesic Os-Cal-Gesic   Buffaprin Excedrin plain, buffered or Extra Strength Oxalid   Bufferin Arthritis Strength Feldene Oxphenbutazone   Bufferin plain or Extra Strength Feldene Capsules Oxycodone with Aspirin   Bufferin with Codeine Fenoprofen Fenoprofen Pabalate or Pabalate-SF   Buffets II Flogesic Panagesic   Buffinol plain or Extra Strength Florinal or Florinal with Codeine Panwarfarin   Buf-Tabs Flurbiprofen Penicillamine   Butalbital Compound Four-way cold tablets Penicillin   Butazolidin Fragmin Pepto-Bismol   Carbenicillin Geminisyn Percodan   Carna Arthritis Reliever Geopen Persantine   Carprofen Gold's salt Persistin   Chloramphenicol Goody's Phenylbutazone   Chloromycetin Haltrain Piroxlcam   Clmetidine heparin Plaquenil   Cllnoril Hyco-pap Ponstel   Clofibrate Hydroxy chloroquine Propoxyphen         Before stopping any of these medications, be sure to consult the physician who ordered them.  Some, such as Coumadin (Warfarin) are ordered to prevent or treat serious conditions such as "deep thrombosis", "pumonary embolisms", and other heart problems.  The amount of time that you may need off of the medication may also  vary with the medication and the reason for which you were taking it.  If you are taking any of these medications, please make sure you notify your pain physician before you undergo any procedures.

## 2015-03-16 NOTE — Progress Notes (Signed)
Subjective:    Patient ID: Brent Hamilton, male    DOB: 1961/01/16, 54 y.o.   MRN: [id number]  HPI  Patient 54 year old gentleman returns a [facility] Center for further evaluation and treatment of pain involving the neck upper extremity regions lower back and lower extremity regions. Patient states that pain involving neck and upper extremity regions especially well-controlled at this time. Patient is without significant pain involving the elbow neck wrist or hands. Patient states that he continues to be with some relief of lower back and lower extremity pain from previous lumbar epidural steroid injection. Patient has received greater than 50% relief of pain following his injections. At the present time patient states he is in hopes of being able to undergo treatment at time return appointment at which time patient wished undergo lumbar epidural steroid injection which is felt to be medically necessary procedure in attempt to decrease severity of symptoms, minimize progression of symptoms, and avoid need for more involved treatment. Patient with lumbar lower extremity pain paresthesias and weakness. We will schedule patient for lumbar epidural steroid injection at time return appointment and will continue patient's Dilaudid as well as fentanyl patches. The patient agree with suggested plan.    Review of Systems     Objective:   Physical Exam  There was tenderness of the splenius capitis and occipitalis musculature regions. Palpation of which reproduced mild discomfort. No new masses of the head and neck were noted. Patient appeared to be with unremarkable Spurling's maneuver. There was questionable decreased grip strength on the right compared to the left. There was mild tends to palpation of the medial and lateral epicondyles of the right elbow. Tinel and Phalen's maneuver were without increase of significant pain. There was tenderness over the thoracic and cervical paraspinal musculature  region thoracic and cervical facet regions of on today's visit. Palpation over the thoracic region was without crepitus of the thoracic region. Palpation over the lumbar paraspinal muscles region lumbar facet region was without increased pain of moderate degree. Lateral bending and rotation and extension and palpation over the lumbar facets reproduce moderate discomfort. Straight leg raising was tolerates approximately 30 without a definite increased pain with dorsiflexion noted. Reevaluation was questionable decreased sensation along the L5 dermatomal distribution. There was negative clonus negative Homans. DTRs were difficult to elicit patient had difficulty relaxing. There was mild to moderate tenderness over the PSIS and PII S regions. There was tenderness over the greater trochanteric region iliotibial band region of mild degree. Abdomen nontender and no costovertebral tenderness noted.    Assessment & Plan:    Degenerative disc disease lumbar spine  T12-L1, L1 L2 facet hypertrophy L2-3 facet hypertrophy L4-5 anterior listhesis at this level with mild disc bulging and ligamentous hypertrophy with moderate bilateral L4 foraminal stenosis, L5-S1 facet hypertrophy  Lumbar facet syndrome  Lumbar stenosis with neurogenic claudication  Degenerative disc disease thoracic spine T11-12 circumferential disc bulge T10-11 partially visible degenerative changes of the thoracic  Degenerative disc disease cervical spine  Sacroiliac joint dysfunction  DJD elbows   PLAN   Continue present medication allotted and oxycodone  Lumbar epidural steroid injection to be performed at time return appointment  F/U PCP Manuella Ghazi for evaliation of  BP and general medical  condition  F/U surgical evaluation. May consider pending follow-up evaluations  F/U neurological evaluation. May consider pending follow-up evaluations  F/U rheumatological evaluation  May consider radiofrequency rhizolysis or intraspinal  procedures pending response to present treatment and F/U evaluation  Patient to call Pain Management Center should patient have concerns prior to scheduled return appointment.

## 2015-03-20 ENCOUNTER — Encounter: Payer: Commercial Managed Care - HMO | Admitting: Pain Medicine

## 2015-03-20 ENCOUNTER — Encounter: Payer: Self-pay | Admitting: Family Medicine

## 2015-03-22 ENCOUNTER — Ambulatory Visit (INDEPENDENT_AMBULATORY_CARE_PROVIDER_SITE_OTHER): Payer: Commercial Managed Care - HMO | Admitting: Physician Assistant

## 2015-03-22 ENCOUNTER — Encounter: Payer: Self-pay | Admitting: Physician Assistant

## 2015-03-22 VITALS — BP 122/80 | HR 88 | Temp 98.4°F | Resp 18 | Ht 73.0 in | Wt 272.0 lb

## 2015-03-22 DIAGNOSIS — Z23 Encounter for immunization: Secondary | ICD-10-CM | POA: Diagnosis not present

## 2015-03-22 DIAGNOSIS — E669 Obesity, unspecified: Secondary | ICD-10-CM

## 2015-03-22 DIAGNOSIS — E291 Testicular hypofunction: Secondary | ICD-10-CM

## 2015-03-22 DIAGNOSIS — R7989 Other specified abnormal findings of blood chemistry: Secondary | ICD-10-CM

## 2015-03-22 DIAGNOSIS — E559 Vitamin D deficiency, unspecified: Secondary | ICD-10-CM

## 2015-03-22 DIAGNOSIS — E668 Other obesity: Secondary | ICD-10-CM

## 2015-03-22 DIAGNOSIS — I10 Essential (primary) hypertension: Secondary | ICD-10-CM | POA: Diagnosis not present

## 2015-03-22 DIAGNOSIS — F419 Anxiety disorder, unspecified: Secondary | ICD-10-CM | POA: Diagnosis not present

## 2015-03-22 LAB — COMPLETE METABOLIC PANEL WITH GFR
ALBUMIN: 4.4 g/dL (ref 3.6–5.1)
ALK PHOS: 72 U/L (ref 40–115)
ALT: 60 U/L — AB (ref 9–46)
AST: 37 U/L — ABNORMAL HIGH (ref 10–35)
BILIRUBIN TOTAL: 0.6 mg/dL (ref 0.2–1.2)
BUN: 13 mg/dL (ref 7–25)
CALCIUM: 9.2 mg/dL (ref 8.6–10.3)
CO2: 27 mmol/L (ref 20–31)
CREATININE: 0.93 mg/dL (ref 0.70–1.33)
Chloride: 99 mmol/L (ref 98–110)
Glucose, Bld: 74 mg/dL (ref 70–99)
Potassium: 4.5 mmol/L (ref 3.5–5.3)
Sodium: 136 mmol/L (ref 135–146)
TOTAL PROTEIN: 6.7 g/dL (ref 6.1–8.1)

## 2015-03-22 MED ORDER — LISINOPRIL 40 MG PO TABS: 40.0000 mg | ORAL_TABLET | Freq: Every day | ORAL | Status: DC

## 2015-03-22 MED ORDER — ALPRAZOLAM 0.5 MG PO TABS: 0.5000 mg | ORAL_TABLET | Freq: Three times a day (TID) | ORAL | Status: DC | PRN
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
## 2015-03-23 LAB — VITAMIN D 25 HYDROXY (VIT D DEFICIENCY, FRACTURES): VIT D 25 HYDROXY: 34 ng/mL (ref 30–100)

## 2015-03-23 LAB — TESTOSTERONE: TESTOSTERONE: 118 ng/dL — AB (ref 300–890)

## 2015-03-27 ENCOUNTER — Encounter: Payer: Self-pay | Admitting: Physician Assistant

## 2015-03-27 MED ORDER — TESTOSTERONE CYPIONATE 200 MG/ML IM SOLN: 100.0000 mg | INTRAMUSCULAR | Status: DC

## 2015-03-27 MED ORDER — CHOLECALCIFEROL 100 MCG (4000 UT) PO CAPS: 1.0000 | ORAL_CAPSULE | Freq: Every day | ORAL | Status: DC

## 2015-03-27 MED ORDER — TESTOSTERONE CYPIONATE 200 MG/ML IM SOLN: 200.0000 mg | INTRAMUSCULAR | Status: DC

## 2015-04-12 ENCOUNTER — Ambulatory Visit: Payer: Commercial Managed Care - HMO | Attending: Pain Medicine | Admitting: Pain Medicine

## 2015-04-12 ENCOUNTER — Encounter: Payer: Self-pay | Admitting: Pain Medicine

## 2015-04-12 VITALS — BP 152/74 | HR 68 | Temp 98.3°F | Resp 18 | Ht 73.0 in | Wt 264.0 lb

## 2015-04-12 DIAGNOSIS — Z981 Arthrodesis status: Secondary | ICD-10-CM

## 2015-04-12 DIAGNOSIS — M48062 Spinal stenosis, lumbar region with neurogenic claudication: Secondary | ICD-10-CM

## 2015-04-12 DIAGNOSIS — M533 Sacrococcygeal disorders, not elsewhere classified: Secondary | ICD-10-CM

## 2015-04-12 DIAGNOSIS — M4806 Spinal stenosis, lumbar region: Secondary | ICD-10-CM | POA: Diagnosis not present

## 2015-04-12 DIAGNOSIS — M5126 Other intervertebral disc displacement, lumbar region: Secondary | ICD-10-CM | POA: Insufficient documentation

## 2015-04-12 DIAGNOSIS — M47816 Spondylosis without myelopathy or radiculopathy, lumbar region: Secondary | ICD-10-CM

## 2015-04-12 DIAGNOSIS — M5136 Other intervertebral disc degeneration, lumbar region: Secondary | ICD-10-CM | POA: Diagnosis not present

## 2015-04-12 DIAGNOSIS — M4316 Spondylolisthesis, lumbar region: Secondary | ICD-10-CM | POA: Insufficient documentation

## 2015-04-12 DIAGNOSIS — M51369 Other intervertebral disc degeneration, lumbar region without mention of lumbar back pain or lower extremity pain: Secondary | ICD-10-CM

## 2015-04-12 DIAGNOSIS — M79605 Pain in left leg: Secondary | ICD-10-CM | POA: Diagnosis present

## 2015-04-12 DIAGNOSIS — M545 Low back pain: Secondary | ICD-10-CM | POA: Diagnosis present

## 2015-04-12 DIAGNOSIS — M79604 Pain in right leg: Secondary | ICD-10-CM | POA: Diagnosis present

## 2015-04-12 DIAGNOSIS — M47812 Spondylosis without myelopathy or radiculopathy, cervical region: Secondary | ICD-10-CM

## 2015-04-12 DIAGNOSIS — M5030 Other cervical disc degeneration, unspecified cervical region: Secondary | ICD-10-CM

## 2015-04-12 MED ORDER — ORPHENADRINE CITRATE 30 MG/ML IJ SOLN
60.0000 mg | Freq: Once | INTRAMUSCULAR | Status: AC
  Administered 2015-04-12: 60 mg via INTRAMUSCULAR

## 2015-04-12 MED ORDER — HYDROMORPHONE HCL 4 MG PO TABS: ORAL_TABLET | ORAL | Status: DC

## 2015-04-12 MED ORDER — LIDOCAINE HCL (PF) 1 % IJ SOLN
10.0000 mL | Freq: Once | INTRAMUSCULAR | Status: AC
  Administered 2015-04-12: 5 mL via SUBCUTANEOUS

## 2015-04-12 MED ORDER — CEFAZOLIN SODIUM 1-5 GM-% IV SOLN: 1.0000 g | Freq: Once | INTRAVENOUS | Status: DC

## 2015-04-12 MED ORDER — CEFUROXIME AXETIL 250 MG PO TABS: 250.0000 mg | ORAL_TABLET | Freq: Two times a day (BID) | ORAL | Status: DC

## 2015-04-12 MED ORDER — MIDAZOLAM HCL 5 MG/5ML IJ SOLN
5.0000 mg | Freq: Once | INTRAMUSCULAR | Status: AC
  Administered 2015-04-12: 5 mg via INTRAVENOUS

## 2015-04-12 MED ORDER — BUPIVACAINE HCL (PF) 0.25 % IJ SOLN
INTRAMUSCULAR | Status: AC
  Administered 2015-04-12: 20 mL
  Filled 2015-04-12: qty 30

## 2015-04-12 MED ORDER — SODIUM CHLORIDE 0.9 % IJ SOLN
INTRAMUSCULAR | Status: AC
  Administered 2015-04-12: 4 mL
  Filled 2015-04-12: qty 20

## 2015-04-12 MED ORDER — MIDAZOLAM HCL 5 MG/5ML IJ SOLN
INTRAMUSCULAR | Status: AC
  Administered 2015-04-12: 5 mg via INTRAVENOUS
  Filled 2015-04-12: qty 5

## 2015-04-12 MED ORDER — TRIAMCINOLONE ACETONIDE 40 MG/ML IJ SUSP
INTRAMUSCULAR | Status: AC
  Administered 2015-04-12: 40 mg
  Filled 2015-04-12: qty 1

## 2015-04-12 MED ORDER — LIDOCAINE HCL (PF) 1 % IJ SOLN
INTRAMUSCULAR | Status: AC
  Administered 2015-04-12: 5 mL via SUBCUTANEOUS
  Filled 2015-04-12: qty 5

## 2015-04-12 MED ORDER — FENTANYL CITRATE (PF) 100 MCG/2ML IJ SOLN
100.0000 ug | Freq: Once | INTRAMUSCULAR | Status: DC
  Administered 2015-04-12: 100 ug via INTRAVENOUS

## 2015-04-12 MED ORDER — FENTANYL CITRATE (PF) 100 MCG/2ML IJ SOLN
INTRAMUSCULAR | Status: AC
  Administered 2015-04-12: 100 ug via INTRAVENOUS
  Filled 2015-04-12: qty 2

## 2015-04-12 MED ORDER — ORPHENADRINE CITRATE 30 MG/ML IJ SOLN
INTRAMUSCULAR | Status: AC
  Administered 2015-04-12: 60 mg via INTRAMUSCULAR
  Filled 2015-04-12: qty 2

## 2015-04-12 MED ORDER — CEFAZOLIN SODIUM 1 G IJ SOLR
INTRAMUSCULAR | Status: AC
  Administered 2015-04-12: 1 g via INTRAVENOUS
  Filled 2015-04-12: qty 10

## 2015-04-12 MED ORDER — FENTANYL 50 MCG/HR TD PT72: MEDICATED_PATCH | TRANSDERMAL | Status: DC

## 2015-04-12 MED ORDER — TRIAMCINOLONE ACETONIDE 40 MG/ML IJ SUSP
40.0000 mg | Freq: Once | INTRAMUSCULAR | Status: AC
  Administered 2015-04-12: 40 mg

## 2015-04-12 MED ORDER — LACTATED RINGERS IV SOLN: 1000.0000 mL | INTRAVENOUS | Status: AC

## 2015-04-12 MED ORDER — BUPIVACAINE HCL (PF) 0.25 % IJ SOLN
30.0000 mL | Freq: Once | INTRAMUSCULAR | Status: DC
  Administered 2015-04-12: 20 mL

## 2015-04-12 MED ORDER — SODIUM CHLORIDE 0.9 % IJ SOLN
20.0000 mL | Freq: Once | INTRAMUSCULAR | Status: AC
  Administered 2015-04-12: 4 mL

## 2015-04-12 NOTE — Progress Notes (Signed)
   Subjective:    Patient ID: Brent Hamilton, male    DOB: January 10, 1961, 54 y.o.   MRN: [id number]  HPI  PROCEDURE PERFORMED: Lumbar epidural steroid injection   NOTE: The patient is a 54 y.o. male who returns to [facility] for further evaluation and treatment of pain involving the lumbar and lower extremity region. MRI revealed the patient to be with Degenerative disc disease lumbar spine T12-L1, L1 L2 facet hypertrophy L2-3 facet hypertrophy L4-5 anterior listhesis at this level with mild disc bulging and ligamentous hypertrophy with moderate bilateral L4 foraminal stenosis, L5-S1 facet hypertrophy. The risks, benefits, and expectations of the procedure have been discussed and explained to the patient who was understanding and in agreement with suggested treatment plan. We will proceed with lumbar epidural steroid injection as discussed and as explained to the patient who is willing to proceed with procedure as planned.   DESCRIPTION OF PROCEDURE: Lumbar epidural steroid injection with IV Versed, IV fentanyl conscious sedation, EKG, blood pressure, pulse, and pulse oximetry monitoring. The procedure was performed with the patient in the prone position under fluoroscopic guidance. A local anesthetic skin wheal of 1.5% plain lidocaine was accomplished at proposed entry site. An 18-gauge Tuohy epidural needle was inserted at the L 4 vertebral body level left of the midline via loss-of-resistance technique with negative heme and negative CSF return. A total of 4 mL of Preservative-Free normal saline with 40 mg of Kenalog injected incrementally via epidurally placed needle. Needle was removed.  Myoneural block injections of the lumbar paraspinal musculature region Following Betadine prep of proposed entry site a 22-gauge needle was inserted in the lumbar paraspinal musculature region and following negative aspiration 2 cc of 0.25% bupivacaine with Norflex was injected for myoneural block injection  2   A total of 40 mg of Kenalog was utilized for the procedure.   The patient tolerated the injection well.    PLAN:   1. Medications: We will continue presently prescribed medications Dilaudid and fentanyl patch. 2. Will consider modification of treatment regimen pending response to treatment rendered on today's visit and follow-up evaluation. 3. The patient is to follow-up with primary care physician Dr Doren Custard regarding blood pressure and general medical condition status post lumbar epidural steroid injection performed on today's visit. 4. Surgical evaluation as discussed 5. Neurological evaluation. May consider [facility] EMG studies and other studies . Patient will undergo follow-up rheumatological evaluation as well 6. The patient may be a candidate for radiofrequency procedures, implantation device, and other treatment pending response to treatment and follow-up evaluation. 7. The patient has been advised to adhere to proper body mechanics and avoid activities which appear to aggravate condition. 8. The patient has been advised to call the [facility] prior to scheduled return appointment should there be significant change in condition or should there be sign  The patient is understanding and agrees with the suggested  treatment plan   Review of Systems     Objective:   Physical Exam        Assessment & Plan:

## 2015-04-12 NOTE — Progress Notes (Signed)
Safety precautions to be maintained throughout the outpatient stay will include: orient to surroundings, keep bed in low position, maintain call bell within reach at all times, provide assistance with transfer out of bed and ambulation.  

## 2015-04-12 NOTE — Patient Instructions (Addendum)
PLAN  Continue present medication Dilaudid and fentanyl patch and begin taking antibiotic Ceftin as prescribed. Please obtain your antibiotic today and begin taking antibiotic today  F/U PCP  for evaliation of  BP and general medical  condition.  F/U surgical evaluation. May consider pending follow-up evaluations  F/U neurological evaluation. May consider pending follow-up evaluations  F/U rheumatological evaluation  May consider radiofrequency rhizolysis or intraspinal procedures pending response to present treatment and F/U evaluation.  Patient to call [facility] should patient have concerns prior to scheduled return appointment.  Epidural Steroid Injection An epidural steroid injection is given to relieve pain in your neck, back, or legs that is caused by the irritation or swelling of a nerve root. This procedure involves injecting a steroid and numbing medicine (anesthetic) into the epidural space. The epidural space is the space between the outer covering of your spinal cord and the bones that form your backbone (vertebra).  LET [facility] CARE PROVIDER KNOW ABOUT:   Any allergies you have.  All medicines you are taking, including vitamins, herbs, eye drops, creams, and over-the-counter medicines such as aspirin.  Previous problems you or members of your family have had with the use of anesthetics.  Any blood disorders or blood clotting disorders you have.  Previous surgeries you have had.  Medical conditions you have. RISKS AND COMPLICATIONS Generally, this is a safe procedure. However, as with any procedure, complications can occur. Possible complications of epidural steroid injection include:  Headache.  Bleeding.  Infection.  Allergic reaction to the medicines.  Damage to your nerves. The response to this procedure depends on the underlying cause of the pain and its duration. People who have long-term (chronic) pain are less likely to benefit from  epidural steroids than are those people whose pain comes on strong and suddenly. BEFORE THE PROCEDURE   Ask your health care provider about changing or stopping your regular medicines. You may be advised to stop taking blood-thinning medicines a few days before the procedure.  You may be given medicines to reduce anxiety.  Arrange for someone to take you home after the procedure. PROCEDURE   You will remain awake during the procedure. You may receive medicine to make you relaxed.  You will be asked to lie on your stomach.  The injection site will be cleaned.  The injection site will be numbed with a medicine (local anesthetic).  A needle will be injected through your skin into the epidural space.  Your health care provider will use an X-ray machine to ensure that the steroid is delivered closest to the affected nerve. You may have minimal discomfort at this time.  Once the needle is in the right position, the local anesthetic and the steroid will be injected into the epidural space.  The needle will then be removed and a bandage will be applied to the injection site. AFTER THE PROCEDURE   You may be monitored for a short time before you go home.  You may feel weakness or numbness in your arm or leg, which disappears within hours.  You may be allowed to eat, drink, and take your regular medicine.  You may have soreness at the site of the injection.   This information is not intended to replace advice given to you by your health care provider. Make sure you discuss any questions you have with your health care provider.   Document Released: 09/24/2007 Document Revised: 02/17/2013 Document Reviewed: 12/04/2012 Elsevier Interactive Patient Education [employer].  Pain Management Discharge Instructions  General Discharge Instructions :  If you need to reach your doctor call: Monday-Friday 8:00 am - 4:00 pm at [phone] or toll free [phone].  After clinic hours  [phone] to have operator reach doctor.  Bring all of your medication bottles to all your appointments in the pain clinic.  To cancel or reschedule your appointment with Pain Management please remember to call 24 hours in advance to avoid a fee.  Refer to the educational materials which you have been given on: General Risks, I had my Procedure. Discharge Instructions, Post Sedation.  Post Procedure Instructions:  The drugs you were given will stay in your system until tomorrow, so for the next 24 hours you should not drive, make any legal decisions or drink any alcoholic beverages.  You may eat anything you prefer, but it is better to start with liquids then soups and crackers, and gradually work up to solid foods.  Please notify your doctor immediately if you have any unusual bleeding, trouble breathing or pain that is not related to your normal pain.  Depending on the type of procedure that was done, some parts of your body may feel week and/or numb.  This usually clears up by tonight or the next day.  Walk with the use of an assistive device or accompanied by an adult for the 24 hours.  You may use ice on the affected area for the first 24 hours.  Put ice in a Ziploc bag and cover with a towel and place against area 15 minutes on 15 minutes off.  You may switch to heat after 24 hours.GENERAL RISKS AND COMPLICATIONS  What are the risk, side effects and possible complications? Generally speaking, most procedures are safe.  However, with any procedure there are risks, side effects, and the possibility of complications.  The risks and complications are dependent upon the sites that are lesioned, or the type of nerve block to be performed.  The closer the procedure is to the spine, the more serious the risks are.  Great care is taken when placing the radio frequency needles, block needles or lesioning probes, but sometimes complications can occur.  Infection: Any time there is an injection  through the skin, there is a risk of infection.  This is why sterile conditions are used for these blocks.  There are four possible types of infection.  Localized skin infection.  Central Nervous System Infection-This can be in the form of Meningitis, which can be deadly.  Epidural Infections-This can be in the form of an epidural abscess, which can cause pressure inside of the spine, causing compression of the spinal cord with subsequent paralysis. This would require an emergency surgery to decompress, and there are no guarantees that the patient would recover from the paralysis.  Discitis-This is an infection of the intervertebral discs.  It occurs in about 1% of discography procedures.  It is difficult to treat and it may lead to surgery.        2. Pain: the needles have to go through skin and soft tissues, will cause soreness.       3. Damage to internal structures:  The nerves to be lesioned may be near blood vessels or    other nerves which can be potentially damaged.       4. Bleeding: Bleeding is more common if the patient is taking blood thinners such as  aspirin, Coumadin, Ticiid, Plavix, etc., or if he/she have some genetic predisposition  such  as hemophilia. Bleeding into the spinal canal can cause compression of the spinal  cord with subsequent paralysis.  This would require an emergency surgery to  decompress and there are no guarantees that the patient would recover from the  paralysis.       5. Pneumothorax:  Puncturing of a lung is a possibility, every time a needle is introduced in  the area of the chest or upper back.  Pneumothorax refers to free air around the  collapsed lung(s), inside of the thoracic cavity (chest cavity).  Another two possible  complications related to a similar event would include: Hemothorax and Chylothorax.   These are variations of the Pneumothorax, where instead of air around the collapsed  lung(s), you may have blood or chyle, respectively.       6. Spinal  headaches: They may occur with any procedures in the area of the spine.       7. Persistent CSF (Cerebro-Spinal Fluid) leakage: This is a rare problem, but may occur  with prolonged intrathecal or epidural catheters either due to the formation of a fistulous  track or a dural tear.       8. Nerve damage: By working so close to the spinal cord, there is always a possibility of  nerve damage, which could be as serious as a permanent spinal cord injury with  paralysis.       9. Death:  Although rare, severe deadly allergic reactions known as "Anaphylactic  reaction" can occur to any of the medications used.      10. Worsening of the symptoms:  We can always make thing worse.  What are the chances of something like this happening? Chances of any of this occuring are extremely low.  By statistics, you have more of a chance of getting killed in a motor vehicle accident: while driving to the hospital than any of the above occurring .  Nevertheless, you should be aware that they are possibilities.  In general, it is similar to taking a shower.  Everybody knows that you can slip, hit your head and get killed.  Does that mean that you should not shower again?  Nevertheless always keep in mind that statistics do not mean anything if you happen to be on the wrong side of them.  Even if a procedure has a 1 (one) in a 1,000,000 (million) chance of going wrong, it you happen to be that one..Also, keep in mind that by statistics, you have more of a chance of having something go wrong when taking medications.  Who should not have this procedure? If you are on a blood thinning medication (e.g. Coumadin, Plavix, see list of "Blood Thinners"), or if you have an active infection going on, you should not have the procedure.  If you are taking any blood thinners, please inform your physician.  How should I prepare for this procedure?  Do not eat or drink anything at least six hours prior to the procedure.  Bring a driver  with you .  It cannot be a taxi.  Come accompanied by an adult that can drive you back, and that is strong enough to help you if your legs get weak or numb from the local anesthetic.  Take all of your medicines the morning of the procedure with just enough water to swallow them.  If you have diabetes, make sure that you are scheduled to have your procedure done first thing in the morning, whenever possible.  If you have  diabetes, take only half of your insulin dose and notify our nurse that you have done so as soon as you arrive at the clinic.  If you are diabetic, but only take blood sugar pills (oral hypoglycemic), then do not take them on the morning of your procedure.  You may take them after you have had the procedure.  Do not take aspirin or any aspirin-containing medications, at least eleven (11) days prior to the procedure.  They may prolong bleeding.  Wear loose fitting clothing that may be easy to take off and that you would not mind if it got stained with Betadine or blood.  Do not wear any jewelry or perfume  Remove any nail coloring.  It will interfere with some of our monitoring equipment.  NOTE: Remember that this is not meant to be interpreted as a complete list of all possible complications.  Unforeseen problems may occur.  BLOOD THINNERS The following drugs contain aspirin or other products, which can cause increased bleeding during surgery and should not be taken for 2 weeks prior to and 1 week after surgery.  If you should need take something for relief of minor pain, you may take acetaminophen which is found in Tylenol,m Datril, Anacin-3 and Panadol. It is not blood thinner. The products listed below are.  Do not take any of the products listed below in addition to any listed on your instruction sheet.  A.P.C or A.P.C with Codeine Codeine Phosphate Capsules #3 Ibuprofen Ridaura  ABC compound Congesprin Imuran rimadil  Advil Cope Indocin Robaxisal  Alka-Seltzer  Effervescent Pain Reliever and Antacid Coricidin or Coricidin-D  Indomethacin Rufen  Alka-Seltzer plus Cold Medicine Cosprin Ketoprofen S-A-C Tablets  Anacin Analgesic Tablets or Capsules Coumadin Korlgesic Salflex  Anacin Extra Strength Analgesic tablets or capsules CP-2 Tablets Lanoril Salicylate  Anaprox Cuprimine Capsules Levenox Salocol  Anexsia-D Dalteparin Magan Salsalate  Anodynos Darvon compound Magnesium Salicylate Sine-off  Ansaid Dasin Capsules Magsal Sodium Salicylate  Anturane Depen Capsules Marnal Soma  APF Arthritis pain formula Dewitt's Pills Measurin Stanback  Argesic Dia-Gesic Meclofenamic Sulfinpyrazone  Arthritis Bayer Timed Release Aspirin Diclofenac Meclomen Sulindac  Arthritis pain formula Anacin Dicumarol Medipren Supac  Analgesic (Safety coated) Arthralgen Diffunasal Mefanamic Suprofen  Arthritis Strength Bufferin Dihydrocodeine Mepro Compound Suprol  Arthropan liquid Dopirydamole Methcarbomol with Aspirin Synalgos  ASA tablets/Enseals Disalcid Micrainin Tagament  Ascriptin Doan's Midol Talwin  Ascriptin A/D Dolene Mobidin Tanderil  Ascriptin Extra Strength Dolobid Moblgesic Ticlid  Ascriptin with Codeine Doloprin or Doloprin with Codeine Momentum Tolectin  Asperbuf Duoprin Mono-gesic Trendar  Aspergum Duradyne Motrin or Motrin IB Triminicin  Aspirin plain, buffered or enteric coated Durasal Myochrisine Trigesic  Aspirin Suppositories Easprin Nalfon Trillsate  Aspirin with Codeine Ecotrin Regular or Extra Strength Naprosyn Uracel  Atromid-S Efficin Naproxen Ursinus  Auranofin Capsules Elmiron Neocylate Vanquish  Axotal Emagrin Norgesic Verin  Azathioprine Empirin or Empirin with Codeine Normiflo Vitamin E  Azolid Emprazil Nuprin Voltaren  Bayer Aspirin plain, buffered or children's or timed BC Tablets or powders Encaprin Orgaran Warfarin Sodium  Buff-a-Comp Enoxaparin Orudis Zorpin  Buff-a-Comp with Codeine Equegesic Os-Cal-Gesic   Buffaprin Excedrin  plain, buffered or Extra Strength Oxalid   Bufferin Arthritis Strength Feldene Oxphenbutazone   Bufferin plain or Extra Strength Feldene Capsules Oxycodone with Aspirin   Bufferin with Codeine Fenoprofen Fenoprofen Pabalate or Pabalate-SF   Buffets II Flogesic Panagesic   Buffinol plain or Extra Strength Florinal or Florinal with Codeine Panwarfarin   Buf-Tabs Flurbiprofen Penicillamine   Butalbital Compound Four-way cold  tablets Penicillin   Butazolidin Fragmin Pepto-Bismol   Carbenicillin Geminisyn Percodan   Carna Arthritis Reliever Geopen Persantine   Carprofen Gold's salt Persistin   Chloramphenicol Goody's Phenylbutazone   Chloromycetin Haltrain Piroxlcam   Clmetidine heparin Plaquenil   Cllnoril Hyco-pap Ponstel   Clofibrate Hydroxy chloroquine Propoxyphen         Before stopping any of these medications, be sure to consult the physician who ordered them.  Some, such as Coumadin (Warfarin) are ordered to prevent or treat serious conditions such as "deep thrombosis", "pumonary embolisms", and other heart problems.  The amount of time that you may need off of the medication may also vary with the medication and the reason for which you were taking it.  If you are taking any of these medications, please make sure you notify your pain physician before you undergo any procedures.

## 2015-04-13 ENCOUNTER — Encounter: Payer: Commercial Managed Care - HMO | Admitting: Pain Medicine

## 2015-04-13 ENCOUNTER — Telehealth: Payer: Self-pay | Admitting: *Deleted

## 2015-04-13 NOTE — Telephone Encounter (Signed)
No problems post procedure phone call. 

## 2015-04-14 ENCOUNTER — Telehealth: Payer: Self-pay | Admitting: Physician Assistant

## 2015-04-14 NOTE — Telephone Encounter (Signed)
Patient is calling to discuss the dosage of his testosterone with you  [phone]

## 2015-04-26 NOTE — Telephone Encounter (Signed)
Pharmacy called and dosing was clarified

## 2015-05-04 ENCOUNTER — Telehealth: Payer: Self-pay | Admitting: *Deleted

## 2015-05-04 DIAGNOSIS — F419 Anxiety disorder, unspecified: Secondary | ICD-10-CM

## 2015-05-04 MED ORDER — ALPRAZOLAM 0.5 MG PO TABS: 0.5000 mg | ORAL_TABLET | Freq: Three times a day (TID) | ORAL | Status: DC | PRN

## 2015-05-04 NOTE — Telephone Encounter (Signed)
Rx to pharmacy and pt aware 

## 2015-05-04 NOTE — Telephone Encounter (Signed)
Pt called needs refill on Xanax  Last rf:03/22/15  Last ov: 09/*21/16  [facility] [place]

## 2015-05-04 NOTE — Telephone Encounter (Signed)
Approved for #[id number]--May give NINETY

## 2015-05-11 ENCOUNTER — Ambulatory Visit: Payer: Commercial Managed Care - HMO | Attending: Pain Medicine | Admitting: Pain Medicine

## 2015-05-11 ENCOUNTER — Encounter: Payer: Self-pay | Admitting: Pain Medicine

## 2015-05-11 VITALS — BP 128/76 | HR 92 | Temp 98.0°F | Resp 18 | Ht 73.0 in | Wt 262.0 lb

## 2015-05-11 DIAGNOSIS — M533 Sacrococcygeal disorders, not elsewhere classified: Secondary | ICD-10-CM

## 2015-05-11 DIAGNOSIS — Z981 Arthrodesis status: Secondary | ICD-10-CM

## 2015-05-11 DIAGNOSIS — M5030 Other cervical disc degeneration, unspecified cervical region: Secondary | ICD-10-CM | POA: Diagnosis not present

## 2015-05-11 DIAGNOSIS — M5126 Other intervertebral disc displacement, lumbar region: Secondary | ICD-10-CM | POA: Diagnosis not present

## 2015-05-11 DIAGNOSIS — M4316 Spondylolisthesis, lumbar region: Secondary | ICD-10-CM | POA: Insufficient documentation

## 2015-05-11 DIAGNOSIS — M4806 Spinal stenosis, lumbar region: Secondary | ICD-10-CM | POA: Diagnosis not present

## 2015-05-11 DIAGNOSIS — M5124 Other intervertebral disc displacement, thoracic region: Secondary | ICD-10-CM | POA: Insufficient documentation

## 2015-05-11 DIAGNOSIS — M5134 Other intervertebral disc degeneration, thoracic region: Secondary | ICD-10-CM | POA: Insufficient documentation

## 2015-05-11 DIAGNOSIS — M47816 Spondylosis without myelopathy or radiculopathy, lumbar region: Secondary | ICD-10-CM

## 2015-05-11 DIAGNOSIS — M542 Cervicalgia: Secondary | ICD-10-CM | POA: Diagnosis present

## 2015-05-11 DIAGNOSIS — M79602 Pain in left arm: Secondary | ICD-10-CM | POA: Diagnosis present

## 2015-05-11 DIAGNOSIS — M47812 Spondylosis without myelopathy or radiculopathy, cervical region: Secondary | ICD-10-CM

## 2015-05-11 DIAGNOSIS — M170 Bilateral primary osteoarthritis of knee: Secondary | ICD-10-CM

## 2015-05-11 DIAGNOSIS — M5136 Other intervertebral disc degeneration, lumbar region: Secondary | ICD-10-CM | POA: Diagnosis not present

## 2015-05-11 DIAGNOSIS — M48062 Spinal stenosis, lumbar region with neurogenic claudication: Secondary | ICD-10-CM

## 2015-05-11 DIAGNOSIS — M79601 Pain in right arm: Secondary | ICD-10-CM | POA: Diagnosis present

## 2015-05-11 DIAGNOSIS — M51369 Other intervertebral disc degeneration, lumbar region without mention of lumbar back pain or lower extremity pain: Secondary | ICD-10-CM

## 2015-05-11 MED ORDER — FENTANYL 50 MCG/HR TD PT72: MEDICATED_PATCH | TRANSDERMAL | Status: DC

## 2015-05-11 MED ORDER — HYDROMORPHONE HCL 4 MG PO TABS: ORAL_TABLET | ORAL | Status: DC

## 2015-05-11 NOTE — Patient Instructions (Addendum)
PLAN   Continue present medication Dilaudid and fentanyl patch  Geniculate nerve blocks that need to be performed at time return appointment  F/U PCP Dr.Shah   for evaliation of  BP and general medical  condition  F/U surgical evaluation. May consider pending follow-up evaluations  F/U neurological evaluation. May consider pending follow-up evaluations  F/U rheumatological evaluation as needed  May consider radiofrequency rhizolysis or intraspinal procedures pending response to present treatment and F/U evaluation   Patient to call [facility] Management Center should patient have concerns prior to scheduled return appointment.Pain Management Discharge Instructions  General Discharge Instructions :  If you need to reach your doctor call: Monday-Friday 8:00 am - 4:00 pm at [phone] or toll free [phone].  After clinic hours [phone] to have operator reach doctor.  Bring all of your medication bottles to all your appointments in the pain clinic.  To cancel or reschedule your appointment with Pain Management please remember to call 24 hours in advance to avoid a fee.  Refer to the educational materials which you have been given on: General Risks, I had my Procedure. Discharge Instructions, Post Sedation.  Post Procedure Instructions:  The drugs you were given will stay in your system until tomorrow, so for the next 24 hours you should not drive, make any legal decisions or drink any alcoholic beverages.  You may eat anything you prefer, but it is better to start with liquids then soups and crackers, and gradually work up to solid foods.  Please notify your doctor immediately if you have any unusual bleeding, trouble breathing or pain that is not related to your normal pain.  Depending on the type of procedure that was done, some parts of your body may feel week and/or numb.  This usually clears up by tonight or the next day.  Walk with the use of an assistive device or  accompanied by an adult for the 24 hours.  You may use ice on the affected area for the first 24 hours.  Put ice in a Ziploc bag and cover with a towel and place against area 15 minutes on 15 minutes off.  You may switch to heat after 24 hours.Trigger Point Injection Trigger points are areas where you have muscle pain. A trigger point injection is a shot given in the trigger point to relieve that pain. A trigger point might feel like a knot in your muscle. It hurts to press on a trigger point. Sometimes the pain spreads out (radiates) to other parts of the body. For example, pressing on a trigger point in your shoulder might cause pain in your arm or neck. You might have one trigger point. Or, you might have more than one. People often have trigger points in their upper back and lower back. They also occur often in the neck and shoulders. Pain from a trigger point lasts for a long time. It can make it hard to keep moving. You might not be able to do the exercise or physical therapy that could help you deal with the pain. A trigger point injection may help. It does not work for everyone. But, it may relieve your pain for a few days or a few months. A trigger point injection does not cure long-lasting (chronic) pain. LET YOUR CAREGIVER KNOW ABOUT:  Any allergies (especially to latex, lidocaine, or steroids).  Blood-thinning medicines that you take. These drugs can lead to bleeding or bruising after an injection. They include:  Aspirin.  Ibuprofen.  Clopidogrel.  Warfarin.  Other medicines you take. This includes all vitamins, herbs, eyedrops, over-the-counter medicines, and creams.  Use of steroids.  Recent infections.  Past problems with numbing medicines.  Bleeding problems.  Surgeries you have had.  Other health problems. RISKS AND COMPLICATIONS A trigger point injection is a safe treatment. However, problems may develop, such as:  Minor side effects usually go away in 1 to 2  days. These may include:  Soreness.  Bruising.  Stiffness.  More serious problems are rare. But, they may include:  Bleeding under the skin (hematoma).  Skin infection.  Breaking off of the needle under your skin.  Lung puncture.  The trigger point injection may not work for you. BEFORE THE PROCEDURE You may need to stop taking any medicine that thins your blood. This is to prevent bleeding and bruising. Usually these medicines are stopped several days before the injection. No other preparation is needed. PROCEDURE  A trigger point injection can be given in your caregiver's office or in a clinic. Each injection takes 2 minutes or less.  Your caregiver will feel for trigger points. The caregiver may use a marker to circle the area for the injection.  The skin over the trigger point will be washed with a germ-killing (antiseptic) solution.  The caregiver pinches the spot for the injection.  Then, a very thin needle is used for the shot. You may feel pain or a twitching feeling when the needle enters the trigger point.  A numbing solution may be injected into the trigger point. Sometimes a drug to keep down swelling, redness, and warmth (inflammation) is also injected.  Your caregiver moves the needle around the trigger zone until the tightness and twitching goes away.  After the injection, your caregiver may put gentle pressure over the injection site.  Then it is covered with a bandage. AFTER THE PROCEDURE  You can go right home after the injection.  The bandage can be taken off after a few hours.  You may feel sore and stiff for 1 to 2 days.  Go back to your regular activities slowly. Your caregiver may ask you to stretch your muscles. Do not do anything that takes extra energy for a few days.  Follow your caregiver's instructions to manage and treat other pain.   This information is not intended to replace advice given to you by your health care provider. Make  sure you discuss any questions you have with your health care provider.   Document Released: 06/06/2011 Document Revised: 10/12/2012 Document Reviewed: 06/06/2011 Elsevier Interactive Patient Education 2016 [place]  What are the risk, side effects and possible complications? Generally speaking, most procedures are safe.  However, with any procedure there are risks, side effects, and the possibility of complications.  The risks and complications are dependent upon the sites that are lesioned, or the type of nerve block to be performed.  The closer the procedure is to the spine, the more serious the risks are.  Great care is taken when placing the radio frequency needles, block needles or lesioning probes, but sometimes complications can occur.  Infection: Any time there is an injection through the skin, there is a risk of infection.  This is why sterile conditions are used for these blocks.  There are four possible types of infection.  Localized skin infection.  Central Nervous System Infection-This can be in the form of Meningitis, which can be deadly.  Epidural Infections-This can be in the form of an epidural  abscess, which can cause pressure inside of the spine, causing compression of the spinal cord with subsequent paralysis. This would require an emergency surgery to decompress, and there are no guarantees that the patient would recover from the paralysis.  Discitis-This is an infection of the intervertebral discs.  It occurs in about 1% of discography procedures.  It is difficult to treat and it may lead to surgery.        2. Pain: the needles have to go through skin and soft tissues, will cause soreness.       3. Damage to internal structures:  The nerves to be lesioned may be near blood vessels or    other nerves which can be potentially damaged.       4. Bleeding: Bleeding is more common if the patient is taking blood thinners such as  aspirin,  Coumadin, Ticiid, Plavix, etc., or if he/she have some genetic predisposition  such as hemophilia. Bleeding into the spinal canal can cause compression of the spinal  cord with subsequent paralysis.  This would require an emergency surgery to  decompress and there are no guarantees that the patient would recover from the  paralysis.       5. Pneumothorax:  Puncturing of a lung is a possibility, every time a needle is introduced in  the area of the chest or upper back.  Pneumothorax refers to free air around the  collapsed lung(s), inside of the thoracic cavity (chest cavity).  Another two possible  complications related to a similar event would include: Hemothorax and Chylothorax.   These are variations of the Pneumothorax, where instead of air around the collapsed  lung(s), you may have blood or chyle, respectively.       6. Spinal headaches: They may occur with any procedures in the area of the spine.       7. Persistent CSF (Cerebro-Spinal Fluid) leakage: This is a rare problem, but may occur  with prolonged intrathecal or epidural catheters either due to the formation of a fistulous  track or a dural tear.       8. Nerve damage: By working so close to the spinal cord, there is always a possibility of  nerve damage, which could be as serious as a permanent spinal cord injury with  paralysis.       9. Death:  Although rare, severe deadly allergic reactions known as "Anaphylactic  reaction" can occur to any of the medications used.      10. Worsening of the symptoms:  We can always make thing worse.  What are the chances of something like this happening? Chances of any of this occuring are extremely low.  By statistics, you have more of a chance of getting killed in a motor vehicle accident: while driving to the hospital than any of the above occurring .  Nevertheless, you should be aware that they are possibilities.  In general, it is similar to taking a shower.  Everybody knows that you can slip, hit  your head and get killed.  Does that mean that you should not shower again?  Nevertheless always keep in mind that statistics do not mean anything if you happen to be on the wrong side of them.  Even if a procedure has a 1 (one) in a 1,000,000 (million) chance of going wrong, it you happen to be that one..Also, keep in mind that by statistics, you have more of a chance of having something go wrong when taking medications.  Who should not have this procedure? If you are on a blood thinning medication (e.g. Coumadin, Plavix, see list of "Blood Thinners"), or if you have an active infection going on, you should not have the procedure.  If you are taking any blood thinners, please inform your physician.  How should I prepare for this procedure?  Do not eat or drink anything at least six hours prior to the procedure.  Bring a driver with you .  It cannot be a taxi.  Come accompanied by an adult that can drive you back, and that is strong enough to help you if your legs get weak or numb from the local anesthetic.  Take all of your medicines the morning of the procedure with just enough water to swallow them.  If you have diabetes, make sure that you are scheduled to have your procedure done first thing in the morning, whenever possible.  If you have diabetes, take only half of your insulin dose and notify our nurse that you have done so as soon as you arrive at the clinic.  If you are diabetic, but only take blood sugar pills (oral hypoglycemic), then do not take them on the morning of your procedure.  You may take them after you have had the procedure.  Do not take aspirin or any aspirin-containing medications, at least eleven (11) days prior to the procedure.  They may prolong bleeding.  Wear loose fitting clothing that may be easy to take off and that you would not mind if it got stained with Betadine or blood.  Do not wear any jewelry or perfume  Remove any nail coloring.  It will interfere  with some of our monitoring equipment.  NOTE: Remember that this is not meant to be interpreted as a complete list of all possible complications.  Unforeseen problems may occur.  BLOOD THINNERS The following drugs contain aspirin or other products, which can cause increased bleeding during surgery and should not be taken for 2 weeks prior to and 1 week after surgery.  If you should need take something for relief of minor pain, you may take acetaminophen which is found in Tylenol,m Datril, Anacin-3 and Panadol. It is not blood thinner. The products listed below are.  Do not take any of the products listed below in addition to any listed on your instruction sheet.  A.P.C or A.P.C with Codeine Codeine Phosphate Capsules #3 Ibuprofen Ridaura  ABC compound Congesprin Imuran rimadil  Advil Cope Indocin Robaxisal  Alka-Seltzer Effervescent Pain Reliever and Antacid Coricidin or Coricidin-D  Indomethacin Rufen  Alka-Seltzer plus Cold Medicine Cosprin Ketoprofen S-A-C Tablets  Anacin Analgesic Tablets or Capsules Coumadin Korlgesic Salflex  Anacin Extra Strength Analgesic tablets or capsules CP-2 Tablets Lanoril Salicylate  Anaprox Cuprimine Capsules Levenox Salocol  Anexsia-D Dalteparin Magan Salsalate  Anodynos Darvon compound Magnesium Salicylate Sine-off  Ansaid Dasin Capsules Magsal Sodium Salicylate  Anturane Depen Capsules Marnal Soma  APF Arthritis pain formula Dewitt's Pills Measurin Stanback  Argesic Dia-Gesic Meclofenamic Sulfinpyrazone  Arthritis Bayer Timed Release Aspirin Diclofenac Meclomen Sulindac  Arthritis pain formula Anacin Dicumarol Medipren Supac  Analgesic (Safety coated) Arthralgen Diffunasal Mefanamic Suprofen  Arthritis Strength Bufferin Dihydrocodeine Mepro Compound Suprol  Arthropan liquid Dopirydamole Methcarbomol with Aspirin Synalgos  ASA tablets/Enseals Disalcid Micrainin Tagament  Ascriptin Doan's Midol Talwin  Ascriptin A/D Dolene Mobidin Tanderil  Ascriptin  Extra Strength Dolobid Moblgesic Ticlid  Ascriptin with Codeine Doloprin or Doloprin with Codeine Momentum Tolectin  Asperbuf Duoprin Mono-gesic Trendar  Aspergum Duradyne  Motrin or Motrin IB Triminicin  Aspirin plain, buffered or enteric coated Durasal Myochrisine Trigesic  Aspirin Suppositories Easprin Nalfon Trillsate  Aspirin with Codeine Ecotrin Regular or Extra Strength Naprosyn Uracel  Atromid-S Efficin Naproxen Ursinus  Auranofin Capsules Elmiron Neocylate Vanquish  Axotal Emagrin Norgesic Verin  Azathioprine Empirin or Empirin with Codeine Normiflo Vitamin E  Azolid Emprazil Nuprin Voltaren  Bayer Aspirin plain, buffered or children's or timed BC Tablets or powders Encaprin Orgaran Warfarin Sodium  Buff-a-Comp Enoxaparin Orudis Zorpin  Buff-a-Comp with Codeine Equegesic Os-Cal-Gesic   Buffaprin Excedrin plain, buffered or Extra Strength Oxalid   Bufferin Arthritis Strength Feldene Oxphenbutazone   Bufferin plain or Extra Strength Feldene Capsules Oxycodone with Aspirin   Bufferin with Codeine Fenoprofen Fenoprofen Pabalate or Pabalate-SF   Buffets II Flogesic Panagesic   Buffinol plain or Extra Strength Florinal or Florinal with Codeine Panwarfarin   Buf-Tabs Flurbiprofen Penicillamine   Butalbital Compound Four-way cold tablets Penicillin   Butazolidin Fragmin Pepto-Bismol   Carbenicillin Geminisyn Percodan   Carna Arthritis Reliever Geopen Persantine   Carprofen Gold's salt Persistin   Chloramphenicol Goody's Phenylbutazone   Chloromycetin Haltrain Piroxlcam   Clmetidine heparin Plaquenil   Cllnoril Hyco-pap Ponstel   Clofibrate Hydroxy chloroquine Propoxyphen         Before stopping any of these medications, be sure to consult the physician who ordered them.  Some, such as Coumadin (Warfarin) are ordered to prevent or treat serious conditions such as "deep thrombosis", "pumonary embolisms", and other heart problems.  The amount of time that you may need off of the  medication may also vary with the medication and the reason for which you were taking it.  If you are taking any of these medications, please make sure you notify your pain physician before you undergo any procedures.         Knee Injection A knee injection is a procedure to get medicine into your knee joint. Your health care provider puts a needle into the joint and injects medicine with an attached syringe. The injected medicine may relieve the pain, swelling, and stiffness of arthritis. The injected medicine may also help to lubricate and cushion your knee joint. You may need more than one injection. LET [facility] CARE PROVIDER KNOW ABOUT:  Any allergies you have.  All medicines you are taking, including vitamins, herbs, eye drops, creams, and over-the-counter medicines.  Previous problems you or members of your family have had with the use of anesthetics.  Any blood disorders you have.  Previous surgeries you have had.  Any medical conditions you may have. RISKS AND COMPLICATIONS Generally, this is a safe procedure. However, problems may occur, including:  Infection.  Bleeding.  Worsening symptoms.  Damage to the area around your knee.  Allergic reaction to any of the medicines.  Skin reactions from repeated injections. BEFORE THE PROCEDURE  Ask your health care provider about changing or stopping your regular medicines. This is especially important if you are taking diabetes medicines or blood thinners.  Plan to have someone take you home after the procedure. PROCEDURE  You will sit or lie down in a position for your knee to be treated.  The skin over your kneecap will be cleaned with a germ-killing solution (antiseptic).  You will be given a medicine that numbs the area (local anesthetic). You may feel some stinging.  After your knee becomes numb, you will have a second injection. This is the medicine. This needle is carefully placed between your kneecap and  your knee. The medicine is injected into the joint space.  At the end of the procedure, the needle will be removed.  A bandage (dressing) may be placed over the injection site. The procedure may vary among health care providers and hospitals. AFTER THE PROCEDURE  You may have to move your knee through its full range of motion. This helps to get all of the medicine into your joint space.  Your blood pressure, heart rate, breathing rate, and blood oxygen level will be monitored often until the medicines you were given have worn off.  You will be watched to make sure that you do not have a reaction to the injected medicine.   This information is not intended to replace advice given to you by your health care provider. Make sure you discuss any questions you have with your health care provider.   Document Released: 09/08/2006 Document Revised: 07/08/2014 Document Reviewed: 04/27/2014 Elsevier Interactive Patient Education [employer].

## 2015-05-11 NOTE — Progress Notes (Signed)
Safety precautions to be maintained throughout the outpatient stay will include: orient to surroundings, keep bed in low position, maintain call bell within reach at all times, provide assistance with transfer out of bed and ambulation.  

## 2015-05-11 NOTE — Progress Notes (Signed)
Subjective:    Patient ID: Brent Hamilton, male    DOB: [birth year], 54 y.o.   MRN: [id number]  HPI  The patient is a 54 year old gentleman who returns to pain management for further evaluation and treatment of pain involving the region of the neck upper extremities lower back and lower extremity regions. Patient states that there is significant pain involving the knees at this time. Patient is rheumatoid arthritis and has undergone prior evaluation and treatment of the knees with severe pain occurring in the region of the knee. We discussed patient's condition on today's visit patient is planned surgical intervention of the knee and wishes to undergo geniculate nerve blocks at the knee in attempt to decrease severity of the knee. We will proceed with genetic nerve blocks of the knee at time return appointment and will consider additional modifications of treatment pending follow-up evaluation. The patient stated the pain in the lumbar and lower extremity region is fairly well controlled at this time. Patient also is with moderate pain involving the region of the elbows. We will consider additional modifications of treatment pending response to geniculate nerve blocks at the knee and may consider radio emergency rhizolysis pending response to treatment and follow-up evaluation and treatment of pain involving the region of the knee. The patient agreed to suggested treatment plan.  Review of Systems     Objective:   Physical Exam  There was tenderness to palpation of the splenius capitis at times regions of mild degree with limited range of motion of the cervical spine. There was palpable palpation over the acromioclavicular and glenohumeral joint regions reproducing mild discomfort. Palpation of the medial and lateral epicondyles of the elbow reproduces moderate discomfort. Tinel and Phalen's maneuver associated with mild discomfort. He appeared to be slightly decreased. Palpation of the thoracic facet  thoracic paraspinal muscles reproduces pain of moderate degree with moderate muscle spasms noted in the thoracic region. Palpation over the lumbar paraspinal muscles approximated region reproduces moderate discomfort. Lateral bending and rotation extension and palpation of the lumbar facets reproduced moderate discomfort. There was tenderness to palpation of the knees with crepitus of the knees appeared to be negative anterior and posterior drawer signs without ballottement of the patella. Range of motion maneuvers of the knee reproduces severe pain. Straight leg raising was tolerated to approximately 20 without increased pain with dorsiflexion noted. There was negative clonus negative Homans. Mild tenderness to palpation over the PSIS and P IIS regions. The abdomen is nontender and no costovertebral length is noted.      Assessment & Plan:    Degenerative joint disease knees  Degenerative disc disease lumbar spine  T12-L1, L1 L2 facet hypertrophy L2-3 facet hypertrophy L4-5 anterior listhesis at this level with mild disc bulging and ligamentous hypertrophy with moderate bilateral L4 foraminal stenosis, L5-S1 facet hypertrophy  Lumbar facet syndrome  Lumbar stenosis with neurogenic claudication  Degenerative disc disease thoracic spine T11-12 circumferential disc bulge T10-11 partially visible degenerative changes of the thoracic  Degenerative disc disease cervical spine  Sacroiliac joint dysfunction    PLAN   Continue present medication Dilaudid and fentanyl patch  Geniculate nerve blocks that need to be performed at time return appointment  F/U PCP Dr.Shah   for evaliation of  BP and general medical  condition  F/U surgical evaluation. May consider pending follow-up evaluations  F/U neurological evaluation. May consider pending follow-up evaluations  F/U rheumatological evaluation as needed  May consider radiofrequency rhizolysis or intraspinal procedures pending response to  present treatment and F/U evaluation   Patient to call Pain Management Center should patient have concerns prior to scheduled return appointment

## 2015-05-31 ENCOUNTER — Encounter: Payer: Self-pay | Admitting: Pain Medicine

## 2015-06-01 ENCOUNTER — Other Ambulatory Visit: Payer: Self-pay | Admitting: Pain Medicine

## 2015-06-01 ENCOUNTER — Ambulatory Visit: Payer: Commercial Managed Care - HMO | Admitting: Physician Assistant

## 2015-06-05 ENCOUNTER — Ambulatory Visit: Payer: Commercial Managed Care - HMO | Admitting: Physician Assistant

## 2015-06-07 ENCOUNTER — Encounter: Payer: Self-pay | Admitting: Pain Medicine

## 2015-06-07 ENCOUNTER — Ambulatory Visit: Payer: Commercial Managed Care - HMO | Attending: Pain Medicine | Admitting: Pain Medicine

## 2015-06-07 VITALS — BP 142/81 | HR 79 | Temp 98.0°F | Resp 15 | Ht 73.0 in | Wt 264.0 lb

## 2015-06-07 DIAGNOSIS — M5124 Other intervertebral disc displacement, thoracic region: Secondary | ICD-10-CM | POA: Diagnosis not present

## 2015-06-07 DIAGNOSIS — M5134 Other intervertebral disc degeneration, thoracic region: Secondary | ICD-10-CM | POA: Diagnosis not present

## 2015-06-07 DIAGNOSIS — M4806 Spinal stenosis, lumbar region: Secondary | ICD-10-CM | POA: Diagnosis not present

## 2015-06-07 DIAGNOSIS — M5030 Other cervical disc degeneration, unspecified cervical region: Secondary | ICD-10-CM | POA: Insufficient documentation

## 2015-06-07 DIAGNOSIS — M170 Bilateral primary osteoarthritis of knee: Secondary | ICD-10-CM

## 2015-06-07 DIAGNOSIS — M5136 Other intervertebral disc degeneration, lumbar region: Secondary | ICD-10-CM | POA: Insufficient documentation

## 2015-06-07 DIAGNOSIS — M79604 Pain in right leg: Secondary | ICD-10-CM | POA: Diagnosis present

## 2015-06-07 DIAGNOSIS — M4316 Spondylolisthesis, lumbar region: Secondary | ICD-10-CM | POA: Insufficient documentation

## 2015-06-07 DIAGNOSIS — M47814 Spondylosis without myelopathy or radiculopathy, thoracic region: Secondary | ICD-10-CM | POA: Insufficient documentation

## 2015-06-07 DIAGNOSIS — M533 Sacrococcygeal disorders, not elsewhere classified: Secondary | ICD-10-CM | POA: Diagnosis not present

## 2015-06-07 DIAGNOSIS — Z981 Arthrodesis status: Secondary | ICD-10-CM

## 2015-06-07 DIAGNOSIS — M545 Low back pain: Secondary | ICD-10-CM | POA: Diagnosis present

## 2015-06-07 DIAGNOSIS — M47816 Spondylosis without myelopathy or radiculopathy, lumbar region: Secondary | ICD-10-CM

## 2015-06-07 DIAGNOSIS — M48062 Spinal stenosis, lumbar region with neurogenic claudication: Secondary | ICD-10-CM

## 2015-06-07 DIAGNOSIS — M79605 Pain in left leg: Secondary | ICD-10-CM | POA: Diagnosis present

## 2015-06-07 DIAGNOSIS — M47812 Spondylosis without myelopathy or radiculopathy, cervical region: Secondary | ICD-10-CM

## 2015-06-07 DIAGNOSIS — M5126 Other intervertebral disc displacement, lumbar region: Secondary | ICD-10-CM | POA: Insufficient documentation

## 2015-06-07 MED ORDER — HYDROMORPHONE HCL 4 MG PO TABS: ORAL_TABLET | ORAL | Status: DC

## 2015-06-07 MED ORDER — FENTANYL 50 MCG/HR TD PT72: MEDICATED_PATCH | TRANSDERMAL | Status: DC

## 2015-06-07 NOTE — Progress Notes (Signed)
Subjective:    Patient ID: Brent Hamilton, male    DOB: 05-Dec-1960, 54 y.o.   MRN: [id number]  HPI  The patient is a 54 year old gentleman who returns to pain management for further evaluation and treatment of pain involving the lower back and lower extremity region predominantly with history of rheumatoid arthritis with multiple regions including the neck and upper extremity regions with significant pain. At the present time patient states that the pain radiates from the lumbar region toward the buttocks and continues to the knee and distal to the knee to the foot on the left lower extremity. Patient states the pain is aggravated by standing walking and becomes more intense as the day progresses. The patient is in hopes of being able to undergo lumbar epidural steroid injection at time return appointment in attempt to decrease severity of symptoms, minimize progression of symptoms, and avoid need for more involved treatment. We will request insurance permission to proceed with lumbar epidural steroid injection and patient will continue fentanyl patch and Dilaudid for breakthrough pain. The patient agreed to suggested treatment plan       Review of Systems     Objective:   Physical Exam There was tenderness to palpation of the splenius capitis and occipitalis musculature regions of mild to moderate degree with palpation over the cervical facet cervical paraspinal musculature region reproducing mild to moderate discomfort. There appeared to be unremarkable Spurling's maneuver. Patient appeared to be with decreased grip strength of the right upper extremity. There was tends to palpation of the epicondyles of the elbow of moderate to moderately severe degree right upper extremity especially. Tinel and Phalen's maneuver were without increased pain of significant degree. Patient was decreased grip strength right compared to the left. Palpation over the thoracic facet thoracic paraspinal muscles  reproduces pain of moderate degree. No crepitus of the thoracic region noted. Palpation over the lumbar paraspinal muscles lumbar facet region associated with moderate to moderately severe degree with lateral bending rotation extension and palpation of the lumbar facets reproducing moderate to moderately severe degree discomfort. Straight leg raising was tolerates approximately 20 without a definite increased pain with dorsiflexion noted. There was tends to palpation of the knees with crepitus of the knees with negative anterior and posterior drawer signs without ballottement of without ballottement of the patella there was significant increased pain with range of motion maneuvers of the knee especially the left knee there was negative clonus negative Homans. Abdomen nontender with no costovertebral tenderness noted.       Assessment & Plan:    Degenerative joint disease knees  Degenerative disc disease lumbar spine  T12-L1, L1 L2 facet hypertrophy L2-3 facet hypertrophy L4-5 anterior listhesis at this level with mild disc bulging and ligamentous hypertrophy with moderate bilateral L4 foraminal stenosis, L5-S1 facet hypertrophy  Lumbar facet syndrome  Lumbar stenosis with neurogenic claudication  Degenerative disc disease thoracic spine T11-12 circumferential disc bulge T10-11 partially visible degenerative changes of the thoracic  Degenerative disc disease cervical spine  Sacroiliac joint dysfunction      PLAN   Continue present medication Dilaudid and fentanyl patch  Lumbar epidural steroid injection to be performed at time of return appointment instead of injection of knee provided insurance approves lumbar epidural steroid injection  F/U PCP Dr.Shah   for evaliation of  BP and general medical  condition  F/U surgical evaluation. May consider pending follow-up evaluations. Surgical evaluation of knees to be considered as discussed  F/U neurological evaluation. May consider  pending follow-up evaluations  F/U rheumatological evaluation as needed  May consider radiofrequency rhizolysis or intraspinal procedures pending response to present treatment and F/U evaluation   Patient to call [facility] should patient have concerns prior to scheduled return appointment

## 2015-06-07 NOTE — Progress Notes (Signed)
Safety precautions to be maintained throughout the outpatient stay will include: orient to surroundings, keep bed in low position, maintain call bell within reach at all times, provide assistance with transfer out of bed and ambulation.  

## 2015-06-07 NOTE — Patient Instructions (Addendum)
PLAN   Continue present medication Dilaudid and fentanyl patch  Lumbar epidural steroid injection to be performed at time of return appointment instead of injection of knee provided insurance approves lumbar epidural steroid injection  F/U PCP Dr.Shah   for evaliation of  BP and general medical  condition  F/U surgical evaluation. May consider pending follow-up evaluations  F/U neurological evaluation. May consider pending follow-up evaluations  F/U rheumatological evaluation as needed  May consider radiofrequency rhizolysis or intraspinal procedures pending response to present treatment and F/U evaluation   Patient to call [facility] Center should patient have concerns prior to scheduled return appointment.Epidural Steroid Injection Patient Information  Description: The epidural space surrounds the nerves as they exit the spinal cord.  In some patients, the nerves can be compressed and inflamed by a bulging disc or a tight spinal canal (spinal stenosis).  By injecting steroids into the epidural space, we can bring irritated nerves into direct contact with a potentially helpful medication.  These steroids act directly on the irritated nerves and can reduce swelling and inflammation which often leads to decreased pain.  Epidural steroids may be injected anywhere along the spine and from the neck to the low back depending upon the location of your pain.   After numbing the skin with local anesthetic (like Novocaine), a small needle is passed into the epidural space slowly.  You may experience a sensation of pressure while this is being done.  The entire block usually last less than 10 minutes.  Conditions which may be treated by epidural steroids:   Low back and leg pain  Neck and arm pain  Spinal stenosis  Post-laminectomy syndrome  Herpes zoster (shingles) pain  Pain from compression fractures  Preparation for the injection:  1. Do not eat any solid food or dairy products  within 6 hours of your appointment.  2. You may drink clear liquids up to 2 hours before appointment.  Clear liquids include water, black coffee, juice or soda.  No milk or cream please. 3. You may take your regular medication, including pain medications, with a sip of water before your appointment  Diabetics should hold regular insulin (if taken separately) and take 1/2 normal NPH dos the morning of the procedure.  Carry some sugar containing items with you to your appointment. 4. A driver must accompany you and be prepared to drive you home after your procedure.  5. Bring all your current medications with your. 6. An IV may be inserted and sedation may be given at the discretion of the physician.   7. A blood pressure cuff, EKG and other monitors will often be applied during the procedure.  Some patients may need to have extra oxygen administered for a short period. 8. You will be asked to provide medical information, including your allergies, prior to the procedure.  We must know immediately if you are taking blood thinners (like Coumadin/Warfarin)  Or if you are allergic to IV iodine contrast (dye). We must know if you could possible be pregnant.  Possible side-effects:  Bleeding from needle site  Infection (rare, may require surgery)  Nerve injury (rare)  Numbness & tingling (temporary)  Difficulty urinating (rare, temporary)  Spinal headache ( a headache worse with upright posture)  Light -headedness (temporary)  Pain at injection site (several days)  Decreased blood pressure (temporary)  Weakness in arm/leg (temporary)  Pressure sensation in back/neck (temporary)  Call if you experience:  Fever/chills associated with headache or increased back/neck pain.  Headache worsened by an upright position.  New onset weakness or numbness of an extremity below the injection site  Hives or difficulty breathing (go to the emergency room)  Inflammation or drainage at the infection  site  Severe back/neck pain  Any new symptoms which are concerning to you  Please note:  Although the local anesthetic injected can often make your back or neck feel good for several hours after the injection, the pain will likely return.  It takes 3-7 days for steroids to work in the epidural space.  You may not notice any pain relief for at least that one week.  If effective, we will often do a series of three injections spaced 3-6 weeks apart to maximally decrease your pain.  After the initial series, we generally will wait several months before considering a repeat injection of the same type.  If you have any questions, please call [phone] [facility]

## 2015-06-19 ENCOUNTER — Encounter: Payer: Self-pay | Admitting: Physician Assistant

## 2015-06-19 ENCOUNTER — Ambulatory Visit (INDEPENDENT_AMBULATORY_CARE_PROVIDER_SITE_OTHER): Payer: Commercial Managed Care - HMO | Admitting: Physician Assistant

## 2015-06-19 VITALS — BP 166/96 | HR 80 | Temp 98.5°F | Resp 18 | Wt 281.0 lb

## 2015-06-19 DIAGNOSIS — F329 Major depressive disorder, single episode, unspecified: Secondary | ICD-10-CM | POA: Insufficient documentation

## 2015-06-19 DIAGNOSIS — F32A Depression, unspecified: Secondary | ICD-10-CM | POA: Insufficient documentation

## 2015-06-19 DIAGNOSIS — F419 Anxiety disorder, unspecified: Secondary | ICD-10-CM | POA: Diagnosis not present

## 2015-06-19 MED ORDER — ALPRAZOLAM 0.5 MG PO TABS: 0.5000 mg | ORAL_TABLET | Freq: Three times a day (TID) | ORAL | Status: DC | PRN

## 2015-06-19 MED ORDER — BUPROPION HCL ER (XL) 150 MG PO TB24: 150.0000 mg | ORAL_TABLET | Freq: Every day | ORAL | Status: DC

## 2015-06-19 NOTE — Progress Notes (Signed)
Patient ID: Brent Hamilton MRN: [id number], DOB: [birth year], 54 y.o. Date of Encounter: 06/19/2015, 1:08 PM    Chief Complaint:  Chief Complaint  Patient presents with  . discuss depression     HPI: 54 y.o. year old white male here for above.   He has only had one OV with me prior to today--9/2/12016---I reviewed that note today. See that note regarding history regarding anxiety/panic and use of Xanax.   Today he states that 1 week prior to Thanksgiving, he "was canned" regarding his job. 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  Wasn't wanting to do anything--not even go with family out to eat.  Says that 2 years ago, he felt similar when his dad passed away. At that time he was prescried Wellbutrin. That worked well for him. Says he found his old bottle and started that. Is noticing improvement in symptoms already. Is getting up in mornings, etc.   Also, needs refill on Xanax. Last Rx was 05/04/15 for # 90 + 0. Says he uses this as sparingly as he can, but recently has been having to take one every day and one every night to sleep.      Home Meds:   Outpatient Prescriptions Prior to Visit  Medication Sig Dispense Refill  . cholecalciferol (VITAMIN D) 1000 UNITS tablet Take 1,000 Units by mouth.    . fentaNYL (DURAGESIC - DOSED MCG/HR) 50 MCG/HR Apply 1 patch to skin every other day if tolerated 15 patch 0  . FIBER PO Take 1 tablet by mouth 2 (two) times daily.    Marland Kitchen HYDROmorphone (DILAUDID) 4 MG tablet Limit one half to one tablet by mouth per day or twice per day if tolerated if tolerated for breakthrough pain while wearing fentanyl patch 60 tablet 0  . lisinopril (PRINIVIL,ZESTRIL) 40 MG  tablet Take 1 tablet (40 mg total) by mouth daily. 90 tablet 1  . testosterone cypionate (DEPOTESTOSTERONE CYPIONATE) 200 MG/ML injection Inject 1 mL (200 mg total) into the muscle every 14 (fourteen) days. 10 mL 1  . ALPRAZolam (XANAX) 0.5 MG tablet Take 1 tablet (0.5 mg total) by mouth 3 (three) times daily as needed for anxiety. 90 tablet 0  . HYDROmorphone (DILAUDID) 4 MG tablet Limit one half to one tablet by mouth per day or 2-3 times per day if tolerated if tolerated for breakthrough pain while wearing fentanyl patch 80 tablet 0   Facility-Administered Medications Prior to Visit  Medication Dose Route Frequency Provider Last Rate Last Dose  . lactated ringers infusion 1,000 mL  1,000 mL Intravenous Continuous Mohammed Kindle, MD        Allergies:  Allergies  Allergen Reactions  . Flonase [Fluticasone] Swelling, Anaphylaxis and Rash  . Oxycodone Swelling  . Morphine And Related Itching and Rash    Rash developed after about 10 days of taking the tablets      Review of Systems: See HPI for pertinent ROS. All other ROS negative.    Physical Exam: Blood pressure 166/96, pulse 80, temperature 98.5 F (36.9 C), temperature source Oral, resp. rate 18, weight 281 lb (127.461 kg)., Body mass index is 37.08 kg/(m^2). General:  WNWD  WM. Appears in no acute distress. Neck: Supple. No thyromegaly. No lymphadenopathy. Lungs: Clear bilaterally to auscultation without wheezes, rales, or rhonchi. Breathing is unlabored. Heart: Regular rhythm. No murmurs, rubs, or gallops. Msk:  Strength and tone normal for age. Extremities/Skin: Warm and dry. Neuro: Alert and oriented X 3. Moves all extremities spontaneously. Gait is normal. CNII-XII grossly in tact. Psych:  Responds to questions appropriately with a normal affect.     ASSESSMENT AND PLAN:  54 y.o. year old male with  1. Depression He is to take Wellbutrin as directed. If feels worse or develops adverse effects, call us immediately.  Otherwise, will plan f/u OV 2 months.  - buPROPion (WELLBUTRIN XL) 150 MG 24 hr tablet; Take 1 tablet (150 mg total) by mouth daily.  Dispense: 30 tablet; Refill: 3  2. Anxiety He knows to use this as sparingly as possible.  - ALPRAZolam (XANAX) 0.5 MG tablet; Take 1 tablet (0.5 mg total) by mouth 3 (three) times daily as needed for anxiety.  Dispense: 90 tablet; Refill: 1   Signed, [address] [place], [place], [facility] 06/19/2015 1:08 PM

## 2015-07-06 ENCOUNTER — Ambulatory Visit: Payer: PPO | Attending: Pain Medicine | Admitting: Pain Medicine

## 2015-07-06 ENCOUNTER — Encounter: Payer: Self-pay | Admitting: Pain Medicine

## 2015-07-06 VITALS — BP 141/90 | HR 86 | Temp 98.1°F | Resp 18 | Ht 73.0 in | Wt 264.0 lb

## 2015-07-06 DIAGNOSIS — M5126 Other intervertebral disc displacement, lumbar region: Secondary | ICD-10-CM | POA: Diagnosis not present

## 2015-07-06 DIAGNOSIS — M48062 Spinal stenosis, lumbar region with neurogenic claudication: Secondary | ICD-10-CM

## 2015-07-06 DIAGNOSIS — M170 Bilateral primary osteoarthritis of knee: Secondary | ICD-10-CM

## 2015-07-06 DIAGNOSIS — M5124 Other intervertebral disc displacement, thoracic region: Secondary | ICD-10-CM | POA: Diagnosis not present

## 2015-07-06 DIAGNOSIS — M51369 Other intervertebral disc degeneration, lumbar region without mention of lumbar back pain or lower extremity pain: Secondary | ICD-10-CM

## 2015-07-06 DIAGNOSIS — M4806 Spinal stenosis, lumbar region: Secondary | ICD-10-CM | POA: Insufficient documentation

## 2015-07-06 DIAGNOSIS — M5134 Other intervertebral disc degeneration, thoracic region: Secondary | ICD-10-CM | POA: Insufficient documentation

## 2015-07-06 DIAGNOSIS — M79601 Pain in right arm: Secondary | ICD-10-CM | POA: Diagnosis not present

## 2015-07-06 DIAGNOSIS — M5030 Other cervical disc degeneration, unspecified cervical region: Secondary | ICD-10-CM | POA: Diagnosis not present

## 2015-07-06 DIAGNOSIS — M47817 Spondylosis without myelopathy or radiculopathy, lumbosacral region: Secondary | ICD-10-CM | POA: Diagnosis not present

## 2015-07-06 DIAGNOSIS — M79602 Pain in left arm: Secondary | ICD-10-CM | POA: Diagnosis not present

## 2015-07-06 DIAGNOSIS — M533 Sacrococcygeal disorders, not elsewhere classified: Secondary | ICD-10-CM | POA: Diagnosis not present

## 2015-07-06 DIAGNOSIS — M47816 Spondylosis without myelopathy or radiculopathy, lumbar region: Secondary | ICD-10-CM

## 2015-07-06 DIAGNOSIS — M5136 Other intervertebral disc degeneration, lumbar region: Secondary | ICD-10-CM | POA: Diagnosis not present

## 2015-07-06 DIAGNOSIS — M47812 Spondylosis without myelopathy or radiculopathy, cervical region: Secondary | ICD-10-CM

## 2015-07-06 DIAGNOSIS — Z981 Arthrodesis status: Secondary | ICD-10-CM

## 2015-07-06 DIAGNOSIS — M5416 Radiculopathy, lumbar region: Secondary | ICD-10-CM | POA: Diagnosis not present

## 2015-07-06 DIAGNOSIS — M791 Myalgia: Secondary | ICD-10-CM | POA: Diagnosis not present

## 2015-07-06 DIAGNOSIS — M542 Cervicalgia: Secondary | ICD-10-CM | POA: Diagnosis not present

## 2015-07-06 DIAGNOSIS — M47814 Spondylosis without myelopathy or radiculopathy, thoracic region: Secondary | ICD-10-CM | POA: Insufficient documentation

## 2015-07-06 MED ORDER — HYDROMORPHONE HCL 4 MG PO TABS: ORAL_TABLET | ORAL | Status: DC

## 2015-07-06 MED ORDER — FENTANYL 50 MCG/HR TD PT72: MEDICATED_PATCH | TRANSDERMAL | Status: DC

## 2015-07-06 NOTE — Progress Notes (Signed)
Safety precautions to be maintained throughout the outpatient stay will include: orient to surroundings, keep bed in low position, maintain call bell within reach at all times, provide assistance with transfer out of bed and ambulation.  

## 2015-07-06 NOTE — Progress Notes (Signed)
   Subjective:    Patient ID: Brent Hamilton, male    DOB: Jun 21, 1961, 55 y.o.   MRN: [id number]  HPI The patient is a 55 year old gentleman who returns to pain management for further evaluation and treatment of pain involving the region of the neck upper extremity regions including the regions of the elbows as well as lower back and lower extremity pain. Patient states that he has had return of significant lower back and lower extremity pain which is aggravated with standing walking twisting turning maneuvers with pain becoming more intense as the day progresses. Patient denies any trauma change in events of daily living the call significant change in symptomatology. The patient continues to work at this time. We will continue present medications including fentanyl patch with Dilaudid for breakthrough pain and will proceed with lumbar facet, medial branch nerve, blocks at time return appointment in attempt to decrease severity of patient's symptoms, minimize progression of symptoms, and avoid the need for more involved treatment. The patient was in agreement with suggested treatment plan.      Review of Systems     Objective:   Physical Exam  There was tends to palpation over the splenius capitis and occipitalis musculature regions palpation which reproduces mild discomfort. There was mild tenderness to palpation of the cervical facet cervical paraspinal musculature region. Palpation of the acromioclavicular and glenohumeral joint regions reproduce mild discomfort as well. Palpation over the thoracic facet thoracic paraspinal musculature region was attends to palpation of mild to moderate degree with no crepitus of the thoracic region noted. Tinel and Phalen's maneuver were without increased pain of significant degree and patient was a slightly decreased grip strength. There was tenderness to palpation of the medial and lateral epicondyles of the elbows. Patient over the lumbar paraspinal  musculatures and lumbar facet region was attends to palpation with lateral bending rotation extension and palpation of the lumbar facets reproducing moderate to moderately severe discomfort. Straight leg raise was limited to approximately 20 without increased pain with dorsiflexion noted. EHL strength appeared to be decreased. DTRs were difficult to elicit patient difficult relaxing. There was tenderness over the PSIS and PII S region a moderate degree. Palpation over the greater trochanteric region iliotibial band region reproduced moderate discomfort. No definite sensory deficit or dermatomal speech detected. There was negative clonus negative Homans. Abdomen nontender with no costovertebral tenderness noted.      Assessment & Plan:    Degenerative joint disease knees  Degenerative disc disease lumbar spine  T12-L1, L1 L2 facet hypertrophy L2-3 facet hypertrophy L4-5 anterior listhesis at this level with mild disc bulging and ligamentous hypertrophy with moderate bilateral L4 foraminal stenosis, L5-S1 facet hypertrophy  Lumbar facet syndrome  Lumbar stenosis with neurogenic claudication  Degenerative disc disease thoracic spine T11-12 circumferential disc bulge T10-11 partially visible degenerative changes of the thoracic  Degenerative disc disease cervical spine     PLAN   Continue present medications  Dilaudid and fentanyl patch  Lumbar facet, medial branch nerve, blocks to be performed at time return appointment  F/U PCP Dr.Shah   for evaliation of  BP and general medical  condition  F/U surgical evaluation. May consider pending follow-up evaluations  F/U neurological evaluation. May consider pending follow-up evaluations  F/U rheumatological evaluation as discussed  May consider radiofrequency rhizolysis or intraspinal procedures pending response to present treatment and F/U evaluation   Patient to call Pain Management Center should patient have concerns prior to  scheduled return appointment

## 2015-07-06 NOTE — Patient Instructions (Addendum)
PLAN   Continue present medication Dilaudid and fentanyl patch  Lumbar facet, medial branch nerve, blocks to be performed at time return appointment  F/U PCP Dr.Shah   for evaliation of  BP and general medical  condition  F/U surgical evaluation. May consider pending follow-up evaluations  F/U neurological evaluation. May consider pending follow-up evaluations  F/U rheumatological evaluation as needed  May consider radiofrequency rhizolysis or intraspinal procedures pending response to present treatment and F/U evaluation   Patient to call Pain Management Center should patient have concerns prior to scheduled return appointment.Facet Joint Block The facet joints connect the bones of the spine (vertebrae). They make it possible for you to bend, twist, and make other movements with your spine. They also prevent you from overbending, overtwisting, and making other excessive movements.  A facet joint block is a procedure where a numbing medicine (anesthetic) is injected into a facet joint. Often, a type of anti-inflammatory medicine called a steroid is also injected. A facet joint block may be done for two reasons:   Diagnosis. A facet joint block may be done as a test to see whether neck or back pain is caused by a worn-down or infected facet joint. If the pain gets better after a facet joint block, it means the pain is probably coming from the facet joint. If the pain does not get better, it means the pain is probably not coming from the facet joint.   Therapy. A facet joint block may be done to relieve neck or back pain caused by a facet joint. A facet joint block is only done as a therapy if the pain does not improve with medicine, exercise programs, physical therapy, and other forms of pain management. LET [facility] CARE PROVIDER KNOW ABOUT:   Any allergies you have.   All medicines you are taking, including vitamins, herbs, eyedrops, and over-the-counter medicines and creams.    Previous problems you or members of your family have had with the use of anesthetics.   Any blood disorders you have had.   Other health problems you have. RISKS AND COMPLICATIONS Generally, having a facet joint block is safe. However, as with any procedure, complications can occur. Possible complications associated with having a facet joint block include:   Bleeding.   Injury to a nerve near the injection site.   Pain at the injection site.   Weakness or numbness in areas controlled by nerves near the injection site.   Infection.   Temporary fluid retention.   Allergic reaction to anesthetics or medicines used during the procedure. BEFORE THE PROCEDURE   Follow your health care provider's instructions if you are taking dietary supplements or medicines. You may need to stop taking them or reduce your dosage.   Do not take any new dietary supplements or medicines without asking your health care provider first.   Follow your health care provider's instructions about eating and drinking before the procedure. You may need to stop eating and drinking several hours before the procedure.   Arrange to have an adult drive you home after the procedure. PROCEDURE  You may need to remove your clothing and dress in an open-back gown so that your health care provider can access your spine.   The procedure will be done while you are lying on an X-ray table. Most of the time you will be asked to lie on your stomach, but you may be asked to lie in a different position if an injection will be made  in your neck.   Special machines will be used to monitor your oxygen levels, heart rate, and blood pressure.   If an injection will be made in your neck, an intravenous (IV) tube will be inserted into one of your veins. Fluids and medicine will flow directly into your body through the IV tube.   The area over the facet joint where the injection will be made will be cleaned with an  antiseptic soap. The surrounding skin will be covered with sterile drapes.   An anesthetic will be applied to your skin to make the injection area numb. You may feel a temporary stinging or burning sensation.   A video X-ray machine will be used to locate the joint. A contrast dye may be injected into the facet joint area to help with locating the joint.   When the joint is located, an anesthetic medicine will be injected into the joint through the needle.   Your health care provider will ask you whether you feel pain relief. If you do feel relief, a steroid may be injected to provide pain relief for a longer period of time. If you do not feel relief or feel only partial relief, additional injections of an anesthetic may be made in other facet joints.   The needle will be removed, the skin will be cleansed, and bandages will be applied.  AFTER THE PROCEDURE   You will be observed for 15-30 minutes before being allowed to go home. Do not drive. Have an adult drive you or take a taxi or public transportation instead.   If you feel pain relief, the pain will return in several hours or days when the anesthetic wears off.   You may feel pain relief 2-14 days after the procedure. The amount of time this relief lasts varies from person to person.   It is normal to feel some tenderness over the injected area(s) for 2 days following the procedure.   If you have diabetes, you may have a temporary increase in blood sugar.   This information is not intended to replace advice given to you by your health care provider. Make sure you discuss any questions you have with your health care provider.   Document Released: 11/06/2006 Document Revised: 07/08/2014 Document Reviewed: 04/06/2012 Elsevier Interactive Patient Education [employer].

## 2015-07-17 ENCOUNTER — Other Ambulatory Visit: Payer: Self-pay | Admitting: Physician Assistant

## 2015-07-17 NOTE — Telephone Encounter (Signed)
LRF 06/19/15  #90  LOV 06/19/15  OK refill?

## 2015-07-18 NOTE — Telephone Encounter (Signed)
rx called in

## 2015-07-18 NOTE — Telephone Encounter (Signed)
Approved. #90+ 0. 

## 2015-07-24 ENCOUNTER — Ambulatory Visit: Payer: PPO | Admitting: Pain Medicine

## 2015-07-31 ENCOUNTER — Encounter: Payer: Self-pay | Admitting: Pain Medicine

## 2015-07-31 ENCOUNTER — Ambulatory Visit: Payer: PPO | Attending: Pain Medicine | Admitting: Pain Medicine

## 2015-07-31 VITALS — BP 126/55 | HR 83 | Temp 98.4°F | Resp 16 | Ht 73.0 in | Wt 258.0 lb

## 2015-07-31 DIAGNOSIS — M170 Bilateral primary osteoarthritis of knee: Secondary | ICD-10-CM

## 2015-07-31 DIAGNOSIS — M5136 Other intervertebral disc degeneration, lumbar region: Secondary | ICD-10-CM | POA: Insufficient documentation

## 2015-07-31 DIAGNOSIS — Z981 Arthrodesis status: Secondary | ICD-10-CM

## 2015-07-31 DIAGNOSIS — M545 Low back pain: Secondary | ICD-10-CM | POA: Diagnosis not present

## 2015-07-31 DIAGNOSIS — M4806 Spinal stenosis, lumbar region: Secondary | ICD-10-CM | POA: Diagnosis not present

## 2015-07-31 DIAGNOSIS — M47812 Spondylosis without myelopathy or radiculopathy, cervical region: Secondary | ICD-10-CM

## 2015-07-31 DIAGNOSIS — M47816 Spondylosis without myelopathy or radiculopathy, lumbar region: Secondary | ICD-10-CM

## 2015-07-31 DIAGNOSIS — M48062 Spinal stenosis, lumbar region with neurogenic claudication: Secondary | ICD-10-CM

## 2015-07-31 DIAGNOSIS — M47817 Spondylosis without myelopathy or radiculopathy, lumbosacral region: Secondary | ICD-10-CM | POA: Diagnosis not present

## 2015-07-31 DIAGNOSIS — M533 Sacrococcygeal disorders, not elsewhere classified: Secondary | ICD-10-CM

## 2015-07-31 DIAGNOSIS — M5030 Other cervical disc degeneration, unspecified cervical region: Secondary | ICD-10-CM

## 2015-07-31 DIAGNOSIS — M79606 Pain in leg, unspecified: Secondary | ICD-10-CM | POA: Diagnosis not present

## 2015-07-31 MED ORDER — ORPHENADRINE CITRATE 30 MG/ML IJ SOLN: 60.0000 mg | Freq: Once | INTRAMUSCULAR | Status: AC

## 2015-07-31 MED ORDER — MIDAZOLAM HCL 5 MG/5ML IJ SOLN: 5.0000 mg | Freq: Once | INTRAMUSCULAR | Status: AC

## 2015-07-31 MED ORDER — TRIAMCINOLONE ACETONIDE 40 MG/ML IJ SUSP
INTRAMUSCULAR | Status: AC
  Administered 2015-07-31: 09:00:00
  Filled 2015-07-31: qty 1

## 2015-07-31 MED ORDER — FENTANYL CITRATE (PF) 100 MCG/2ML IJ SOLN: 100.0000 ug | Freq: Once | INTRAMUSCULAR | Status: AC

## 2015-07-31 MED ORDER — CEFUROXIME AXETIL 250 MG PO TABS: 250.0000 mg | ORAL_TABLET | Freq: Two times a day (BID) | ORAL | Status: DC

## 2015-07-31 MED ORDER — BUPIVACAINE HCL (PF) 0.25 % IJ SOLN
INTRAMUSCULAR | Status: AC
  Administered 2015-07-31: 09:00:00
  Filled 2015-07-31: qty 30

## 2015-07-31 MED ORDER — CEFAZOLIN SODIUM 1 G IJ SOLR
INTRAMUSCULAR | Status: AC
  Administered 2015-07-31: 1 g via INTRAVENOUS
  Filled 2015-07-31: qty 10

## 2015-07-31 MED ORDER — TRIAMCINOLONE ACETONIDE 40 MG/ML IJ SUSP: 40.0000 mg | Freq: Once | INTRAMUSCULAR | Status: AC

## 2015-07-31 MED ORDER — BUPIVACAINE HCL (PF) 0.25 % IJ SOLN: 30.0000 mL | Freq: Once | INTRAMUSCULAR | Status: AC

## 2015-07-31 MED ORDER — FENTANYL CITRATE (PF) 100 MCG/2ML IJ SOLN
INTRAMUSCULAR | Status: AC
  Administered 2015-07-31: 100 ug via INTRAVENOUS
  Filled 2015-07-31: qty 2

## 2015-07-31 MED ORDER — ORPHENADRINE CITRATE 30 MG/ML IJ SOLN
INTRAMUSCULAR | Status: AC
  Filled 2015-07-31: qty 2

## 2015-07-31 MED ORDER — LACTATED RINGERS IV SOLN: 1000.0000 mL | INTRAVENOUS | Status: AC

## 2015-07-31 MED ORDER — CEFAZOLIN SODIUM 1-5 GM-% IV SOLN: 1.0000 g | Freq: Once | INTRAVENOUS | Status: AC

## 2015-07-31 MED ORDER — MIDAZOLAM HCL 5 MG/5ML IJ SOLN
INTRAMUSCULAR | Status: AC
  Administered 2015-07-31: 5 mg via INTRAVENOUS
  Filled 2015-07-31: qty 5

## 2015-07-31 MED ORDER — LIDOCAINE HCL (PF) 1 % IJ SOLN: 10.0000 mL | Freq: Once | INTRAMUSCULAR | Status: AC

## 2015-07-31 NOTE — Progress Notes (Signed)
Subjective:    Patient ID: Brent Hamilton, male    DOB: 07-28-1960, 55 y.o.   MRN: [id number]  HPI PROCEDURE PERFORMED: Lumbar facet (medial branch block)   NOTE: The patient is a 55 y.o. male who returns to [facility] for further evaluation and treatment of pain involving the lumbar and lower extremity region. MRI  revealed the patient to be with evidence of Degenerative disc disease lumbar spine  T12-L1, L1 L2 facet hypertrophy L2-3 facet hypertrophy L4-5 anterior listhesis at this level with mild disc bulging and ligamentous hypertrophy with moderate bilateral L4 foraminal stenosis, L5-S1 facet hypertrophy. . There is concern regarding significant component of patient's pain began due to facet arthropathy with facet syndrome The risks, benefits, and expectations of the procedure have been discussed and explained to the patient who was understanding and in agreement with suggested treatment plan. We will proceed with interventional treatment as discussed and as explained to the patient who was understanding and wished to proceed with procedure as planned.   DESCRIPTION OF PROCEDURE: Lumbar facet (medial branch block) with IV Versed, IV fentanyl conscious sedation, EKG, blood pressure, pulse, and pulse oximetry monitoring. The procedure was performed with the patient in the prone position. Betadine prep of proposed entry site performed.   NEEDLE PLACEMENT AT: Left L 2 lumbar facet (medial branch block). Under fluoroscopic guidance with oblique orientation of 15 degrees, a 22-gauge needle was inserted at the L 2 vertebral body level with needle placed at the targeted area of Burton's Eye or Eye of the Scotty Dog with documentation of needle placement in the superior and lateral border of targeted area of Burton's Eye or Eye of the Scotty Dog with oblique orientation of 15 degrees. Following documentation of needle placement at the L 2 vertebral body level, needle placement was then  accomplished at the L 3 vertebral body level.   NEEDLE PLACEMENT AT L3, L4, and L5 VERTEBRAL BODY LEVELS ON THE LEFT SIDE The procedure was performed at the L3, L4, and L5 vertebral body levels exactly as was performed at the L 2 vertebral body level utilizing the same technique and under fluoroscopic guidance.  NEEDLE PLACEMENT AT THE SACRAL ALA with AP view of the lumbosacral spine. With the patient in the prone position, Betadine prep of proposed entry site accomplished, a 22 gauge needle was inserted in the region of the sacral ala (groove formed by the superior articulating process of S1 and the sacral wing). Following documentation of needle placement at the sacral ala,    Needle placement was then verified at all levels on lateral view. Following documentation of needle placement at all levels on lateral view and following negative aspiration for heme and CSF, each level was injected with 1 mL of 0.25% bupivacaine with Kenalog.     LUMBAR FACET, MEDIAL BRANCH NERVE, BLOCKS PERFORMED ON THE RIGHT SIDE   The procedure was performed on the right side exactly as was performed on the left side at the same levels and utilizing the same technique under fluoroscopic guidance.     The patient tolerated the procedure well. A total of 40 mg of Kenalog was utilized for the procedure.   PLAN:  1. Medications: The patient will continue presently prescribed medications. Dilaudid and fentanyl patch   2. May consider modification of treatment regimen at time of return appointment pending response to treatment rendered on today's visit. 3. The patient is to follow-up with primary care physician  Dr. Primitivo Gauze  for further evaluation of blood pressure and general medical condition status post steroid injection performed on today's visit. 4. Surgical follow-up evaluation. Has been addressed  5. Neurological follow-up evaluation. May consider [facility] EMG studies  6. The patient may be candidate for  radiofrequency procedures, implantation type procedures, and other treatment pending response to treatment and follow-up evaluation. 7. The patient has been advised to call the [facility] Management Center prior to scheduled return appointment should there be significant change in condition or should patient have other concerns regarding condition prior to scheduled return appointment.  The patient is understanding and in agreement with suggested treatment plan.    Review of Systems     Objective:   Physical Exam        Assessment & Plan:

## 2015-07-31 NOTE — Patient Instructions (Addendum)
PLAN  Continue present medication Dilaudid and fentanyl patch and begin taking antibiotic Ceftin as prescribed. Please obtain your antibiotic today and begin taking antibiotic today  F/U PCP  for evaliation of  BP and general medical  condition.  F/U surgical evaluation. May consider pending follow-up evaluations  F/U neurological evaluation. May consider pending follow-up evaluations  F/U rheumatological evaluation  May consider radiofrequency rhizolysis or intraspinal procedures pending response to present treatment and F/U evaluation.  Patient to call Pain Management Center should patient have concerns prior to scheduled return appointment. Facet Blocks Patient Information  Description: The facets are joints in the spine between the vertebrae.  Like any joints in the body, facets can become irritated and painful.  Arthritis can also effect the facets.  By injecting steroids and local anesthetic in and around these joints, we can temporarily block the nerve supply to them.  Steroids act directly on irritated nerves and tissues to reduce selling and inflammation which often leads to decreased pain.  Facet blocks may be done anywhere along the spine from the neck to the low back depending upon the location of your pain.   After numbing the skin with local anesthetic (like Novocaine), a small needle is passed onto the facet joints under x-ray guidance.  You may experience a sensation of pressure while this is being done.  The entire block usually lasts about 15-25 minutes.   Conditions which may be treated by facet blocks:   Low back/buttock pain  Neck/shoulder pain  Certain types of headaches  Preparation for the injection:  1. Do not eat any solid food or dairy products within 6 hours of your appointment. 2. You may drink clear liquid up to 2 hours before appointment.  Clear liquids include water, black coffee, juice or soda.  No milk or cream please. 3. You may take your regular  medication, including pain medications, with a sip of water before your appointment.  Diabetics should hold regular insulin (if taken separately) and take 1/2 normal NPH dose the morning of the procedure.  Carry some sugar containing items with you to your appointment. 4. A driver must accompany you and be prepared to drive you home after your procedure. 5. Bring all your current medications with you. 6. An IV may be inserted and sedation may be given at the discretion of the physician. 7. A blood pressure cuff, EKG and other monitors will often be applied during the procedure.  Some patients may need to have extra oxygen administered for a short period. 8. You will be asked to provide medical information, including your allergies and medications, prior to the procedure.  We must know immediately if you are taking blood thinners (like Coumadin/Warfarin) or if you are allergic to IV iodine contrast (dye).  We must know if you could possible be pregnant.  Possible side-effects:   Bleeding from needle site  Infection (rare, may require surgery)  Nerve injury (rare)  Numbness & tingling (temporary)  Difficulty urinating (rare, temporary)  Spinal headache (a headache worse with upright posture)  Light-headedness (temporary)  Pain at injection site (serveral days)  Decreased blood pressure (rare, temporary)  Weakness in arm/leg (temporary)  Pressure sensation in back/neck (temporary)   Call if you experience:   Fever/chills associated with headache or increased back/neck pain  Headache worsened by an upright position  New onset, weakness or numbness of an extremity below the injection site  Hives or difficulty breathing (go to the emergency room)  Inflammation or drainage  at the injection site(s)  Severe back/neck pain greater than usual  New symptoms which are concerning to you  Please note:  Although the local anesthetic injected can often make your back or neck feel  good for several hours after the injection, the pain will likely return. It takes 3-7 days for steroids to work.  You may not notice any pain relief for at least one week.  If effective, we will often do a series of 2-3 injections spaced 3-6 weeks apart to maximally decrease your pain.  After the initial series, you may be a candidate for a more permanent nerve block of the facets.  If you have any questions, please call #[phone]) [facility]  General Discharge Instructions :  If you need to reach your doctor call: Monday-Friday 8:00 am - 4:00 pm at [phone] or toll free [phone].  After clinic hours [phone] to have operator reach doctor.  Bring all of your medication bottles to all your appointments in the pain clinic.  To cancel or reschedule your appointment with Pain Management please remember to call 24 hours in advance to avoid a fee.  Refer to the educational materials which you have been given on: General Risks, I had my Procedure. Discharge Instructions, Post Sedation.  Post Procedure Instructions:  The drugs you were given will stay in your system until tomorrow, so for the next 24 hours you should not drive, make any legal decisions or drink any alcoholic beverages.  You may eat anything you prefer, but it is better to start with liquids then soups and crackers, and gradually work up to solid foods.  Please notify your doctor immediately if you have any unusual bleeding, trouble breathing or pain that is not related to your normal pain.  Depending on the type of procedure that was done, some parts of your body may feel week and/or numb.  This usually clears up by tonight or the next day.  Walk with the use of an assistive device or accompanied by an adult for the 24 hours.  You may use ice on the affected area for the first 24 hours.  Put ice in a Ziploc bag and cover with a towel and  place against area 15 minutes on 15 minutes off.  You may switch to heat after 24 hours.

## 2015-07-31 NOTE — Progress Notes (Signed)
Safety precautions to be maintained throughout the outpatient stay will include: orient to surroundings, keep bed in low position, maintain call bell within reach at all times, provide assistance with transfer out of bed and ambulation.  

## 2015-08-01 ENCOUNTER — Telehealth: Payer: Self-pay | Admitting: *Deleted

## 2015-08-01 NOTE — Telephone Encounter (Signed)
Message left

## 2015-08-07 ENCOUNTER — Ambulatory Visit: Payer: PPO | Attending: Pain Medicine | Admitting: Pain Medicine

## 2015-08-07 ENCOUNTER — Encounter: Payer: Self-pay | Admitting: Pain Medicine

## 2015-08-07 VITALS — BP 151/75 | HR 71 | Temp 98.2°F | Resp 16 | Ht 73.0 in | Wt 264.0 lb

## 2015-08-07 DIAGNOSIS — M533 Sacrococcygeal disorders, not elsewhere classified: Secondary | ICD-10-CM | POA: Diagnosis not present

## 2015-08-07 DIAGNOSIS — M5030 Other cervical disc degeneration, unspecified cervical region: Secondary | ICD-10-CM | POA: Diagnosis not present

## 2015-08-07 DIAGNOSIS — M47817 Spondylosis without myelopathy or radiculopathy, lumbosacral region: Secondary | ICD-10-CM | POA: Diagnosis not present

## 2015-08-07 DIAGNOSIS — M545 Low back pain: Secondary | ICD-10-CM | POA: Diagnosis not present

## 2015-08-07 DIAGNOSIS — M5126 Other intervertebral disc displacement, lumbar region: Secondary | ICD-10-CM | POA: Insufficient documentation

## 2015-08-07 DIAGNOSIS — M5124 Other intervertebral disc displacement, thoracic region: Secondary | ICD-10-CM | POA: Insufficient documentation

## 2015-08-07 DIAGNOSIS — M4806 Spinal stenosis, lumbar region: Secondary | ICD-10-CM | POA: Diagnosis not present

## 2015-08-07 DIAGNOSIS — M5134 Other intervertebral disc degeneration, thoracic region: Secondary | ICD-10-CM | POA: Insufficient documentation

## 2015-08-07 DIAGNOSIS — M5136 Other intervertebral disc degeneration, lumbar region: Secondary | ICD-10-CM | POA: Diagnosis not present

## 2015-08-07 DIAGNOSIS — M170 Bilateral primary osteoarthritis of knee: Secondary | ICD-10-CM

## 2015-08-07 DIAGNOSIS — M48062 Spinal stenosis, lumbar region with neurogenic claudication: Secondary | ICD-10-CM

## 2015-08-07 DIAGNOSIS — Z981 Arthrodesis status: Secondary | ICD-10-CM

## 2015-08-07 DIAGNOSIS — M5416 Radiculopathy, lumbar region: Secondary | ICD-10-CM | POA: Diagnosis not present

## 2015-08-07 DIAGNOSIS — M47812 Spondylosis without myelopathy or radiculopathy, cervical region: Secondary | ICD-10-CM

## 2015-08-07 DIAGNOSIS — M79606 Pain in leg, unspecified: Secondary | ICD-10-CM | POA: Diagnosis not present

## 2015-08-07 DIAGNOSIS — M542 Cervicalgia: Secondary | ICD-10-CM | POA: Diagnosis not present

## 2015-08-07 DIAGNOSIS — M791 Myalgia: Secondary | ICD-10-CM | POA: Diagnosis not present

## 2015-08-07 DIAGNOSIS — M47816 Spondylosis without myelopathy or radiculopathy, lumbar region: Secondary | ICD-10-CM

## 2015-08-07 MED ORDER — HYDROMORPHONE HCL 4 MG PO TABS: ORAL_TABLET | ORAL | Status: DC

## 2015-08-07 MED ORDER — FENTANYL 50 MCG/HR TD PT72: MEDICATED_PATCH | TRANSDERMAL | Status: DC

## 2015-08-07 NOTE — Patient Instructions (Addendum)
PLAN   Continue present medication Dilaudid and fentanyl patch  Block of nerves to the sacroiliac joint to be performed at time return appointment  F/U PCP Dr.Shah   for evaliation of  BP and general medical  condition  F/U surgical evaluation. May consider pending follow-up evaluations  F/U neurological evaluation. May consider pending follow-up evaluations  F/U rheumatological evaluation as needed  May consider radiofrequency rhizolysis or intraspinal procedures pending response to present treatment and F/U evaluation   Patient to call Pain Management Center should patient have concerns prior to scheduled return appointmentPain Management Discharge Instructions  General Discharge Instructions :  If you need to reach your doctor call: Monday-Friday 8:00 am - 4:00 pm at [phone] or toll free [phone].  After clinic hours [phone] to have operator reach doctor.  Bring all of your medication bottles to all your appointments in the pain clinic.  To cancel or reschedule your appointment with Pain Management please remember to call 24 hours in advance to avoid a fee.  Refer to the educational materials which you have been given on: General Risks, I had my Procedure. Discharge Instructions, Post Sedation.  Post Procedure Instructions:  The drugs you were given will stay in your system until tomorrow, so for the next 24 hours you should not drive, make any legal decisions or drink any alcoholic beverages.  You may eat anything you prefer, but it is better to start with liquids then soups and crackers, and gradually work up to solid foods.  Please notify your doctor immediately if you have any unusual bleeding, trouble breathing or pain that is not related to your normal pain.  Depending on the type of procedure that was done, some parts of your body may feel week and/or numb.  This usually clears up by tonight or the next day.  Walk with the use of an assistive device or  accompanied by an adult for the 24 hours.  You may use ice on the affected area for the first 24 hours.  Put ice in a Ziploc bag and cover with a towel and place against area 15 minutes on 15 minutes off.  You may switch to heat after 24 hours.Sacroiliac (SI) Joint Injection Patient Information  Description: The sacroiliac joint connects the scrum (very low back and tailbone) to the ilium (a pelvic bone which also forms half of the hip joint).  Normally this joint experiences very little motion.  When this joint becomes inflamed or unstable low back and or hip and pelvis pain may result.  Injection of this joint with local anesthetics (numbing medicines) and steroids can provide diagnostic information and reduce pain.  This injection is performed with the aid of x-ray guidance into the tailbone area while you are lying on your stomach.   You may experience an electrical sensation down the leg while this is being done.  You may also experience numbness.  We also may ask if we are reproducing your normal pain during the injection.  Conditions which may be treated SI injection:   Low back, buttock, hip or leg pain  Preparation for the Injection:  1. Do not eat any solid food or dairy products within 6 hours of your appointment.  2. You may drink clear liquids up to 2 hours before appointment.  Clear liquids include water, black coffee, juice or soda.  No milk or cream please. 3. You may take your regular medications, including pain medications with a sip of water before your appointment.  Diabetics should hold regular insulin (if take separately) and take 1/2 normal NPH dose the morning of the procedure.  Carry some sugar containing items with you to your appointment. 4. A driver must accompany you and be prepared to drive you home after your procedure. 5. Bring all of your current medications with you. 6. An IV may be inserted and sedation may be given at the discretion of the physician. 7. A  blood pressure cuff, EKG and other monitors will often be applied during the procedure.  Some patients may need to have extra oxygen administered for a short period.  8. You will be asked to provide medical information, including your allergies, prior to the procedure.  We must know immediately if you are taking blood thinners (like Coumadin/Warfarin) or if you are allergic to IV iodine contrast (dye).  We must know if you could possible be pregnant.  Possible side effects:   Bleeding from needle site  Infection (rare, may require surgery)  Nerve injury (rare)  Numbness & tingling (temporary)  A brief convulsion or seizure  Light-headedness (temporary)  Pain at injection site (several days)  Decreased blood pressure (temporary)  Weakness in the leg (temporary)   Call if you experience:   New onset weakness or numbness of an extremity below the injection site that last more than 8 hours.  Hives or difficulty breathing ( go to the emergency room)  Inflammation or drainage at the injection site  Any new symptoms which are concerning to you  Please note:  Although the local anesthetic injected can often make your back/ hip/ buttock/ leg feel good for several hours after the injections, the pain will likely return.  It takes 3-7 days for steroids to work in the sacroiliac area.  You may not notice any pain relief for at least that one week.  If effective, we will often do a series of three injections spaced 3-6 weeks apart to maximally decrease your pain.  After the initial series, we generally will wait some months before a repeat injection of the same type.  If you have any questions, please call [phone] [place]  What are the risk, side effects and possible complications? Generally speaking, most procedures are safe.  However, with any procedure there are risks, side effects, and the possibility  of complications.  The risks and complications are dependent upon the sites that are lesioned, or the type of nerve block to be performed.  The closer the procedure is to the spine, the more serious the risks are.  Great care is taken when placing the radio frequency needles, block needles or lesioning probes, but sometimes complications can occur. 1. Infection: Any time there is an injection through the skin, there is a risk of infection.  This is why sterile conditions are used for these blocks.  There are four possible types of infection. 1. Localized skin infection. 2. Central Nervous System Infection-This can be in the form of Meningitis, which can be deadly. 3. Epidural Infections-This can be in the form of an epidural abscess, which can cause pressure inside of the spine, causing compression of the spinal cord with subsequent paralysis. This would require an emergency surgery to decompress, and there are no guarantees that the patient would recover from the paralysis. 4. Discitis-This is an infection of the intervertebral discs.  It occurs in about 1% of discography procedures.  It is difficult to treat and it may  lead to surgery.        2. Pain: the needles have to go through skin and soft tissues, will cause soreness.       3. Damage to internal structures:  The nerves to be lesioned may be near blood vessels or    other nerves which can be potentially damaged.       4. Bleeding: Bleeding is more common if the patient is taking blood thinners such as  aspirin, Coumadin, Ticiid, Plavix, etc., or if he/she have some genetic predisposition  such as hemophilia. Bleeding into the spinal canal can cause compression of the spinal  cord with subsequent paralysis.  This would require an emergency surgery to  decompress and there are no guarantees that the patient would recover from the  paralysis.       5. Pneumothorax:  Puncturing of a lung is a possibility, every time a needle is introduced in  the area  of the chest or upper back.  Pneumothorax refers to free air around the  collapsed lung(s), inside of the thoracic cavity (chest cavity).  Another two possible  complications related to a similar event would include: Hemothorax and Chylothorax.   These are variations of the Pneumothorax, where instead of air around the collapsed  lung(s), you may have blood or chyle, respectively.       6. Spinal headaches: They may occur with any procedures in the area of the spine.       7. Persistent CSF (Cerebro-Spinal Fluid) leakage: This is a rare problem, but may occur  with prolonged intrathecal or epidural catheters either due to the formation of a fistulous  track or a dural tear.       8. Nerve damage: By working so close to the spinal cord, there is always a possibility of  nerve damage, which could be as serious as a permanent spinal cord injury with  paralysis.       9. Death:  Although rare, severe deadly allergic reactions known as "Anaphylactic  reaction" can occur to any of the medications used.      10. Worsening of the symptoms:  We can always make thing worse.  What are the chances of something like this happening? Chances of any of this occuring are extremely low.  By statistics, you have more of a chance of getting killed in a motor vehicle accident: while driving to the hospital than any of the above occurring .  Nevertheless, you should be aware that they are possibilities.  In general, it is similar to taking a shower.  Everybody knows that you can slip, hit your head and get killed.  Does that mean that you should not shower again?  Nevertheless always keep in mind that statistics do not mean anything if you happen to be on the wrong side of them.  Even if a procedure has a 1 (one) in a 1,000,000 (million) chance of going wrong, it you happen to be that one..Also, keep in mind that by statistics, you have more of a chance of having something go wrong when taking medications.  Who should not have  this procedure? If you are on a blood thinning medication (e.g. Coumadin, Plavix, see list of "Blood Thinners"), or if you have an active infection going on, you should not have the procedure.  If you are taking any blood thinners, please inform your physician.  How should I prepare for this procedure?  Do not eat or drink anything at least  six hours prior to the procedure.  Bring a driver with you .  It cannot be a taxi.  Come accompanied by an adult that can drive you back, and that is strong enough to help you if your legs get weak or numb from the local anesthetic.  Take all of your medicines the morning of the procedure with just enough water to swallow them.  If you have diabetes, make sure that you are scheduled to have your procedure done first thing in the morning, whenever possible.  If you have diabetes, take only half of your insulin dose and notify our nurse that you have done so as soon as you arrive at the clinic.  If you are diabetic, but only take blood sugar pills (oral hypoglycemic), then do not take them on the morning of your procedure.  You may take them after you have had the procedure.  Do not take aspirin or any aspirin-containing medications, at least eleven (11) days prior to the procedure.  They may prolong bleeding.  Wear loose fitting clothing that may be easy to take off and that you would not mind if it got stained with Betadine or blood.  Do not wear any jewelry or perfume  Remove any nail coloring.  It will interfere with some of our monitoring equipment.  NOTE: Remember that this is not meant to be interpreted as a complete list of all possible complications.  Unforeseen problems may occur.  BLOOD THINNERS The following drugs contain aspirin or other products, which can cause increased bleeding during surgery and should not be taken for 2 weeks prior to and 1 week after surgery.  If you should need take something for relief of minor pain, you may take  acetaminophen which is found in Tylenol,m Datril, Anacin-3 and Panadol. It is not blood thinner. The products listed below are.  Do not take any of the products listed below in addition to any listed on your instruction sheet.  A.P.C or A.P.C with Codeine Codeine Phosphate Capsules #3 Ibuprofen Ridaura  ABC compound Congesprin Imuran rimadil  Advil Cope Indocin Robaxisal  Alka-Seltzer Effervescent Pain Reliever and Antacid Coricidin or Coricidin-D  Indomethacin Rufen  Alka-Seltzer plus Cold Medicine Cosprin Ketoprofen S-A-C Tablets  Anacin Analgesic Tablets or Capsules Coumadin Korlgesic Salflex  Anacin Extra Strength Analgesic tablets or capsules CP-2 Tablets Lanoril Salicylate  Anaprox Cuprimine Capsules Levenox Salocol  Anexsia-D Dalteparin Magan Salsalate  Anodynos Darvon compound Magnesium Salicylate Sine-off  Ansaid Dasin Capsules Magsal Sodium Salicylate  Anturane Depen Capsules Marnal Soma  APF Arthritis pain formula Dewitt's Pills Measurin Stanback  Argesic Dia-Gesic Meclofenamic Sulfinpyrazone  Arthritis Bayer Timed Release Aspirin Diclofenac Meclomen Sulindac  Arthritis pain formula Anacin Dicumarol Medipren Supac  Analgesic (Safety coated) Arthralgen Diffunasal Mefanamic Suprofen  Arthritis Strength Bufferin Dihydrocodeine Mepro Compound Suprol  Arthropan liquid Dopirydamole Methcarbomol with Aspirin Synalgos  ASA tablets/Enseals Disalcid Micrainin Tagament  Ascriptin Doan's Midol Talwin  Ascriptin A/D Dolene Mobidin Tanderil  Ascriptin Extra Strength Dolobid Moblgesic Ticlid  Ascriptin with Codeine Doloprin or Doloprin with Codeine Momentum Tolectin  Asperbuf Duoprin Mono-gesic Trendar  Aspergum Duradyne Motrin or Motrin IB Triminicin  Aspirin plain, buffered or enteric coated Durasal Myochrisine Trigesic  Aspirin Suppositories Easprin Nalfon Trillsate  Aspirin with Codeine Ecotrin Regular or Extra Strength Naprosyn Uracel  Atromid-S Efficin Naproxen Ursinus  Auranofin  Capsules Elmiron Neocylate Vanquish  Axotal Emagrin Norgesic Verin  Azathioprine Empirin or Empirin with Codeine Normiflo Vitamin E  Azolid Emprazil Nuprin Voltaren  Bayer Aspirin  plain, buffered or children's or timed BC Tablets or powders Encaprin Orgaran Warfarin Sodium  Buff-a-Comp Enoxaparin Orudis Zorpin  Buff-a-Comp with Codeine Equegesic Os-Cal-Gesic   Buffaprin Excedrin plain, buffered or Extra Strength Oxalid   Bufferin Arthritis Strength Feldene Oxphenbutazone   Bufferin plain or Extra Strength Feldene Capsules Oxycodone with Aspirin   Bufferin with Codeine Fenoprofen Fenoprofen Pabalate or Pabalate-SF   Buffets II Flogesic Panagesic   Buffinol plain or Extra Strength Florinal or Florinal with Codeine Panwarfarin   Buf-Tabs Flurbiprofen Penicillamine   Butalbital Compound Four-way cold tablets Penicillin   Butazolidin Fragmin Pepto-Bismol   Carbenicillin Geminisyn Percodan   Carna Arthritis Reliever Geopen Persantine   Carprofen Gold's salt Persistin   Chloramphenicol Goody's Phenylbutazone   Chloromycetin Haltrain Piroxlcam   Clmetidine heparin Plaquenil   Cllnoril Hyco-pap Ponstel   Clofibrate Hydroxy chloroquine Propoxyphen         Before stopping any of these medications, be sure to consult the physician who ordered them.  Some, such as Coumadin (Warfarin) are ordered to prevent or treat serious conditions such as "deep thrombosis", "pumonary embolisms", and other heart problems.  The amount of time that you may need off of the medication may also vary with the medication and the reason for which you were taking it.  If you are taking any of these medications, please make sure you notify your pain physician before you undergo any procedures.

## 2015-08-07 NOTE — Progress Notes (Signed)
Subjective:    Patient ID: Brent Hamilton, male    DOB: 06-Dec-1960, 55 y.o.   MRN: [id number]  HPI  The patient is a 55 year old gentleman who returns to pain management for further evaluation and treatment of pain involving the region of the lower back and lower extremity region with pain in the neck upper extremity region of lesser degree. Patient states the pain radiation the lumbar region across the buttocks on the left as well as on the right and is aggravated by standing walking. The patient states pain becomes more intense as the day progresses. The patient denies any trauma change in events of daily living the call significant change in symptoms tolerated. The patient continues medication without undesirable side effects. We will consider patient for block of nerves to the sacroiliac joint to be performed at time return appointment in attempt to decrease the patient's symptoms, minimize progression of symptoms, and avoid the need for more involved treatment. The patient was with understanding and in agreement with suggested treatment plan  Review of Systems     Objective:   Physical Exam  There was mild tenderness of the splenius capitis and occipitalis musculature region palpation which be produced pain of mild degree with mild tenderness over the region of the cervical facet cervical paraspinal must reason on the left as well as on the right. Palpation over the thoracic facet thoracic paraspinal musculature reproduces mild discomfort as well there was tenderness of the right elbow medial and lateral epicondyles with questionably decreased grip strength on the right compared to the left. Tinel and Phalen's maneuver associated with mild to moderate discomfort. There was tenderness over the cervical facet and thoracic facet region with no crepitus of the thoracic region noted. Palpation over the lumbar paraspinal muscles lumbar facet region was attends to palpation with lateral bending  rotation extension and palpation of the lumbar facets reproducing moderate discomfort was severe tenderness over the PSIS and PII S regions as well as the gluteal and piriformis muscular region. Mild tenderness of the greater trochanteric region and iliotibial band region. Straight leg raise was tolerates approximately 20 without increased pain with dorsiflexion noted. There was negative clonus negative Homans. Predominant portion of patient's pain was reproduced with palpation over the PSIS and PII S region. No sensory deficit or dermatomal dystrophy detected. There was negative clonus negative Homans. Abdomen nontender with no costovertebral tenderness noted      Assessment & Plan:    Sacroiliac joint dysfunction  Degenerative disc disease lumbar spine  T12-L1, L1 L2 facet hypertrophy L2-3 facet hypertrophy L4-5 anterior listhesis at this level with mild disc bulging and ligamentous hypertrophy with moderate bilateral L4 foraminal stenosis, L5-S1 facet hypertrophy  Lumbar facet syndrome  Lumbar stenosis with neurogenic claudication  Degenerative disc disease thoracic spine T11-12 circumferential disc bulge T10-11 partially visible degenerative changes of the thoracic  Degenerative disc disease cervical spine      PLAN   Continue present medication Dilaudid and fentanyl patch  Block of nerves to the sacroiliac joint to be performed at time return appointment  F/U PCP Dr.Shah   for evaliation of  BP and general medical  condition  F/U surgical evaluation. May consider pending follow-up evaluations  F/U neurological evaluation. May consider pending follow-up evaluations  F/U rheumatological evaluation as needed  May consider radiofrequency rhizolysis or intraspinal procedures pending response to present treatment and F/U evaluation   Patient to call Pain Management Center should patient have concerns prior to scheduled return  appointment

## 2015-08-07 NOTE — Progress Notes (Signed)
Safety precautions to be maintained throughout the outpatient stay will include: orient to surroundings, keep bed in low position, maintain call bell within reach at all times, provide assistance with transfer out of bed and ambulation.  

## 2015-08-23 ENCOUNTER — Ambulatory Visit: Payer: Commercial Managed Care - HMO | Admitting: Physician Assistant

## 2015-08-24 ENCOUNTER — Ambulatory Visit (INDEPENDENT_AMBULATORY_CARE_PROVIDER_SITE_OTHER): Payer: PPO | Admitting: Physician Assistant

## 2015-08-24 ENCOUNTER — Encounter: Payer: Self-pay | Admitting: Physician Assistant

## 2015-08-24 VITALS — BP 122/86 | HR 80 | Temp 97.9°F | Resp 18 | Wt 272.0 lb

## 2015-08-24 DIAGNOSIS — F329 Major depressive disorder, single episode, unspecified: Secondary | ICD-10-CM | POA: Diagnosis not present

## 2015-08-24 DIAGNOSIS — F419 Anxiety disorder, unspecified: Secondary | ICD-10-CM | POA: Diagnosis not present

## 2015-08-24 DIAGNOSIS — F32A Depression, unspecified: Secondary | ICD-10-CM

## 2015-08-24 MED ORDER — ALPRAZOLAM 0.5 MG PO TABS: 0.5000 mg | ORAL_TABLET | Freq: Two times a day (BID) | ORAL | Status: DC | PRN

## 2015-08-24 MED ORDER — BUPROPION HCL ER (XL) 300 MG PO TB24: 300.0000 mg | ORAL_TABLET | Freq: Every day | ORAL | Status: DC

## 2015-08-24 NOTE — Progress Notes (Signed)
Patient ID: Brent Hamilton MRN: [id number], DOB: 02-Aug-1960, 55 y.o. Date of Encounter: 08/24/2015, 1:57 PM    Chief Complaint:  Chief Complaint  Patient presents with  . discuss medications    needs refill xanax and wellbutrin     HPI: 55 y.o. year old white male here for above.   THE FOLLOWING IS COPIED FROM OV NOTE 06/19/2015: He has only had one OV with me prior to today--9/2/12016---I reviewed that note today. See that note regarding history regarding anxiety/panic and use of Xanax.   Today he states that 1 week prior to Thanksgiving, he "was canned" regarding his job. 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  Wasn't wanting to do anything--not even go with family out to eat.  Says that 2 years ago, he felt similar when his dad passed away. At that time he was prescried Wellbutrin. That worked well for him. Says he found his old bottle and started that. Is noticing improvement in symptoms already. Is getting up in mornings, etc.   Also, needs refill on Xanax. Last Rx was 05/04/15 for # 90 + 0. Says he uses this as sparingly as he can, but recently has been having to take one every day and one every night to sleep.   ASSESSMENT/PLAN AT THAT OV: 1. Depression He is to take Wellbutrin as directed. If feels worse or develops adverse effects, call us immediately. Otherwise, will plan f/u OV 2 months.  - buPROPion (WELLBUTRIN XL) 150 MG 24 hr tablet; Take 1 tablet (150 mg total) by mouth daily.  Dispense: 30 tablet; Refill: 3  2. Anxiety He knows to use this as sparingly as possible.  - ALPRAZolam (XANAX) 0.5 MG tablet; Take 1 tablet (0.5 mg total) by mouth 3 (three) times daily as needed for  anxiety.  Dispense: 90 tablet; Refill: 1    TODAY--08/24/2015: Today he reports that he is feeling better since he has been on the Wellbutrin. 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 Says he has actually gotten some job offers but they are in [place] and [place] and he does not feel that a move like that would be good for him right now. Says that he definitely is feeling better but says that he still is not feeling "good." Says when he came to the last visit he was staying in bed until 2 PM.   Says that he "was in a bad place last time I came in." Regarding his Xanax use, he says he was doing 2 per day then 3 per day and over the last couple weeks has been limiting to 2 per day. Says that he knows these can be addicting and knows that he needs to limit these. He is having no adverse effects with the Wellbutrin.  Home Meds:   Outpatient Prescriptions Prior to Visit  Medication Sig Dispense Refill  . cholecalciferol (VITAMIN D) 1000 UNITS tablet Take 1,000 Units by mouth.    . fentaNYL (DURAGESIC - DOSED MCG/HR) 50 MCG/HR Apply 1 patch to skin every other day if tolerated 15 patch 0  . FIBER PO Take 1 tablet by mouth  2 (two) times daily.    Marland Kitchen HYDROmorphone (DILAUDID) 4 MG tablet Limit one half to one tablet by mouth per day or twice per day if tolerated if tolerated for breakthrough pain while wearing fentanyl patch 60 tablet 0  . lisinopril (PRINIVIL,ZESTRIL) 40 MG tablet Take 1 tablet (40 mg total) by mouth daily. 90 tablet 1  . testosterone cypionate (DEPOTESTOSTERONE CYPIONATE) 200 MG/ML injection Inject 1 mL (200 mg total) into the muscle every 14 (fourteen) days. 10 mL 1  . ALPRAZolam (XANAX) 0.5 MG tablet TAKE (1) TABLET BY MOUTH THREE TIMES DAILY AS NEEDED FOR ANXIETY. 90 tablet 0  . buPROPion (WELLBUTRIN XL) 150 MG 24 hr tablet Take 1 tablet (150 mg total) by mouth daily. 30 tablet 3  . cefUROXime (CEFTIN) 250 MG tablet Take 1 tablet (250 mg total) by  mouth 2 (two) times daily with a meal. (Patient not taking: Reported on 08/07/2015) 14 tablet 0   Facility-Administered Medications Prior to Visit  Medication Dose Route Frequency Provider Last Rate Last Dose  . bupivacaine (PF) (MARCAINE) 0.25 % injection 30 mL  30 mL Other Once Mohammed Kindle, MD      . ceFAZolin (ANCEF) IVPB 1 g/50 mL premix  1 g Intravenous Once Mohammed Kindle, MD      . fentaNYL (SUBLIMAZE) injection 100 mcg  100 mcg Intravenous Once Mohammed Kindle, MD      . lactated ringers infusion 1,000 mL  1,000 mL Intravenous Continuous Mohammed Kindle, MD      . lactated ringers infusion 1,000 mL  1,000 mL Intravenous Continuous Mohammed Kindle, MD      . lidocaine (PF) (XYLOCAINE) 1 % injection 10 mL  10 mL Subcutaneous Once Mohammed Kindle, MD      . midazolam (VERSED) 5 MG/5ML injection 5 mg  5 mg Intravenous Once Mohammed Kindle, MD      . orphenadrine (NORFLEX) injection 60 mg  60 mg Intramuscular Once Mohammed Kindle, MD      . triamcinolone acetonide (KENALOG-40) injection 40 mg  40 mg Other Once Mohammed Kindle, MD        Allergies:  Allergies  Allergen Reactions  . Flonase [Fluticasone] Swelling, Anaphylaxis and Rash  . Oxycodone Swelling  . Morphine And Related Itching and Rash    Rash developed after about 10 days of taking the tablets      Review of Systems: See HPI for pertinent ROS. All other ROS negative.    Physical Exam: Blood pressure 122/86, pulse 80, temperature 97.9 F (36.6 C), temperature source Oral, resp. rate 18, weight 272 lb (123.378 kg)., Body mass index is 35.89 kg/(m^2). General:  WNWD WM. Appears in no acute distress. Neck: Supple. No thyromegaly. No lymphadenopathy. Lungs: Clear bilaterally to auscultation without wheezes, rales, or rhonchi. Breathing is unlabored. Heart: Regular rhythm. No murmurs, rubs, or gallops. Msk:  Strength and tone normal for age. Extremities/Skin: Warm and dry. Neuro: Alert and oriented X 3. Moves all extremities  spontaneously. Gait is normal. CNII-XII grossly in tact. Psych:  Responds to questions appropriately with a normal affect.     ASSESSMENT AND PLAN:  55 y.o. year old male with   1. Depression Will increase the dose of Wellbutrin to 300 mg daily.  Plan for him to follow-up with office visit and around 7 weeks.  Told him to follow-up sooner if the Wellbutrin causes any adverse effects.  Also will decrease his Xanax to 60 per month.  - buPROPion (WELLBUTRIN XL) 300 MG 24 hr  tablet; Take 1 tablet (300 mg total) by mouth daily.  Dispense: 30 tablet; Refill: 1 - ALPRAZolam (XANAX) 0.5 MG tablet; Take 1 tablet (0.5 mg total) by mouth 2 (two) times daily as needed for anxiety.  Dispense: 60 tablet; Refill: 1  2. Anxiety Will increase the dose of Wellbutrin to 300 mg daily.  Plan for him to follow-up with office visit and around 7 weeks.  Told him to follow-up sooner if the Wellbutrin causes any adverse effects.  Also will decrease his Xanax to 60 per month.  - buPROPion (WELLBUTRIN XL) 300 MG 24 hr tablet; Take 1 tablet (300 mg total) by mouth daily.  Dispense: 30 tablet; Refill: 1 - ALPRAZolam (XANAX) 0.5 MG tablet; Take 1 tablet (0.5 mg total) by mouth 2 (two) times daily as needed for anxiety.  Dispense: 60 tablet; Refill: 1   Signed, [address] [place], [place], [facility] 08/24/2015 1:57 PM

## 2015-09-04 ENCOUNTER — Ambulatory Visit: Payer: PPO | Attending: Pain Medicine | Admitting: Pain Medicine

## 2015-09-04 ENCOUNTER — Encounter: Payer: Self-pay | Admitting: Pain Medicine

## 2015-09-04 VITALS — BP 137/78 | HR 80 | Temp 98.4°F | Resp 16 | Ht 73.0 in | Wt 264.0 lb

## 2015-09-04 DIAGNOSIS — M79606 Pain in leg, unspecified: Secondary | ICD-10-CM | POA: Diagnosis not present

## 2015-09-04 DIAGNOSIS — M5126 Other intervertebral disc displacement, lumbar region: Secondary | ICD-10-CM | POA: Diagnosis not present

## 2015-09-04 DIAGNOSIS — M170 Bilateral primary osteoarthritis of knee: Secondary | ICD-10-CM

## 2015-09-04 DIAGNOSIS — M533 Sacrococcygeal disorders, not elsewhere classified: Secondary | ICD-10-CM

## 2015-09-04 DIAGNOSIS — M51369 Other intervertebral disc degeneration, lumbar region without mention of lumbar back pain or lower extremity pain: Secondary | ICD-10-CM

## 2015-09-04 DIAGNOSIS — M4316 Spondylolisthesis, lumbar region: Secondary | ICD-10-CM | POA: Insufficient documentation

## 2015-09-04 DIAGNOSIS — Z79899 Other long term (current) drug therapy: Secondary | ICD-10-CM | POA: Diagnosis not present

## 2015-09-04 DIAGNOSIS — M5136 Other intervertebral disc degeneration, lumbar region: Secondary | ICD-10-CM | POA: Diagnosis not present

## 2015-09-04 DIAGNOSIS — M545 Low back pain: Secondary | ICD-10-CM | POA: Diagnosis not present

## 2015-09-04 DIAGNOSIS — M47812 Spondylosis without myelopathy or radiculopathy, cervical region: Secondary | ICD-10-CM

## 2015-09-04 DIAGNOSIS — M47816 Spondylosis without myelopathy or radiculopathy, lumbar region: Secondary | ICD-10-CM

## 2015-09-04 DIAGNOSIS — M48062 Spinal stenosis, lumbar region with neurogenic claudication: Secondary | ICD-10-CM

## 2015-09-04 DIAGNOSIS — M4806 Spinal stenosis, lumbar region: Secondary | ICD-10-CM | POA: Insufficient documentation

## 2015-09-04 DIAGNOSIS — Z5181 Encounter for therapeutic drug level monitoring: Secondary | ICD-10-CM | POA: Diagnosis not present

## 2015-09-04 DIAGNOSIS — M5030 Other cervical disc degeneration, unspecified cervical region: Secondary | ICD-10-CM

## 2015-09-04 DIAGNOSIS — M47817 Spondylosis without myelopathy or radiculopathy, lumbosacral region: Secondary | ICD-10-CM | POA: Diagnosis not present

## 2015-09-04 DIAGNOSIS — F1190 Opioid use, unspecified, uncomplicated: Secondary | ICD-10-CM | POA: Diagnosis not present

## 2015-09-04 DIAGNOSIS — G8929 Other chronic pain: Secondary | ICD-10-CM | POA: Diagnosis not present

## 2015-09-04 DIAGNOSIS — Z981 Arthrodesis status: Secondary | ICD-10-CM

## 2015-09-04 DIAGNOSIS — M791 Myalgia: Secondary | ICD-10-CM | POA: Diagnosis not present

## 2015-09-04 MED ORDER — HYDROMORPHONE HCL 4 MG PO TABS: ORAL_TABLET | ORAL | Status: DC

## 2015-09-04 MED ORDER — FENTANYL 50 MCG/HR TD PT72: MEDICATED_PATCH | TRANSDERMAL | Status: DC

## 2015-09-04 MED ORDER — CEFAZOLIN SODIUM 1 G IJ SOLR
INTRAMUSCULAR | Status: AC
  Administered 2015-09-04: 1 g via INTRAVENOUS
  Filled 2015-09-04: qty 10

## 2015-09-04 MED ORDER — TRIAMCINOLONE ACETONIDE 40 MG/ML IJ SUSP
40.0000 mg | Freq: Once | INTRAMUSCULAR | Status: AC
  Administered 2015-09-04: 40 mg

## 2015-09-04 MED ORDER — BUPIVACAINE HCL (PF) 0.25 % IJ SOLN
30.0000 mL | Freq: Once | INTRAMUSCULAR | Status: AC
  Administered 2015-09-04: 30 mL

## 2015-09-04 MED ORDER — CEFUROXIME AXETIL 250 MG PO TABS: 250.0000 mg | ORAL_TABLET | Freq: Two times a day (BID) | ORAL | Status: DC

## 2015-09-04 MED ORDER — LACTATED RINGERS IV SOLN: 1000.0000 mL | INTRAVENOUS | Status: AC

## 2015-09-04 MED ORDER — TRIAMCINOLONE ACETONIDE 40 MG/ML IJ SUSP
INTRAMUSCULAR | Status: AC
  Administered 2015-09-04: 08:00:00
  Filled 2015-09-04: qty 1

## 2015-09-04 MED ORDER — MIDAZOLAM HCL 5 MG/5ML IJ SOLN
INTRAMUSCULAR | Status: AC
  Administered 2015-09-04: 5 mg via INTRAVENOUS
  Filled 2015-09-04: qty 5

## 2015-09-04 MED ORDER — BUPIVACAINE HCL (PF) 0.25 % IJ SOLN
INTRAMUSCULAR | Status: AC
  Administered 2015-09-04: 08:00:00
  Filled 2015-09-04: qty 30

## 2015-09-04 MED ORDER — ORPHENADRINE CITRATE 30 MG/ML IJ SOLN: 60.0000 mg | Freq: Once | INTRAMUSCULAR | Status: AC

## 2015-09-04 MED ORDER — ORPHENADRINE CITRATE 30 MG/ML IJ SOLN
INTRAMUSCULAR | Status: AC
  Administered 2015-09-04: 08:00:00
  Filled 2015-09-04: qty 2

## 2015-09-04 MED ORDER — CEFAZOLIN SODIUM 1-5 GM-% IV SOLN
1.0000 g | Freq: Once | INTRAVENOUS | Status: AC
Start: 2015-09-04 — End: ?

## 2015-09-04 MED ORDER — FENTANYL CITRATE (PF) 100 MCG/2ML IJ SOLN
INTRAMUSCULAR | Status: AC
  Administered 2015-09-04: 100 ug via INTRAVENOUS
  Filled 2015-09-04: qty 2

## 2015-09-04 MED ORDER — MIDAZOLAM HCL 5 MG/5ML IJ SOLN: 5.0000 mg | Freq: Once | INTRAMUSCULAR | Status: AC

## 2015-09-04 NOTE — Patient Instructions (Addendum)
PLAN  Continue present medication Dilaudid and fentanyl patch and begin taking antibiotic Ceftin as prescribed. Please obtain your antibiotic today and begin taking antibiotic today  F/U PCP  for evaliation of  BP and general medical  condition.  F/U surgical evaluation. May consider pending follow-up evaluations  F/U neurological evaluation. May consider pending follow-up evaluations  F/U rheumatological evaluation as discussed  May consider radiofrequency rhizolysis or intraspinal procedures pending response to present treatment and F/U evaluation.  Patient to call [facility] should patient have concerns prior to scheduled return appointmentPain Management Discharge Instructions  General Discharge Instructions :  If you need to reach your doctor call: Monday-Friday 8:00 am - 4:00 pm at [phone] or toll free [phone].  After clinic hours [phone] to have operator reach doctor.  Bring all of your medication bottles to all your appointments in the pain clinic.  To cancel or reschedule your appointment with Pain Management please remember to call 24 hours in advance to avoid a fee.  Refer to the educational materials which you have been given on: General Risks, I had my Procedure. Discharge Instructions, Post Sedation.  Post Procedure Instructions:  The drugs you were given will stay in your system until tomorrow, so for the next 24 hours you should not drive, make any legal decisions or drink any alcoholic beverages.  You may eat anything you prefer, but it is better to start with liquids then soups and crackers, and gradually work up to solid foods.  Please notify your doctor immediately if you have any unusual bleeding, trouble breathing or pain that is not related to your normal pain.  Depending on the type of procedure that was done, some parts of your body may feel week and/or numb.  This usually clears up by tonight or the next day.  Walk with the  use of an assistive device or accompanied by an adult for the 24 hours.  You may use ice on the affected area for the first 24 hours.  Put ice in a Ziploc bag and cover with a towel and place against area 15 minutes on 15 minutes off.  You may switch to heat after 24 hours.GENERAL RISKS AND COMPLICATIONS  What are the risk, side effects and possible complications? Generally speaking, most procedures are safe.  However, with any procedure there are risks, side effects, and the possibility of complications.  The risks and complications are dependent upon the sites that are lesioned, or the type of nerve block to be performed.  The closer the procedure is to the spine, the more serious the risks are.  Great care is taken when placing the radio frequency needles, block needles or lesioning probes, but sometimes complications can occur. 1. Infection: Any time there is an injection through the skin, there is a risk of infection.  This is why sterile conditions are used for these blocks.  There are four possible types of infection. 1. Localized skin infection. 2. Central Nervous System Infection-This can be in the form of Meningitis, which can be deadly. 3. Epidural Infections-This can be in the form of an epidural abscess, which can cause pressure inside of the spine, causing compression of the spinal cord with subsequent paralysis. This would require an emergency surgery to decompress, and there are no guarantees that the patient would recover from the paralysis. 4. Discitis-This is an infection of the intervertebral discs.  It occurs in about 1% of discography procedures.  It is difficult to treat and it may  lead to surgery.        2. Pain: the needles have to go through skin and soft tissues, will cause soreness.       3. Damage to internal structures:  The nerves to be lesioned may be near blood vessels or    other nerves which can be potentially damaged.       4. Bleeding: Bleeding is more common if  the patient is taking blood thinners such as  aspirin, Coumadin, Ticiid, Plavix, etc., or if he/she have some genetic predisposition  such as hemophilia. Bleeding into the spinal canal can cause compression of the spinal  cord with subsequent paralysis.  This would require an emergency surgery to  decompress and there are no guarantees that the patient would recover from the  paralysis.       5. Pneumothorax:  Puncturing of a lung is a possibility, every time a needle is introduced in  the area of the chest or upper back.  Pneumothorax refers to free air around the  collapsed lung(s), inside of the thoracic cavity (chest cavity).  Another two possible  complications related to a similar event would include: Hemothorax and Chylothorax.   These are variations of the Pneumothorax, where instead of air around the collapsed  lung(s), you may have blood or chyle, respectively.       6. Spinal headaches: They may occur with any procedures in the area of the spine.       7. Persistent CSF (Cerebro-Spinal Fluid) leakage: This is a rare problem, but may occur  with prolonged intrathecal or epidural catheters either due to the formation of a fistulous  track or a dural tear.       8. Nerve damage: By working so close to the spinal cord, there is always a possibility of  nerve damage, which could be as serious as a permanent spinal cord injury with  paralysis.       9. Death:  Although rare, severe deadly allergic reactions known as "Anaphylactic  reaction" can occur to any of the medications used.      10. Worsening of the symptoms:  We can always make thing worse.  What are the chances of something like this happening? Chances of any of this occuring are extremely low.  By statistics, you have more of a chance of getting killed in a motor vehicle accident: while driving to the hospital than any of the above occurring .  Nevertheless, you should be aware that they are possibilities.  In general, it is similar to  taking a shower.  Everybody knows that you can slip, hit your head and get killed.  Does that mean that you should not shower again?  Nevertheless always keep in mind that statistics do not mean anything if you happen to be on the wrong side of them.  Even if a procedure has a 1 (one) in a 1,000,000 (million) chance of going wrong, it you happen to be that one..Also, keep in mind that by statistics, you have more of a chance of having something go wrong when taking medications.  Who should not have this procedure? If you are on a blood thinning medication (e.g. Coumadin, Plavix, see list of "Blood Thinners"), or if you have an active infection going on, you should not have the procedure.  If you are taking any blood thinners, please inform your physician.  How should I prepare for this procedure?  Do not eat or drink anything at least  six hours prior to the procedure.  Bring a driver with you .  It cannot be a taxi.  Come accompanied by an adult that can drive you back, and that is strong enough to help you if your legs get weak or numb from the local anesthetic.  Take all of your medicines the morning of the procedure with just enough water to swallow them.  If you have diabetes, make sure that you are scheduled to have your procedure done first thing in the morning, whenever possible.  If you have diabetes, take only half of your insulin dose and notify our nurse that you have done so as soon as you arrive at the clinic.  If you are diabetic, but only take blood sugar pills (oral hypoglycemic), then do not take them on the morning of your procedure.  You may take them after you have had the procedure.  Do not take aspirin or any aspirin-containing medications, at least eleven (11) days prior to the procedure.  They may prolong bleeding.  Wear loose fitting clothing that may be easy to take off and that you would not mind if it got stained with Betadine or blood.  Do not wear any jewelry or  perfume  Remove any nail coloring.  It will interfere with some of our monitoring equipment.  NOTE: Remember that this is not meant to be interpreted as a complete list of all possible complications.  Unforeseen problems may occur.  BLOOD THINNERS The following drugs contain aspirin or other products, which can cause increased bleeding during surgery and should not be taken for 2 weeks prior to and 1 week after surgery.  If you should need take something for relief of minor pain, you may take acetaminophen which is found in Tylenol,m Datril, Anacin-3 and Panadol. It is not blood thinner. The products listed below are.  Do not take any of the products listed below in addition to any listed on your instruction sheet.  A.P.C or A.P.C with Codeine Codeine Phosphate Capsules #3 Ibuprofen Ridaura  ABC compound Congesprin Imuran rimadil  Advil Cope Indocin Robaxisal  Alka-Seltzer Effervescent Pain Reliever and Antacid Coricidin or Coricidin-D  Indomethacin Rufen  Alka-Seltzer plus Cold Medicine Cosprin Ketoprofen S-A-C Tablets  Anacin Analgesic Tablets or Capsules Coumadin Korlgesic Salflex  Anacin Extra Strength Analgesic tablets or capsules CP-2 Tablets Lanoril Salicylate  Anaprox Cuprimine Capsules Levenox Salocol  Anexsia-D Dalteparin Magan Salsalate  Anodynos Darvon compound Magnesium Salicylate Sine-off  Ansaid Dasin Capsules Magsal Sodium Salicylate  Anturane Depen Capsules Marnal Soma  APF Arthritis pain formula Dewitt's Pills Measurin Stanback  Argesic Dia-Gesic Meclofenamic Sulfinpyrazone  Arthritis Bayer Timed Release Aspirin Diclofenac Meclomen Sulindac  Arthritis pain formula Anacin Dicumarol Medipren Supac  Analgesic (Safety coated) Arthralgen Diffunasal Mefanamic Suprofen  Arthritis Strength Bufferin Dihydrocodeine Mepro Compound Suprol  Arthropan liquid Dopirydamole Methcarbomol with Aspirin Synalgos  ASA tablets/Enseals Disalcid Micrainin Tagament  Ascriptin Doan's Midol  Talwin  Ascriptin A/D Dolene Mobidin Tanderil  Ascriptin Extra Strength Dolobid Moblgesic Ticlid  Ascriptin with Codeine Doloprin or Doloprin with Codeine Momentum Tolectin  Asperbuf Duoprin Mono-gesic Trendar  Aspergum Duradyne Motrin or Motrin IB Triminicin  Aspirin plain, buffered or enteric coated Durasal Myochrisine Trigesic  Aspirin Suppositories Easprin Nalfon Trillsate  Aspirin with Codeine Ecotrin Regular or Extra Strength Naprosyn Uracel  Atromid-S Efficin Naproxen Ursinus  Auranofin Capsules Elmiron Neocylate Vanquish  Axotal Emagrin Norgesic Verin  Azathioprine Empirin or Empirin with Codeine Normiflo Vitamin E  Azolid Emprazil Nuprin Voltaren  Bayer Aspirin  plain, buffered or children's or timed BC Tablets or powders Encaprin Orgaran Warfarin Sodium  Buff-a-Comp Enoxaparin Orudis Zorpin  Buff-a-Comp with Codeine Equegesic Os-Cal-Gesic   Buffaprin Excedrin plain, buffered or Extra Strength Oxalid   Bufferin Arthritis Strength Feldene Oxphenbutazone   Bufferin plain or Extra Strength Feldene Capsules Oxycodone with Aspirin   Bufferin with Codeine Fenoprofen Fenoprofen Pabalate or Pabalate-SF   Buffets II Flogesic Panagesic   Buffinol plain or Extra Strength Florinal or Florinal with Codeine Panwarfarin   Buf-Tabs Flurbiprofen Penicillamine   Butalbital Compound Four-way cold tablets Penicillin   Butazolidin Fragmin Pepto-Bismol   Carbenicillin Geminisyn Percodan   Carna Arthritis Reliever Geopen Persantine   Carprofen Gold's salt Persistin   Chloramphenicol Goody's Phenylbutazone   Chloromycetin Haltrain Piroxlcam   Clmetidine heparin Plaquenil   Cllnoril Hyco-pap Ponstel   Clofibrate Hydroxy chloroquine Propoxyphen         Before stopping any of these medications, be sure to consult the physician who ordered them.  Some, such as Coumadin (Warfarin) are ordered to prevent or treat serious conditions such as "deep thrombosis", "pumonary embolisms", and other heart  problems.  The amount of time that you may need off of the medication may also vary with the medication and the reason for which you were taking it.  If you are taking any of these medications, please make sure you notify your pain physician before you undergo any procedures.   Prescription given for pain Fentanyl and Duragesic to last until 10-04-2015.

## 2015-09-04 NOTE — Progress Notes (Signed)
Subjective:    Patient ID: Brent Hamilton, male    DOB: May 16, 1961, 55 y.o.   MRN: [id number]  HPI  PROCEDURE:  Block of nerves to the sacroiliac joint.   NOTE:  The patient is a 55 y.o. male who returns to the [facility] for further evaluation and treatment of pain involving the lower back and lower extremity region with pain in the region of the buttocks as well. Prior MRI studies reveal degenerative disc disease lumbar spine T12-L1, L1 L2 facet hypertrophy L2-3 facet hypertrophy L4-5 anterior listhesis at this level with mild disc bulging and ligamentous hypertrophy with moderate bilateral L4 foraminal stenosis, L5-S1 facet hypertrophy.  the patient is with reproduction of severe pain with palpation over the PSIS and PII S regions and is with positive Patrick's maneuver There is concern regarding a significant component of the patient's pain being due to sacroiliac joint dysfunction The risks, benefits, expectations of the procedure have been discussed and explained to the patient who is understanding and willing to proceed with interventional treatment in attempt to decrease severity of patient's symptoms, minimize the risk of medication escalation and  hopefully retard the progression of the patient's symptoms. We will proceed with what is felt to be a medically necessary procedure, block of nerves to the sacroiliac joint.   DESCRIPTION OF PROCEDURE:  Block of nerves to the sacroiliac joint.   The patient was taken to the fluoroscopy suite. With the patient in the prone position with EKG, blood pressure, pulse and pulse oximetry monitoring, IV Versed, IV fentanyl conscious sedation, Betadine prep of proposed entry site was performed.   Block of nerves at the L5 vertebral body level.   With the patient in prone position, under fluoroscopic guidance, a 22 -gauge needle was inserted at the L5 vertebral body level on the left side. With 15 degrees oblique orientation a 22 -gauge needle  was inserted in the region known as Burton's eye or eye of the Scotty dog. Following documentation of needle placement in the area of Burton's eye or eye of the Scotty dog under fluoroscopic guidance, needle placement was then accomplished at the sacral ala level on the left side.   Needle placement at the sacral ala.   With the patient in prone position under fluoroscopic guidance with AP view of the lumbosacral spine, a 22 -gauge needle was inserted in the region known as the sacral ala on the left side. Following documentation of needle placement on the left side under fluoroscopic guidance needle placement was then accomplished at the S1 foramen level.   Needle placement at the S1 foramen level.   With the patient in prone position under fluoroscopic guidance with AP view of the lumbosacral spine and cephalad orientation, a 22 -gauge needle was inserted at the superior and lateral border of the S1 foramen on the left side. Following documentation of needle placement at the S1 foramen level on the left side, needle placement was then accomplished at the S2 foramen level on the left side.   Needle placement at the S2 foramen level.   With the patient in prone position with AP view of the lumbosacral spine with cephalad orientation, a 22 - gauge needle was inserted at the superior and lateral border of the S2 foramen under fluoroscopic guidance on the left side. Following needle placement at the L5 vertebral body level, sacral ala, S1 foramen and S2 foramen on the left side, needle placement was verified on lateral view under fluoroscopic  guidance.  Following needle placement documentation on lateral view, each needle was injected with 1 mL of 0.25% bupivacaine and Kenalog.   BLOCK OF THE NERVES TO SACROILIAC JOINT ON THE RIGHT SIDE The procedure was performed on the right side at the same levels as was performed on the left side and utilizing the same technique as on the left side and was performed  under fluoroscopic guidance as on the left side   A total of 10mg  of Kenalog was utilized for the procedure.   PLAN:  1. Medications: The patient will continue presently prescribed medications  Dilaudid and fentanyl patch  2. The patient will be considered for modification of treatment regimen pending response to the procedure performed on today's visit.  3. The patient is to follow-up with primary care physician [facility] evaluation of blood pressure and general medical condition following the procedure performed on today's visit.  4. Surgical evaluation as discussed.  5. Rheumatological evaluation as discussed 6. Neurological evaluation as discussed.  7. The patient may be a candidate for radiofrequency procedures, implantation devices and other treatment pending response to treatment performed on today's visit and follow-up evaluation.  8. The patient has been advised to adhere to proper body mechanics and to avoid activities which may exacerbate the patient's symptoms.   Return appointment to [facility] as scheduled.    Review of Systems     Objective:   Physical Exam        Assessment & Plan:

## 2015-09-04 NOTE — Progress Notes (Signed)
Safety precautions to be maintained throughout the outpatient stay will include: orient to surroundings, keep bed in low position, maintain call bell within reach at all times, provide assistance with transfer out of bed and ambulation.  

## 2015-09-05 ENCOUNTER — Telehealth: Payer: Self-pay | Admitting: *Deleted

## 2015-09-05 NOTE — Telephone Encounter (Signed)
No problems post procedure. 

## 2015-09-20 ENCOUNTER — Ambulatory Visit: Payer: Commercial Managed Care - HMO | Admitting: Physician Assistant

## 2015-09-22 ENCOUNTER — Other Ambulatory Visit: Payer: Self-pay | Admitting: Pain Medicine

## 2015-09-26 ENCOUNTER — Encounter: Payer: Self-pay | Admitting: Physician Assistant

## 2015-10-03 ENCOUNTER — Encounter: Payer: Self-pay | Admitting: Pain Medicine

## 2015-10-03 ENCOUNTER — Ambulatory Visit: Payer: PPO | Attending: Pain Medicine | Admitting: Pain Medicine

## 2015-10-03 VITALS — BP 120/89 | HR 97 | Temp 97.7°F | Resp 16 | Ht 72.0 in | Wt 262.0 lb

## 2015-10-03 DIAGNOSIS — M4316 Spondylolisthesis, lumbar region: Secondary | ICD-10-CM | POA: Diagnosis not present

## 2015-10-03 DIAGNOSIS — M47817 Spondylosis without myelopathy or radiculopathy, lumbosacral region: Secondary | ICD-10-CM | POA: Diagnosis not present

## 2015-10-03 DIAGNOSIS — M542 Cervicalgia: Secondary | ICD-10-CM | POA: Diagnosis not present

## 2015-10-03 DIAGNOSIS — M48062 Spinal stenosis, lumbar region with neurogenic claudication: Secondary | ICD-10-CM

## 2015-10-03 DIAGNOSIS — M5134 Other intervertebral disc degeneration, thoracic region: Secondary | ICD-10-CM | POA: Insufficient documentation

## 2015-10-03 DIAGNOSIS — M5124 Other intervertebral disc displacement, thoracic region: Secondary | ICD-10-CM | POA: Insufficient documentation

## 2015-10-03 DIAGNOSIS — M5030 Other cervical disc degeneration, unspecified cervical region: Secondary | ICD-10-CM | POA: Insufficient documentation

## 2015-10-03 DIAGNOSIS — M170 Bilateral primary osteoarthritis of knee: Secondary | ICD-10-CM

## 2015-10-03 DIAGNOSIS — M546 Pain in thoracic spine: Secondary | ICD-10-CM | POA: Diagnosis not present

## 2015-10-03 DIAGNOSIS — M533 Sacrococcygeal disorders, not elsewhere classified: Secondary | ICD-10-CM | POA: Insufficient documentation

## 2015-10-03 DIAGNOSIS — M79606 Pain in leg, unspecified: Secondary | ICD-10-CM | POA: Diagnosis not present

## 2015-10-03 DIAGNOSIS — M47816 Spondylosis without myelopathy or radiculopathy, lumbar region: Secondary | ICD-10-CM

## 2015-10-03 DIAGNOSIS — M5136 Other intervertebral disc degeneration, lumbar region: Secondary | ICD-10-CM | POA: Diagnosis not present

## 2015-10-03 DIAGNOSIS — Z981 Arthrodesis status: Secondary | ICD-10-CM

## 2015-10-03 DIAGNOSIS — M47812 Spondylosis without myelopathy or radiculopathy, cervical region: Secondary | ICD-10-CM

## 2015-10-03 DIAGNOSIS — M4806 Spinal stenosis, lumbar region: Secondary | ICD-10-CM | POA: Insufficient documentation

## 2015-10-03 DIAGNOSIS — M5416 Radiculopathy, lumbar region: Secondary | ICD-10-CM | POA: Diagnosis not present

## 2015-10-03 DIAGNOSIS — M791 Myalgia: Secondary | ICD-10-CM | POA: Diagnosis not present

## 2015-10-03 MED ORDER — FENTANYL 50 MCG/HR TD PT72: MEDICATED_PATCH | TRANSDERMAL | Status: DC

## 2015-10-03 MED ORDER — HYDROMORPHONE HCL 4 MG PO TABS: ORAL_TABLET | ORAL | Status: DC

## 2015-10-03 NOTE — Patient Instructions (Addendum)
PLAN   Continue present medication Dilaudid and fentanyl patch Follow-up with rheumatologist to discuss medications for arthritis and medication for arthritic pain and continue present dose of Dilaudid and fentanyl patches at this time  F/U PCP Dr.Shah   for evaliation of  BP and general medical  condition  F/U surgical evaluation. May consider  PNCV/EMG studies and other studies pending follow-up evaluations  F/U with your rheumatologist as we discussed  F/U neurological evaluation. May consider pending follow-up evaluations  F/U rheumatological evaluation as neededPain Management Discharge Instructions  General Discharge Instructions :  If you need to reach your doctor call: Monday-Friday 8:00 am - 4:00 pm at [phone] or toll free [phone].  After clinic hours [phone] to have operator reach doctor.  Bring all of your medication bottles to all your appointments in the pain clinic.  To cancel or reschedule your appointment with Pain Management please remember to call 24 hours in advance to avoid a fee.  Refer to the educational materials which you have been given on: General Risks, I had my Procedure. Discharge Instructions, Post Sedation.  Post Procedure Instructions:  The drugs you were given will stay in your system until tomorrow, so for the next 24 hours you should not drive, make any legal decisions or drink any alcoholic beverages.  You may eat anything you prefer, but it is better to start with liquids then soups and crackers, and gradually work up to solid foods.  Please notify your doctor immediately if you have any unusual bleeding, trouble breathing or pain that is not related to your normal pain.  Depending on the type of procedure that was done, some parts of your body may feel week and/or numb.  This usually clears up by tonight or the next day.  Walk with the use of an assistive device or accompanied by an adult for the 24 hours.  You may use ice on  the affected area for the first 24 hours.  Put ice in a Ziploc bag and cover with a towel and place against area 15 minutes on 15 minutes off.  You may switch to heat after 24 hours.

## 2015-10-03 NOTE — Progress Notes (Signed)
Subjective:    Patient ID: Brent Hamilton, male    DOB: 1961-02-26, 55 y.o.   MRN: [id number]  HPI  The patient is a 55 year old woman who returns to pain management for further evaluation and treatment of pain involving the neck entire back upper and lower extremity regions. The patient stated that he had significant improvement of pain with previous procedure block of nerves to the sacroiliac joint decreasing pain from the lower back lower extremity regions to significant degree. We discussed patient's overall condition and at the present time have recommended patient undergo follow-up evaluation with his rheumatologist to consider modification of medications while patient continues to moderate and fentanyl patches as presently prescribed we will we will remain available to consider additional modifications of treatment pending evaluation by rheumatologist and follow-up evaluation with primary care physician. We feel that the rheumatologist may be able to prescribe medications that may significantly decrease patient's pain and enhance the effect of present medications being prescribed for treatment of patient's pain. All in agreement with suggested treatment plan.      Review of Systems     Objective:   Physical Exam  There was tenderness of the splenius capitis and occipitalis musculature region a moderate degree with moderate tenderness over the cervical facet cervical paraspinal musculature region. Palpation over the thoracic facet thoracic paraspinal musculature region was with moderate tenderness to palpation. There was no crepitus of the thoracic region noted. Palpation of the acromial clavicular and glenohumeral joint regions were with moderate discomfort and patient appeared to be with unremarkable Spurling's maneuver. The patient was with decreased grip strength and Tinel and Phalen's maneuver reproduce mild to moderate discomfort. Palpation of the epicondyles of the elbow were attends  to palpation of both the medial and lateral epicondyles. No increased warmth erythema of extremities were noted. Palpation over the lumbar paraspinal muscular treat and lumbar facet region was attends to palpation of moderate degree with lateral bending rotation extension and palpation over the lumbar facets reproducing moderately severe discomfort. There was moderately severe tenderness of the PSIS and PII S region as well as the gluteal and piriformis musculature region. There was mild tenderness of the greater trochanteric region and iliotibial band region. No definite sensory deficit or dermatomal distribution of the lower extremity is noted. DTRs appeared to be trace at the knees. There was negative clonus negative Homans. EHL strength appeared to be slightly decreased. Abdomen nontender with no costovertebral tenderness noted.      Assessment & Plan:    Sacroiliac joint dysfunction  Degenerative disc disease lumbar spine  T12-L1, L1 L2 facet hypertrophy L2-3 facet hypertrophy L4-5 anterior listhesis at this level with mild disc bulging and ligamentous hypertrophy with moderate bilateral L4 foraminal stenosis, L5-S1 facet hypertrophy  Lumbar facet syndrome  Lumbar stenosis with neurogenic claudication  Degenerative disc disease thoracic spine T11-12 circumferential disc bulge T10-11 partially visible degenerative changes of the thoracic  Degenerative disc disease cervical spine     PLAN   Continue present medication Dilaudid and fentanyl patch Follow-up with rheumatologist to discuss medications for arthritis and medication for arthritic pain and continue present dose of Dilaudid and fentanyl patches at this time  F/U PCP Dr.Shah   for evaliation of  BP and general medical  condition  F/U surgical evaluation.  F/U neurological evaluation as discussed May consider  PNCV/EMG studies and other studies pending follow-up evaluations  F/U with your rheumatologist as we discussed. As  discussed rheumatologist may prescribed medications which will  be of significant benefit in terms of reducing pain  The patient is to call pain management should they be significant change in condition or have other concerns regarding condition prior to scheduled return appointment

## 2015-10-03 NOTE — Progress Notes (Signed)
Safety precautions to be maintained throughout the outpatient stay will include: orient to surroundings, keep bed in low position, maintain call bell within reach at all times, provide assistance with transfer out of bed and ambulation.  

## 2015-10-18 ENCOUNTER — Encounter: Payer: Self-pay | Admitting: Physician Assistant

## 2015-10-18 ENCOUNTER — Ambulatory Visit (INDEPENDENT_AMBULATORY_CARE_PROVIDER_SITE_OTHER): Payer: PPO | Admitting: Physician Assistant

## 2015-10-18 VITALS — BP 122/74 | HR 76 | Temp 98.1°F | Resp 20 | Wt 274.0 lb

## 2015-10-18 DIAGNOSIS — F419 Anxiety disorder, unspecified: Secondary | ICD-10-CM

## 2015-10-18 DIAGNOSIS — F32A Depression, unspecified: Secondary | ICD-10-CM

## 2015-10-18 DIAGNOSIS — F329 Major depressive disorder, single episode, unspecified: Secondary | ICD-10-CM | POA: Diagnosis not present

## 2015-10-18 DIAGNOSIS — I10 Essential (primary) hypertension: Secondary | ICD-10-CM | POA: Diagnosis not present

## 2015-10-18 DIAGNOSIS — E291 Testicular hypofunction: Secondary | ICD-10-CM | POA: Diagnosis not present

## 2015-10-18 DIAGNOSIS — R7989 Other specified abnormal findings of blood chemistry: Secondary | ICD-10-CM

## 2015-10-18 DIAGNOSIS — Z125 Encounter for screening for malignant neoplasm of prostate: Secondary | ICD-10-CM | POA: Diagnosis not present

## 2015-10-18 LAB — CBC WITH DIFFERENTIAL/PLATELET
Basophils Absolute: 0 cells/uL (ref 0–200)
Basophils Relative: 0 %
EOS PCT: 1 %
Eosinophils Absolute: 102 cells/uL (ref 15–500)
HCT: 50.3 % — ABNORMAL HIGH (ref 38.5–50.0)
HEMOGLOBIN: 17.2 g/dL — AB (ref 13.0–17.0)
LYMPHS ABS: 1632 {cells}/uL (ref 850–3900)
Lymphocytes Relative: 16 %
MCH: 32.3 pg (ref 27.0–33.0)
MCHC: 34.2 g/dL (ref 32.0–36.0)
MCV: 94.4 fL (ref 80.0–100.0)
MONO ABS: 1122 {cells}/uL — AB (ref 200–950)
MPV: 9.6 fL (ref 7.5–12.5)
Monocytes Relative: 11 %
NEUTROS ABS: 7344 {cells}/uL (ref 1500–7800)
NEUTROS PCT: 72 %
Platelets: 188 10*3/uL (ref 140–400)
RBC: 5.33 MIL/uL (ref 4.20–5.80)
RDW: 14.7 % (ref 11.0–15.0)
WBC: 10.2 10*3/uL (ref 3.8–10.8)

## 2015-10-18 LAB — COMPLETE METABOLIC PANEL WITH GFR
ALBUMIN: 4 g/dL (ref 3.6–5.1)
ALK PHOS: 62 U/L (ref 40–115)
ALT: 34 U/L (ref 9–46)
AST: 29 U/L (ref 10–35)
BUN: 9 mg/dL (ref 7–25)
CO2: 26 mmol/L (ref 20–31)
CREATININE: 1.07 mg/dL (ref 0.70–1.33)
Calcium: 9.4 mg/dL (ref 8.6–10.3)
Chloride: 97 mmol/L — ABNORMAL LOW (ref 98–110)
GFR, Est African American: 89 mL/min (ref >=60)
GFR, Est Non African American: 77 mL/min (ref >=60)
GLUCOSE: 89 mg/dL (ref 70–99)
POTASSIUM: 3.9 mmol/L (ref 3.5–5.3)
SODIUM: 136 mmol/L (ref 135–146)
Total Bilirubin: 0.5 mg/dL (ref 0.2–1.2)
Total Protein: 6.7 g/dL (ref 6.1–8.1)

## 2015-10-18 MED ORDER — ALPRAZOLAM 0.5 MG PO TABS: ORAL_TABLET | ORAL | Status: DC

## 2015-10-19 LAB — PSA: PSA: 2.82 ng/mL (ref <=4.00)

## 2015-10-19 LAB — TESTOSTERONE: Testosterone: 519 ng/dL (ref 250–827)

## 2015-10-19 NOTE — Progress Notes (Signed)
Patient ID: Brent Hamilton MRN: [id number], DOB: 02/12/1961, 55 y.o. Date of Encounter: 10/19/2015, 7:12 AM    Chief Complaint:  Chief Complaint  Patient presents with  . 7 wk follow up     HPI: 55 y.o. year old white male here for above.   THE FOLLOWING IS COPIED FROM OV NOTE 06/19/2015: He has only had one OV with me prior to today--9/2/12016---I reviewed that note today. See that note regarding history regarding anxiety/panic and use of Xanax.   Today he states that 1 week prior to Thanksgiving, he "was canned" regarding his job. 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  Wasn't wanting to do anything--not even go with family out to eat.  Says that 2 years ago, he felt similar when his dad passed away. At that time he was prescried Wellbutrin. That worked well for him. Says he found his old bottle and started that. Is noticing improvement in symptoms already. Is getting up in mornings, etc.   Also, needs refill on Xanax. Last Rx was 05/04/15 for # 90 + 0. Says he uses this as sparingly as he can, but recently has been having to take one every day and one every night to sleep.   ASSESSMENT/PLAN AT THAT OV: 1. Depression He is to take Wellbutrin as directed. If feels worse or develops adverse effects, call us immediately. Otherwise, will plan f/u OV 2 months.  - buPROPion (WELLBUTRIN XL) 150 MG 24 hr tablet; Take 1 tablet (150 mg total) by mouth daily.  Dispense: 30 tablet; Refill: 3  2. Anxiety He knows to use this as sparingly as possible.  - ALPRAZolam (XANAX) 0.5 MG tablet; Take 1 tablet (0.5 mg total) by mouth 3 (three) times daily as needed for anxiety.  Dispense: 90 tablet; Refill:  1    --08/24/2015: Today he reports that he is feeling better since he has been on the Wellbutrin. 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 Says he has actually gotten some job offers but they are in [place] and [place] and he does not feel that a move like that would be good for him right now. Says that he definitely is feeling better but says that he still is not feeling "good." Says when he came to the last visit he was staying in bed until 2 PM.   Says that he "was in a bad place last time I came in." Regarding his Xanax use, he says he was doing 2 per day then 3 per day and over the last couple weeks has been limiting to 2 per day. Says that he knows these can be addicting and knows that he needs to limit these. He is having no adverse effects with the Wellbutrin.    10/18/2015: Patient reports he took the Wellbutrin for about 1-1/2 months but then he was sweating all the time so he weaned himself off. Is now completely off of the Wellbutrin. Says that he feels fine. Says that he has been offered 6 or 7 jobs but is going to be selective about what job he takes next. Says that they are getting ready  to move into a new house. Says he has been using the Xanax twice a day but says that he is fine with weaning that down and says that I can make the next prescription for #30. Says he does need refill on Xanax and testosterone. Says that Dr. Primus Bravo wants me to refer him to a rheumatologist. He says that he saw Dr. Brigitte Pulse in [place] in the past and is fine with seeing Dr. Brigitte Pulse again but he isn't sure whether he is out of his network for his insurance or not and so he will check into this and get back with me. No other complaints or concerns.  Home Meds:   Outpatient Prescriptions Prior to Visit  Medication Sig Dispense Refill  . cholecalciferol (VITAMIN D) 1000 UNITS tablet Take 1,000 Units by mouth.    . fentaNYL (DURAGESIC - DOSED MCG/HR) 50 MCG/HR Apply 1 patch to  skin every other day if tolerated 15 patch 0  . FIBER PO Take 1 tablet by mouth 2 (two) times daily.    Marland Kitchen HYDROmorphone (DILAUDID) 4 MG tablet Limit one half to one tablet by mouth per day or twice per day if tolerated if tolerated for breakthrough pain while wearing fentanyl patch 60 tablet 0  . lisinopril (PRINIVIL,ZESTRIL) 40 MG tablet Take 1 tablet (40 mg total) by mouth daily. 90 tablet 1  . testosterone cypionate (DEPOTESTOSTERONE CYPIONATE) 200 MG/ML injection Inject 1 mL (200 mg total) into the muscle every 14 (fourteen) days. 10 mL 1  . ALPRAZolam (XANAX) 0.5 MG tablet Take 1 tablet (0.5 mg total) by mouth 2 (two) times daily as needed for anxiety. 60 tablet 1  . buPROPion (WELLBUTRIN XL) 300 MG 24 hr tablet Take 1 tablet (300 mg total) by mouth daily. (Patient not taking: Reported on 10/03/2015) 30 tablet 1  . cefUROXime (CEFTIN) 250 MG tablet Take 1 tablet (250 mg total) by mouth 2 (two) times daily with a meal. (Patient not taking: Reported on 10/03/2015) 14 tablet 0   Facility-Administered Medications Prior to Visit  Medication Dose Route Frequency Provider Last Rate Last Dose  . bupivacaine (PF) (MARCAINE) 0.25 % injection 30 mL  30 mL Other Once Mohammed Kindle, MD      . ceFAZolin (ANCEF) IVPB 1 g/50 mL premix  1 g Intravenous Once Mohammed Kindle, MD      . ceFAZolin (ANCEF) IVPB 1 g/50 mL premix  1 g Intravenous Once Mohammed Kindle, MD      . fentaNYL (SUBLIMAZE) injection 100 mcg  100 mcg Intravenous Once Mohammed Kindle, MD      . lactated ringers infusion 1,000 mL  1,000 mL Intravenous Continuous Mohammed Kindle, MD      . lactated ringers infusion 1,000 mL  1,000 mL Intravenous Continuous Mohammed Kindle, MD      . lactated ringers infusion 1,000 mL  1,000 mL Intravenous Continuous Mohammed Kindle, MD      . lidocaine (PF) (XYLOCAINE) 1 % injection 10 mL  10 mL Subcutaneous Once Mohammed Kindle, MD      . midazolam (VERSED) 5 MG/5ML injection 5 mg  5 mg Intravenous Once Mohammed Kindle, MD       . midazolam (VERSED) 5 MG/5ML injection 5 mg  5 mg Intravenous Once Mohammed Kindle, MD      . orphenadrine (NORFLEX) injection 60 mg  60 mg Intramuscular Once Mohammed Kindle, MD      . orphenadrine (NORFLEX) injection 60 mg  60 mg Intramuscular Once Mohammed Kindle, MD      .  triamcinolone acetonide (KENALOG-40) injection 40 mg  40 mg Other Once Mohammed Kindle, MD        Allergies:  Allergies  Allergen Reactions  . Flonase [Fluticasone] Swelling, Anaphylaxis and Rash  . Oxycodone Swelling  . Morphine And Related Itching and Rash    Rash developed after about 10 days of taking the tablets      Review of Systems: See HPI for pertinent ROS. All other ROS negative.    Physical Exam: Blood pressure 122/74, pulse 76, temperature 98.1 F (36.7 C), temperature source Oral, resp. rate 20, weight 274 lb (124.286 kg)., Body mass index is 37.15 kg/(m^2). General:  WNWD WM. Appears in no acute distress. Neck: Supple. No thyromegaly. No lymphadenopathy. Lungs: Clear bilaterally to auscultation without wheezes, rales, or rhonchi. Breathing is unlabored. Heart: Regular rhythm. No murmurs, rubs, or gallops. Msk:  Strength and tone normal for age. Extremities/Skin: Warm and dry. Neuro: Alert and oriented X 3. Moves all extremities spontaneously. Gait is normal. CNII-XII grossly in tact. Psych:  Responds to questions appropriately with a normal affect.     ASSESSMENT AND PLAN:  55 y.o. year old male with   1. Depression Stable/Controlled/Resolved--was situational.  - ALPRAZolam (XANAX) 0.5 MG tablet; Take one daily as needed for panic/anxiety.  Dispense: 30 tablet; Refill: 0  2. Anxiety Stable/Controlled---will wean off Xanax. Has been taking BID. Decrease to QD PRN - ALPRAZolam (XANAX) 0.5 MG tablet; Take one daily as needed for panic/anxiety.  Dispense: 30 tablet; Refill: 0  3. Benign hypertension Blood pressure is controlled/at goal. Continue current medications. Check lab to monitor. -  COMPLETE METABOLIC PANEL WITH GFR  4. Decreased testosterone level Check labs to monitor. Patient states that he had his last testosterone injection 2 weeks ago. ---------------------------------WILL DISCUSS ORDERING DEXA SCAN AT NEXT OV------------------------------------------------------------ - CBC with Differential/Platelet - Testosterone - PSA    Signed, Karis Juba, [place], [facility] 10/19/2015 7:12 AM

## 2015-10-23 ENCOUNTER — Encounter: Payer: Self-pay | Admitting: Physician Assistant

## 2015-10-27 ENCOUNTER — Other Ambulatory Visit: Payer: Self-pay | Admitting: Family Medicine

## 2015-10-27 MED ORDER — TESTOSTERONE CYPIONATE 200 MG/ML IM SOLN: 200.0000 mg | INTRAMUSCULAR | Status: DC

## 2015-10-27 NOTE — Telephone Encounter (Signed)
Testosterone called into pharmacy. 

## 2015-10-31 ENCOUNTER — Encounter: Payer: Self-pay | Admitting: Family Medicine

## 2015-10-31 ENCOUNTER — Ambulatory Visit: Payer: PPO | Attending: Pain Medicine | Admitting: Pain Medicine

## 2015-10-31 ENCOUNTER — Encounter: Payer: Self-pay | Admitting: Pain Medicine

## 2015-10-31 VITALS — BP 127/93 | HR 87 | Temp 98.1°F | Resp 16 | Ht 72.0 in | Wt 262.0 lb

## 2015-10-31 DIAGNOSIS — M791 Myalgia: Secondary | ICD-10-CM | POA: Diagnosis not present

## 2015-10-31 DIAGNOSIS — M47816 Spondylosis without myelopathy or radiculopathy, lumbar region: Secondary | ICD-10-CM

## 2015-10-31 DIAGNOSIS — M47812 Spondylosis without myelopathy or radiculopathy, cervical region: Secondary | ICD-10-CM

## 2015-10-31 DIAGNOSIS — M533 Sacrococcygeal disorders, not elsewhere classified: Secondary | ICD-10-CM | POA: Diagnosis not present

## 2015-10-31 DIAGNOSIS — M19012 Primary osteoarthritis, left shoulder: Secondary | ICD-10-CM | POA: Diagnosis not present

## 2015-10-31 DIAGNOSIS — M5134 Other intervertebral disc degeneration, thoracic region: Secondary | ICD-10-CM | POA: Insufficient documentation

## 2015-10-31 DIAGNOSIS — Z981 Arthrodesis status: Secondary | ICD-10-CM

## 2015-10-31 DIAGNOSIS — M5124 Other intervertebral disc displacement, thoracic region: Secondary | ICD-10-CM | POA: Insufficient documentation

## 2015-10-31 DIAGNOSIS — M546 Pain in thoracic spine: Secondary | ICD-10-CM | POA: Diagnosis not present

## 2015-10-31 DIAGNOSIS — M069 Rheumatoid arthritis, unspecified: Secondary | ICD-10-CM | POA: Insufficient documentation

## 2015-10-31 DIAGNOSIS — M542 Cervicalgia: Secondary | ICD-10-CM | POA: Diagnosis not present

## 2015-10-31 DIAGNOSIS — M5136 Other intervertebral disc degeneration, lumbar region: Secondary | ICD-10-CM | POA: Diagnosis not present

## 2015-10-31 DIAGNOSIS — M4806 Spinal stenosis, lumbar region: Secondary | ICD-10-CM | POA: Insufficient documentation

## 2015-10-31 DIAGNOSIS — M4316 Spondylolisthesis, lumbar region: Secondary | ICD-10-CM | POA: Diagnosis not present

## 2015-10-31 DIAGNOSIS — M5030 Other cervical disc degeneration, unspecified cervical region: Secondary | ICD-10-CM | POA: Diagnosis not present

## 2015-10-31 DIAGNOSIS — M5126 Other intervertebral disc displacement, lumbar region: Secondary | ICD-10-CM | POA: Insufficient documentation

## 2015-10-31 DIAGNOSIS — M19011 Primary osteoarthritis, right shoulder: Secondary | ICD-10-CM | POA: Diagnosis not present

## 2015-10-31 DIAGNOSIS — M47817 Spondylosis without myelopathy or radiculopathy, lumbosacral region: Secondary | ICD-10-CM | POA: Diagnosis not present

## 2015-10-31 DIAGNOSIS — M5416 Radiculopathy, lumbar region: Secondary | ICD-10-CM | POA: Diagnosis not present

## 2015-10-31 DIAGNOSIS — M48062 Spinal stenosis, lumbar region with neurogenic claudication: Secondary | ICD-10-CM

## 2015-10-31 DIAGNOSIS — M170 Bilateral primary osteoarthritis of knee: Secondary | ICD-10-CM

## 2015-10-31 DIAGNOSIS — M79606 Pain in leg, unspecified: Secondary | ICD-10-CM | POA: Diagnosis not present

## 2015-10-31 MED ORDER — FENTANYL 50 MCG/HR TD PT72: MEDICATED_PATCH | TRANSDERMAL | Status: DC

## 2015-10-31 MED ORDER — HYDROMORPHONE HCL 4 MG PO TABS: ORAL_TABLET | ORAL | Status: DC

## 2015-10-31 NOTE — Progress Notes (Signed)
Safety precautions to be maintained throughout the outpatient stay will include: orient to surroundings, keep bed in low position, maintain call bell within reach at all times, provide assistance with transfer out of bed and ambulation.  

## 2015-10-31 NOTE — Progress Notes (Signed)
Subjective:    Patient ID: Brent Hamilton, male    DOB: 1960-07-02, 55 y.o.   MRN: [id number]  HPI  The patient is a 55 year old gentleman who returns to pain management for further evaluation and treatment of pain involving the region of the neck entire back upper and lower extremity regions. The patient has diagnoses of rheumatoid arthritis and is with significant degenerative changes noted throughout the cervical thoracic and lumbar spine. On today's visit patient complained of pain involving the region of the shoulder. The patient denied any trauma change in events of daily living the cost change in symptomatology. Patient stated the pain increased with region lifting pushing pulling maneuvers. We discussed patient's condition and will consider patient for interventional treatment at time return appointment of severely disabling pain of the left shoulder region. We will also discussed obtaining MRI which we will consider pending follow-up evaluation and response to present treatment on today's visit the patient was significantly limited range of motion of the left shoulder. We'll continue presently prescribed medications and will proceed with injection of the left shoulder at time return appointment in attempt to decrease severity of symptoms, minimize progression of symptoms, and avoid need for more involved treatment. The patient agreed to suggested treatment plan.   Review of Systems     Objective:   Physical Exam   There was tenderness to palpation of the splenius capitis and occipitalis musculature regions of moderate degree. There was moderate tenderness over the cervical facet cervical paraspinal musculature region. Palpation of the acromioclavicular and glenohumeral joint region reproduced severe pain on the left compared to the right. The patient was significantly limited range of motion of the left shoulder compared to the right shoulder and was unable to perform drop test with the  left upper extremity. The patient was unable to ABduct the left upper extremity to 90 Tinel and Phalen's maneuver were without increase of pain significant degree and patient appeared to be with decreased grip strength. There was tenderness of the medial and lateral epicondyles of the left and right elbows to moderate degree. Palpation over the thoracic region thoracic facet region was attends to palpation with evidence of moderate muscle spasms involving the thoracic paraspinal musculature region. Palpation over the lumbar paraspinal muscular treat and lumbar facet region was attends to palpation of moderate degree with lateral bending rotation extension and palpation of the lumbar facets reproducing moderate discomfort. Palpation over the region of the PSIS and PII S region reproduced pain of moderate degree. Was moderate tends to palpation of the greater trochanteric region iliotibial band region. Straight leg raising was tolerates approximately 20 without a definite increase of pain with dorsiflexion noted. DTRs were difficult to elicit. No definite sensory deficit or dermatomal dystrophy detected. There was negative clonus negative Homans. Abdomen nontender with no costovertebral angle tenderness noted      Assessment & Plan:   Degenerative joint disease of shoulders  Degenerative disc disease lumbar spine  T12-L1, L1 L2 facet hypertrophy L2-3 facet hypertrophy L4-5 anterior listhesis at this level with mild disc bulging and ligamentous hypertrophy with moderate bilateral L4 foraminal stenosis, L5-S1 facet hypertrophy  Lumbar facet syndrome  Lumbar stenosis with neurogenic claudication  Degenerative disc disease thoracic spine T11-12 circumferential disc bulge T10-11 partially visible degenerative changes of the thoracic  Degenerative disc disease cervical spine  Rheumatoid arthritis      PLAN   Continue present medication Dilaudid and fentanyl patch Follow-up with rheumatologist  to discuss medications for arthritis  and medication for arthritic pain and continue present dose of Dilaudid and fentanyl patches as previously discussed  Shoulder injection to be performed at time of return appointment  F/U PCP Dr.Shah  for evaliation of  BP and general medical  condition  F/U surgical evaluation. May consider  PNCV/EMG studies and other studies pending follow-up evaluations  F/U with your rheumatologist as we discussed  F/U neurological evaluation. May consider pending follow-up evaluations  May consider radiofrequency procedures and implantation devices and other treatment pending follow-up evaluations  Patient is to call pain management for concerns regarding condition prior to scheduled return appointment

## 2015-10-31 NOTE — Patient Instructions (Addendum)
PLAN   Continue present medication Dilaudid and fentanyl patch Follow-up with rheumatologist to discuss medications for arthritis and medication for arthritic pain and continue present dose of Dilaudid and fentanyl patches as previously discussed  Shoulder injection to be performed at time of return appointment  F/U PCP Dr.Shah  for evaliation of  BP and general medical  condition  F/U surgical evaluation. May consider  PNCV/EMG studies and other studies pending follow-up evaluations  F/U with your rheumatologist as we discussed  F/U neurological evaluation. May consider pending follow-up evaluations  May consider radiofrequency procedures and implantation devices and other treatment pending follow-up evaluations  Patient is to call pain management for concerns regarding condition prior to scheduled return appointmentTrigger Point Injection Trigger points are areas where you have muscle pain. A trigger point injection is a shot given in the trigger point to relieve that pain. A trigger point might feel like a knot in your muscle. It hurts to press on a trigger point. Sometimes the pain spreads out (radiates) to other parts of the body. For example, pressing on a trigger point in your shoulder might cause pain in your arm or neck. You might have one trigger point. Or, you might have more than one. People often have trigger points in their upper back and lower back. They also occur often in the neck and shoulders. Pain from a trigger point lasts for a long time. It can make it hard to keep moving. You might not be able to do the exercise or physical therapy that could help you deal with the pain. A trigger point injection may help. It does not work for everyone. But, it may relieve your pain for a few days or a few months. A trigger point injection does not cure long-lasting (chronic) pain. LET YOUR CAREGIVER KNOW ABOUT:  Any allergies (especially to latex, lidocaine, or  steroids).  Blood-thinning medicines that you take. These drugs can lead to bleeding or bruising after an injection. They include:  Aspirin.  Ibuprofen.  Clopidogrel.  Warfarin.  Other medicines you take. This includes all vitamins, herbs, eyedrops, over-the-counter medicines, and creams.  Use of steroids.  Recent infections.  Past problems with numbing medicines.  Bleeding problems.  Surgeries you have had.  Other health problems. RISKS AND COMPLICATIONS A trigger point injection is a safe treatment. However, problems may develop, such as:  Minor side effects usually go away in 1 to 2 days. These may include:  Soreness.  Bruising.  Stiffness.  More serious problems are rare. But, they may include:  Bleeding under the skin (hematoma).  Skin infection.  Breaking off of the needle under your skin.  Lung puncture.  The trigger point injection may not work for you. BEFORE THE PROCEDURE You may need to stop taking any medicine that thins your blood. This is to prevent bleeding and bruising. Usually these medicines are stopped several days before the injection. No other preparation is needed. PROCEDURE  A trigger point injection can be given in your caregiver's office or in a clinic. Each injection takes 2 minutes or less.  Your caregiver will feel for trigger points. The caregiver may use a marker to circle the area for the injection.  The skin over the trigger point will be washed with a germ-killing (antiseptic) solution.  The caregiver pinches the spot for the injection.  Then, a very thin needle is used for the shot. You may feel pain or a twitching feeling when the needle enters the trigger point.  A numbing solution may be injected into the trigger point. Sometimes a drug to keep down swelling, redness, and warmth (inflammation) is also injected.  Your caregiver moves the needle around the trigger zone until the tightness and twitching goes  away.  After the injection, your caregiver may put gentle pressure over the injection site.  Then it is covered with a bandage. AFTER THE PROCEDURE  You can go right home after the injection.  The bandage can be taken off after a few hours.  You may feel sore and stiff for 1 to 2 days.  Go back to your regular activities slowly. Your caregiver may ask you to stretch your muscles. Do not do anything that takes extra energy for a few days.  Follow your caregiver's instructions to manage and treat other pain.   This information is not intended to replace advice given to you by your health care provider. Make sure you discuss any questions you have with your health care provider.   Document Released: 06/06/2011 Document Revised: 10/12/2012 Document Reviewed: 06/06/2011 Elsevier Interactive Patient Education 2016 [place]  What are the risk, side effects and possible complications? Generally speaking, most procedures are safe.  However, with any procedure there are risks, side effects, and the possibility of complications.  The risks and complications are dependent upon the sites that are lesioned, or the type of nerve block to be performed.  The closer the procedure is to the spine, the more serious the risks are.  Great care is taken when placing the radio frequency needles, block needles or lesioning probes, but sometimes complications can occur.  Infection: Any time there is an injection through the skin, there is a risk of infection.  This is why sterile conditions are used for these blocks.  There are four possible types of infection.  Localized skin infection.  Central Nervous System Infection-This can be in the form of Meningitis, which can be deadly.  Epidural Infections-This can be in the form of an epidural abscess, which can cause pressure inside of the spine, causing compression of the spinal cord with subsequent paralysis. This would  require an emergency surgery to decompress, and there are no guarantees that the patient would recover from the paralysis.  Discitis-This is an infection of the intervertebral discs.  It occurs in about 1% of discography procedures.  It is difficult to treat and it may lead to surgery.        2. Pain: the needles have to go through skin and soft tissues, will cause soreness.       3. Damage to internal structures:  The nerves to be lesioned may be near blood vessels or    other nerves which can be potentially damaged.       4. Bleeding: Bleeding is more common if the patient is taking blood thinners such as  aspirin, Coumadin, Ticiid, Plavix, etc., or if he/she have some genetic predisposition  such as hemophilia. Bleeding into the spinal canal can cause compression of the spinal  cord with subsequent paralysis.  This would require an emergency surgery to  decompress and there are no guarantees that the patient would recover from the  paralysis.       5. Pneumothorax:  Puncturing of a lung is a possibility, every time a needle is introduced in  the area of the chest or upper back.  Pneumothorax refers to free air around the  collapsed lung(s), inside of the thoracic cavity (chest  cavity).  Another two possible  complications related to a similar event would include: Hemothorax and Chylothorax.   These are variations of the Pneumothorax, where instead of air around the collapsed  lung(s), you may have blood or chyle, respectively.       6. Spinal headaches: They may occur with any procedures in the area of the spine.       7. Persistent CSF (Cerebro-Spinal Fluid) leakage: This is a rare problem, but may occur  with prolonged intrathecal or epidural catheters either due to the formation of a fistulous  track or a dural tear.       8. Nerve damage: By working so close to the spinal cord, there is always a possibility of  nerve damage, which could be as serious as a permanent spinal cord injury with   paralysis.       9. Death:  Although rare, severe deadly allergic reactions known as "Anaphylactic  reaction" can occur to any of the medications used.      10. Worsening of the symptoms:  We can always make thing worse.  What are the chances of something like this happening? Chances of any of this occuring are extremely low.  By statistics, you have more of a chance of getting killed in a motor vehicle accident: while driving to the hospital than any of the above occurring .  Nevertheless, you should be aware that they are possibilities.  In general, it is similar to taking a shower.  Everybody knows that you can slip, hit your head and get killed.  Does that mean that you should not shower again?  Nevertheless always keep in mind that statistics do not mean anything if you happen to be on the wrong side of them.  Even if a procedure has a 1 (one) in a 1,000,000 (million) chance of going wrong, it you happen to be that one..Also, keep in mind that by statistics, you have more of a chance of having something go wrong when taking medications.  Who should not have this procedure? If you are on a blood thinning medication (e.g. Coumadin, Plavix, see list of "Blood Thinners"), or if you have an active infection going on, you should not have the procedure.  If you are taking any blood thinners, please inform your physician.  How should I prepare for this procedure?  Do not eat or drink anything at least six hours prior to the procedure.  Bring a driver with you .  It cannot be a taxi.  Come accompanied by an adult that can drive you back, and that is strong enough to help you if your legs get weak or numb from the local anesthetic.  Take all of your medicines the morning of the procedure with just enough water to swallow them.  If you have diabetes, make sure that you are scheduled to have your procedure done first thing in the morning, whenever possible.  If you have diabetes, take only half of  your insulin dose and notify our nurse that you have done so as soon as you arrive at the clinic.  If you are diabetic, but only take blood sugar pills (oral hypoglycemic), then do not take them on the morning of your procedure.  You may take them after you have had the procedure.  Do not take aspirin or any aspirin-containing medications, at least eleven (11) days prior to the procedure.  They may prolong bleeding.  Wear loose fitting clothing that may be  easy to take off and that you would not mind if it got stained with Betadine or blood.  Do not wear any jewelry or perfume  Remove any nail coloring.  It will interfere with some of our monitoring equipment.  NOTE: Remember that this is not meant to be interpreted as a complete list of all possible complications.  Unforeseen problems may occur.  BLOOD THINNERS The following drugs contain aspirin or other products, which can cause increased bleeding during surgery and should not be taken for 2 weeks prior to and 1 week after surgery.  If you should need take something for relief of minor pain, you may take acetaminophen which is found in Tylenol,m Datril, Anacin-3 and Panadol. It is not blood thinner. The products listed below are.  Do not take any of the products listed below in addition to any listed on your instruction sheet.  A.P.C or A.P.C with Codeine Codeine Phosphate Capsules #3 Ibuprofen Ridaura  ABC compound Congesprin Imuran rimadil  Advil Cope Indocin Robaxisal  Alka-Seltzer Effervescent Pain Reliever and Antacid Coricidin or Coricidin-D  Indomethacin Rufen  Alka-Seltzer plus Cold Medicine Cosprin Ketoprofen S-A-C Tablets  Anacin Analgesic Tablets or Capsules Coumadin Korlgesic Salflex  Anacin Extra Strength Analgesic tablets or capsules CP-2 Tablets Lanoril Salicylate  Anaprox Cuprimine Capsules Levenox Salocol  Anexsia-D Dalteparin Magan Salsalate  Anodynos Darvon compound Magnesium Salicylate Sine-off  Ansaid Dasin  Capsules Magsal Sodium Salicylate  Anturane Depen Capsules Marnal Soma  APF Arthritis pain formula Dewitt's Pills Measurin Stanback  Argesic Dia-Gesic Meclofenamic Sulfinpyrazone  Arthritis Bayer Timed Release Aspirin Diclofenac Meclomen Sulindac  Arthritis pain formula Anacin Dicumarol Medipren Supac  Analgesic (Safety coated) Arthralgen Diffunasal Mefanamic Suprofen  Arthritis Strength Bufferin Dihydrocodeine Mepro Compound Suprol  Arthropan liquid Dopirydamole Methcarbomol with Aspirin Synalgos  ASA tablets/Enseals Disalcid Micrainin Tagament  Ascriptin Doan's Midol Talwin  Ascriptin A/D Dolene Mobidin Tanderil  Ascriptin Extra Strength Dolobid Moblgesic Ticlid  Ascriptin with Codeine Doloprin or Doloprin with Codeine Momentum Tolectin  Asperbuf Duoprin Mono-gesic Trendar  Aspergum Duradyne Motrin or Motrin IB Triminicin  Aspirin plain, buffered or enteric coated Durasal Myochrisine Trigesic  Aspirin Suppositories Easprin Nalfon Trillsate  Aspirin with Codeine Ecotrin Regular or Extra Strength Naprosyn Uracel  Atromid-S Efficin Naproxen Ursinus  Auranofin Capsules Elmiron Neocylate Vanquish  Axotal Emagrin Norgesic Verin  Azathioprine Empirin or Empirin with Codeine Normiflo Vitamin E  Azolid Emprazil Nuprin Voltaren  Bayer Aspirin plain, buffered or children's or timed BC Tablets or powders Encaprin Orgaran Warfarin Sodium  Buff-a-Comp Enoxaparin Orudis Zorpin  Buff-a-Comp with Codeine Equegesic Os-Cal-Gesic   Buffaprin Excedrin plain, buffered or Extra Strength Oxalid   Bufferin Arthritis Strength Feldene Oxphenbutazone   Bufferin plain or Extra Strength Feldene Capsules Oxycodone with Aspirin   Bufferin with Codeine Fenoprofen Fenoprofen Pabalate or Pabalate-SF   Buffets II Flogesic Panagesic   Buffinol plain or Extra Strength Florinal or Florinal with Codeine Panwarfarin   Buf-Tabs Flurbiprofen Penicillamine   Butalbital Compound Four-way cold tablets Penicillin    Butazolidin Fragmin Pepto-Bismol   Carbenicillin Geminisyn Percodan   Carna Arthritis Reliever Geopen Persantine   Carprofen Gold's salt Persistin   Chloramphenicol Goody's Phenylbutazone   Chloromycetin Haltrain Piroxlcam   Clmetidine heparin Plaquenil   Cllnoril Hyco-pap Ponstel   Clofibrate Hydroxy chloroquine Propoxyphen         Before stopping any of these medications, be sure to consult the physician who ordered them.  Some, such as Coumadin (Warfarin) are ordered to prevent or treat serious conditions such as "deep  thrombosis", "pumonary embolisms", and other heart problems.  The amount of time that you may need off of the medication may also vary with the medication and the reason for which you were taking it.  If you are taking any of these medications, please make sure you notify your pain physician before you undergo any procedures.

## 2015-11-16 ENCOUNTER — Encounter: Payer: Self-pay | Admitting: Pain Medicine

## 2015-11-16 ENCOUNTER — Other Ambulatory Visit: Payer: Self-pay | Admitting: Pain Medicine

## 2015-11-16 DIAGNOSIS — M48062 Spinal stenosis, lumbar region with neurogenic claudication: Secondary | ICD-10-CM

## 2015-11-16 DIAGNOSIS — M5030 Other cervical disc degeneration, unspecified cervical region: Secondary | ICD-10-CM

## 2015-11-16 DIAGNOSIS — M533 Sacrococcygeal disorders, not elsewhere classified: Secondary | ICD-10-CM

## 2015-11-16 DIAGNOSIS — M47816 Spondylosis without myelopathy or radiculopathy, lumbar region: Secondary | ICD-10-CM

## 2015-11-16 DIAGNOSIS — Z981 Arthrodesis status: Secondary | ICD-10-CM

## 2015-11-16 DIAGNOSIS — M170 Bilateral primary osteoarthritis of knee: Secondary | ICD-10-CM

## 2015-11-16 DIAGNOSIS — M5136 Other intervertebral disc degeneration, lumbar region: Secondary | ICD-10-CM

## 2015-11-16 DIAGNOSIS — M47812 Spondylosis without myelopathy or radiculopathy, cervical region: Secondary | ICD-10-CM

## 2015-11-17 ENCOUNTER — Emergency Department (HOSPITAL_COMMUNITY): Payer: PPO

## 2015-11-17 ENCOUNTER — Emergency Department (HOSPITAL_COMMUNITY)
Admission: EM | Admit: 2015-11-17 | Discharge: 2015-11-17 | Disposition: A | Discharge status: Home / Self Care | Payer: PPO | Attending: Emergency Medicine | Admitting: Emergency Medicine

## 2015-11-17 ENCOUNTER — Encounter (HOSPITAL_COMMUNITY): Payer: Self-pay

## 2015-11-17 DIAGNOSIS — M545 Low back pain, unspecified: Secondary | ICD-10-CM

## 2015-11-17 DIAGNOSIS — M549 Dorsalgia, unspecified: Secondary | ICD-10-CM

## 2015-11-17 DIAGNOSIS — I10 Essential (primary) hypertension: Secondary | ICD-10-CM | POA: Insufficient documentation

## 2015-11-17 DIAGNOSIS — Z79891 Long term (current) use of opiate analgesic: Secondary | ICD-10-CM | POA: Insufficient documentation

## 2015-11-17 DIAGNOSIS — Z9889 Other specified postprocedural states: Secondary | ICD-10-CM | POA: Insufficient documentation

## 2015-11-17 DIAGNOSIS — E669 Obesity, unspecified: Secondary | ICD-10-CM | POA: Insufficient documentation

## 2015-11-17 DIAGNOSIS — Z9989 Dependence on other enabling machines and devices: Secondary | ICD-10-CM | POA: Diagnosis not present

## 2015-11-17 DIAGNOSIS — G8929 Other chronic pain: Secondary | ICD-10-CM | POA: Diagnosis not present

## 2015-11-17 DIAGNOSIS — E785 Hyperlipidemia, unspecified: Secondary | ICD-10-CM | POA: Diagnosis not present

## 2015-11-17 DIAGNOSIS — Z87891 Personal history of nicotine dependence: Secondary | ICD-10-CM | POA: Diagnosis not present

## 2015-11-17 DIAGNOSIS — R200 Anesthesia of skin: Secondary | ICD-10-CM | POA: Diagnosis not present

## 2015-11-17 DIAGNOSIS — Z79899 Other long term (current) drug therapy: Secondary | ICD-10-CM | POA: Diagnosis not present

## 2015-11-17 DIAGNOSIS — Z6835 Body mass index (BMI) 35.0-35.9, adult: Secondary | ICD-10-CM | POA: Diagnosis not present

## 2015-11-17 DIAGNOSIS — R279 Unspecified lack of coordination: Secondary | ICD-10-CM | POA: Diagnosis not present

## 2015-11-17 LAB — URINALYSIS, ROUTINE W REFLEX MICROSCOPIC
Bilirubin Urine: NEGATIVE
GLUCOSE, UA: NEGATIVE mg/dL
Hgb urine dipstick: NEGATIVE
KETONES UR: NEGATIVE mg/dL
LEUKOCYTES UA: NEGATIVE
Nitrite: NEGATIVE
PH: 5.5 (ref 5.0–8.0)
Protein, ur: NEGATIVE mg/dL
Specific Gravity, Urine: <1.005 — ABNORMAL LOW (ref 1.005–1.030)

## 2015-11-17 LAB — CBC WITH DIFFERENTIAL/PLATELET
BASOS ABS: 0 10*3/uL (ref 0.0–0.1)
Basophils Relative: 0 %
Eosinophils Absolute: 0.2 10*3/uL (ref 0.0–0.7)
Eosinophils Relative: 2 %
HEMATOCRIT: 49.3 % (ref 39.0–52.0)
HEMOGLOBIN: 16.5 g/dL (ref 13.0–17.0)
LYMPHS ABS: 2.4 10*3/uL (ref 0.7–4.0)
LYMPHS PCT: 24 %
MCH: 31.9 pg (ref 26.0–34.0)
MCHC: 33.5 g/dL (ref 30.0–36.0)
MCV: 95.2 fL (ref 78.0–100.0)
Monocytes Absolute: 1.2 10*3/uL — ABNORMAL HIGH (ref 0.1–1.0)
Monocytes Relative: 12 %
NEUTROS ABS: 6.2 10*3/uL (ref 1.7–7.7)
NEUTROS PCT: 62 %
Platelets: 224 10*3/uL (ref 150–400)
RBC: 5.18 MIL/uL (ref 4.22–5.81)
RDW: 13.5 % (ref 11.5–15.5)
WBC: 10 10*3/uL (ref 4.0–10.5)

## 2015-11-17 LAB — BASIC METABOLIC PANEL
ANION GAP: 7 (ref 5–15)
BUN: 9 mg/dL (ref 6–20)
CALCIUM: 9.2 mg/dL (ref 8.9–10.3)
CO2: 30 mmol/L (ref 22–32)
Chloride: 98 mmol/L — ABNORMAL LOW (ref 101–111)
Creatinine, Ser: 0.93 mg/dL (ref 0.61–1.24)
GFR calc Af Amer: >60 mL/min (ref >60)
GFR calc non Af Amer: >60 mL/min (ref >60)
GLUCOSE: 91 mg/dL (ref 65–99)
Potassium: 3.9 mmol/L (ref 3.5–5.1)
Sodium: 135 mmol/L (ref 135–145)

## 2015-11-17 LAB — SEDIMENTATION RATE: Sed Rate: 0 mm/hr (ref 0–16)

## 2015-11-17 LAB — C-REACTIVE PROTEIN: CRP: <0.5 mg/dL (ref <1.0)

## 2015-11-17 MED ORDER — LORAZEPAM 2 MG/ML IJ SOLN
1.0000 mg | Freq: Once | INTRAMUSCULAR | Status: AC
  Administered 2015-11-17: 1 mg via INTRAVENOUS
  Filled 2015-11-17: qty 1

## 2015-11-17 MED ORDER — GABAPENTIN 100 MG PO CAPS: 100.0000 mg | ORAL_CAPSULE | Freq: Three times a day (TID) | ORAL | Status: DC

## 2015-11-17 MED ORDER — HYDROMORPHONE HCL 1 MG/ML IJ SOLN
1.0000 mg | Freq: Once | INTRAMUSCULAR | Status: AC
  Administered 2015-11-17: 1 mg via INTRAVENOUS
  Filled 2015-11-17: qty 1

## 2015-11-17 MED ORDER — LORAZEPAM 2 MG/ML IJ SOLN
1.0000 mg | Freq: Once | INTRAMUSCULAR | Status: AC | PRN
  Administered 2015-11-17: 1 mg via INTRAVENOUS
  Filled 2015-11-17: qty 1

## 2015-11-17 MED ORDER — METHYLPREDNISOLONE 4 MG PO TBPK: ORAL_TABLET | Freq: Four times a day (QID) | ORAL | Status: DC

## 2015-11-17 MED ORDER — GADOBENATE DIMEGLUMINE 529 MG/ML IV SOLN
20.0000 mL | Freq: Once | INTRAVENOUS | Status: AC | PRN
  Administered 2015-11-17: 20 mL via INTRAVENOUS

## 2015-11-17 MED ORDER — DIAZEPAM 5 MG PO TABS
5.0000 mg | ORAL_TABLET | Freq: Once | ORAL | Status: AC
  Administered 2015-11-17: 5 mg via ORAL
  Filled 2015-11-17: qty 1

## 2015-11-17 MED ORDER — ONDANSETRON HCL 4 MG/2ML IJ SOLN
4.0000 mg | Freq: Once | INTRAMUSCULAR | Status: AC
  Administered 2015-11-17: 4 mg via INTRAVENOUS
  Filled 2015-11-17: qty 2

## 2015-11-17 NOTE — ED Notes (Signed)
Pt taken to MRI  

## 2015-11-17 NOTE — ED Notes (Signed)
PT's wife taken a meal at this time. PT is aware he is NPO until after his MRI has resulted. PT agrees

## 2015-11-17 NOTE — ED Provider Notes (Signed)
TIME SEEN: 6:20 AM  CHIEF COMPLAINT: Back pain, numbness  HPI: Pt is a 55 y.o. male with history of obesity, hypertension, hyperlipidemia, previous cervical spine surgery in 2010, chronic lower back pain who presents emergency department with worsening back pain for the past 3 days. Pain radiates down both of his legs which is something he had previously. He however states he now is having significant burning sensation and numbness in his feet that is new for him. States he is able to walk but does so with difficulty because of pain. He is not aware of any focal weakness. Denies any bowel or bladder incontinence, urinary retention. No fevers but states he has had hot and cold flashes. No history of diabetes, immune compromised state. No recent injury to his back. States his last MRI was approximately 2-3 years ago. States he is seeing Dr. Primus Bravo and is receiving epidural injections. His last was 1-1/2 months ago.  States that he currently takes Dilaudid daily and fentanyl and these are no longer helping his pain.  ROS: See HPI Constitutional: no fever  Eyes: no drainage  ENT: no runny nose   Cardiovascular:  no chest pain  Resp: no SOB  GI: no vomiting, diarrhea GU: no dysuria Integumentary: no rash  Allergy: no hives  Musculoskeletal: no leg swelling  Neurological: no slurred speech ROS otherwise negative  PAST MEDICAL HISTORY/PAST SURGICAL HISTORY:  Past Medical History  Diagnosis Date  . Hypertension   . Arthritis   . Gout   . Hyperlipidemia   . Neuromuscular disorder [facility])     MEDICATIONS:  Prior to Admission medications   Medication Sig Start Date End Date Taking? Authorizing Provider  ALPRAZolam Duanne Moron) 0.5 MG tablet Take one daily as needed for panic/anxiety. 10/18/15   Lonie Peak Dixon, PA-C  buPROPion (WELLBUTRIN XL) 300 MG 24 hr tablet Take 1 tablet (300 mg total) by mouth daily. Patient not taking: Reported on 10/31/2015 08/24/15   Orlena Sheldon, PA-C  cefUROXime (CEFTIN) 250 MG  tablet Take 1 tablet (250 mg total) by mouth 2 (two) times daily with a meal. 09/04/15   Mohammed Kindle, MD  cholecalciferol (VITAMIN D) 1000 UNITS tablet Take 1,000 Units by mouth.    Historical Provider, MD  fentaNYL (DURAGESIC - DOSED MCG/HR) 50 MCG/HR Apply 1 patch to skin every other day if tolerated 10/31/15   Mohammed Kindle, MD  FIBER PO Take 1 tablet by mouth 2 (two) times daily.    Historical Provider, MD  HYDROmorphone (DILAUDID) 4 MG tablet Limit one half to one tablet by mouth per day or twice per day if tolerated if tolerated for breakthrough pain while wearing fentanyl patch 10/31/15   Mohammed Kindle, MD  lisinopril (PRINIVIL,ZESTRIL) 40 MG tablet Take 1 tablet (40 mg total) by mouth daily. 03/22/15   Lonie Peak Dixon, PA-C  testosterone cypionate (DEPOTESTOSTERONE CYPIONATE) 200 MG/ML injection Inject 1 mL (200 mg total) into the muscle every 14 (fourteen) days. 10/27/15   Orlena Sheldon, PA-C    ALLERGIES:  Allergies  Allergen Reactions  . Flonase [Fluticasone] Swelling, Anaphylaxis and Rash  . Oxycodone Swelling  . Morphine And Related Itching and Rash    Rash developed after about 10 days of taking the tablets    SOCIAL HISTORY:  Social History  Substance Use Topics  . Smoking status: Former Smoker -- 0.50 packs/day for 8 years    Types: Cigarettes    Quit date: 04/28/1992  . Smokeless tobacco: Not on file  .  Alcohol Use: 1.2 oz/week    2 Glasses of wine per week    FAMILY HISTORY: Family History  Problem Relation Age of Onset  . Arthritis Mother   . Heart disease Mother   . Hyperlipidemia Mother   . Cancer Father   . Alcohol abuse Father     EXAM: BP 156/98 mmHg  Pulse 69  Temp(Src) 97.8 F (36.6 C) (Oral)  Resp 20  Ht 6' (1.829 m)  Wt 264 lb (119.75 kg)  BMI 35.80 kg/m2  SpO2 100% CONSTITUTIONAL: Alert and oriented and responds appropriately to questions. Obese, appears uncomfortable HEAD: Normocephalic EYES: Conjunctivae clear, PERRL ENT: normal nose; no  rhinorrhea; moist mucous membranes NECK: Supple, no meningismus, no LAD, no cervical spine tenderness or step-off or deformity  CARD: RRR; S1 and S2 appreciated; no murmurs, no clicks, no rubs, no gallops RESP: Normal chest excursion without splinting or tachypnea; breath sounds clear and equal bilaterally; no wheezes, no rhonchi, no rales, no hypoxia or respiratory distress, speaking full sentences ABD/GI: Normal bowel sounds; non-distended; soft, non-tender, no rebound, no guarding, no peritoneal signs BACK:  The back appears normal and is tender to palpation over the midline lumbar spine without step-off or deformity, there is no CVA tenderness EXT: Normal ROM in all joints; non-tender to palpation; no edema; normal capillary refill; no cyanosis, no calf tenderness or swelling    SKIN: Normal color for age and race; warm; no rash NEURO: Moves all extremities equally, sensation to light touch diminished in his bilateral feet but otherwise normal diffusely, +1 deep tendon reflexes in bilateral lower extremities, +2 deep tendon reflexes in bilateral upper x-rays, no clonus, decreased strength with hip flexion, hip extension, knee flexion, knee extension, dorsiflexion bilaterally, normal plantar flexion bilaterally; normal strength in bilateral upper extremities, cranial nerves II through XII intact, no saddle anesthesia PSYCH: The patient's mood and manner are appropriate. Grooming and personal hygiene are appropriate.  MEDICAL DECISION MAKING: Patient here with worsening lower back pain with new numbness and some focal weakness seen on his neurologic exam in his lower extremities with slightly diminished reflexes. Concern for possible cord compression. Given recent epidural injections with hot and cold flashes, also concerned for possible infection. We'll obtain an MRI for further evaluation. Will also obtain postvoid residual. We'll give IV Dilaudid for pain control.  ED PROGRESS: Patient's labs are  unremarkable.  PVR pending.  MRI L spine pending.  Signed out to Dr. Oleta Mouse.      Delice Bison Doak Mah, DO 11/19/15 [phone]

## 2015-11-17 NOTE — ED Provider Notes (Signed)
Please see previous physicians note regarding patient's presenting history and physical, initial ED course, and associated medical decision making. In short, this is a 55 year old male with history of chronic low back pain and prior cervical spine surgery with residual b/l hand numbness who presents with worsening low back pain and new numbness in bilateral feet with recent night sweats. Pending MRI lumbar spine at time of sign out.   Patient unable to be accommodated in [facility] MRI scanner. Spoke with MRI tech at [facility], and able to accommodate in larger machine. Transferred to [facility] for [facility]. Accepted by Dr. Shara Blazing, MD 11/17/15 [phone]

## 2015-11-17 NOTE — ED Notes (Signed)
Pt brought back from MRI, pt too large to fit in MRI machine. Dr. Leonides Schanz notified.

## 2015-11-17 NOTE — ED Notes (Signed)
Dr Ward at bedside,  

## 2015-11-17 NOTE — ED Notes (Signed)
MD at bedside. 
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
## 2015-11-17 NOTE — ED Notes (Signed)
Postvoid residual was 0 mL.

## 2015-11-17 NOTE — ED Notes (Signed)
Pt has fentanyl patch 50 mcg on right shoulder,

## 2015-11-17 NOTE — ED Provider Notes (Signed)
MRI is reviewed. Dr. Glenna Fellows, the patient's former neurosurgeon, was not available for telephone consult at this time. MRI was reviewed with Dr. Joya Salm. At this time, patient is safe to continue outpatient management with his pain management physician and to contact Dr. Carloyn Manner for follow-up. Patient is ambulatory with new symptom of worsening foot numbness and paresthesia-type pain. Patient is given a Medrol Dosepak and Neurontin for pain. He is chronically treated with fentanyl and Dilaudid for chronic pain component. Patient is alert and well. He is nontoxic and ambulatory.  Charlesetta Shanks, MD 11/17/15 1754

## 2015-11-17 NOTE — ED Notes (Signed)
Pt c/o lower back pain with radiation down both legs, states both hips hurt as well.   Pt reports this is a chronic condition and he has had some injections in his back for same.
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
## 2015-11-17 NOTE — Discharge Instructions (Signed)

## 2015-11-21 ENCOUNTER — Other Ambulatory Visit: Payer: Self-pay | Admitting: Physician Assistant

## 2015-11-21 DIAGNOSIS — F32A Depression, unspecified: Secondary | ICD-10-CM

## 2015-11-21 DIAGNOSIS — F329 Major depressive disorder, single episode, unspecified: Secondary | ICD-10-CM

## 2015-11-21 DIAGNOSIS — F419 Anxiety disorder, unspecified: Secondary | ICD-10-CM

## 2015-11-21 MED ORDER — ALPRAZOLAM 0.5 MG PO TABS: ORAL_TABLET | ORAL | Status: DC

## 2015-11-21 NOTE — Telephone Encounter (Signed)
Approved. #[id number]. 

## 2015-11-21 NOTE — Telephone Encounter (Signed)
LRF 10/18/15 #30  LOV 10/18/15  OK refill?

## 2015-11-21 NOTE — Telephone Encounter (Signed)
rx called in

## 2015-11-28 ENCOUNTER — Ambulatory Visit: Payer: PPO | Attending: Pain Medicine | Admitting: Pain Medicine

## 2015-11-28 ENCOUNTER — Encounter: Payer: Self-pay | Admitting: Pain Medicine

## 2015-11-28 VITALS — BP 153/96 | HR 93 | Temp 98.1°F | Resp 18 | Ht 72.0 in | Wt 264.0 lb

## 2015-11-28 DIAGNOSIS — Z981 Arthrodesis status: Secondary | ICD-10-CM

## 2015-11-28 DIAGNOSIS — M069 Rheumatoid arthritis, unspecified: Secondary | ICD-10-CM | POA: Diagnosis not present

## 2015-11-28 DIAGNOSIS — M47816 Spondylosis without myelopathy or radiculopathy, lumbar region: Secondary | ICD-10-CM

## 2015-11-28 DIAGNOSIS — M48062 Spinal stenosis, lumbar region with neurogenic claudication: Secondary | ICD-10-CM

## 2015-11-28 DIAGNOSIS — M4806 Spinal stenosis, lumbar region: Secondary | ICD-10-CM | POA: Diagnosis not present

## 2015-11-28 DIAGNOSIS — M4316 Spondylolisthesis, lumbar region: Secondary | ICD-10-CM | POA: Diagnosis not present

## 2015-11-28 DIAGNOSIS — M5124 Other intervertebral disc displacement, thoracic region: Secondary | ICD-10-CM | POA: Insufficient documentation

## 2015-11-28 DIAGNOSIS — M545 Low back pain: Secondary | ICD-10-CM | POA: Diagnosis not present

## 2015-11-28 DIAGNOSIS — M5030 Other cervical disc degeneration, unspecified cervical region: Secondary | ICD-10-CM | POA: Diagnosis not present

## 2015-11-28 DIAGNOSIS — M19012 Primary osteoarthritis, left shoulder: Secondary | ICD-10-CM

## 2015-11-28 DIAGNOSIS — M1990 Unspecified osteoarthritis, unspecified site: Secondary | ICD-10-CM | POA: Diagnosis not present

## 2015-11-28 DIAGNOSIS — M791 Myalgia: Secondary | ICD-10-CM | POA: Diagnosis not present

## 2015-11-28 DIAGNOSIS — M47814 Spondylosis without myelopathy or radiculopathy, thoracic region: Secondary | ICD-10-CM | POA: Insufficient documentation

## 2015-11-28 DIAGNOSIS — M5416 Radiculopathy, lumbar region: Secondary | ICD-10-CM | POA: Diagnosis not present

## 2015-11-28 DIAGNOSIS — M47817 Spondylosis without myelopathy or radiculopathy, lumbosacral region: Secondary | ICD-10-CM | POA: Diagnosis not present

## 2015-11-28 DIAGNOSIS — M5134 Other intervertebral disc degeneration, thoracic region: Secondary | ICD-10-CM | POA: Insufficient documentation

## 2015-11-28 DIAGNOSIS — M47812 Spondylosis without myelopathy or radiculopathy, cervical region: Secondary | ICD-10-CM

## 2015-11-28 DIAGNOSIS — M5126 Other intervertebral disc displacement, lumbar region: Secondary | ICD-10-CM | POA: Insufficient documentation

## 2015-11-28 DIAGNOSIS — M170 Bilateral primary osteoarthritis of knee: Secondary | ICD-10-CM

## 2015-11-28 DIAGNOSIS — M5136 Other intervertebral disc degeneration, lumbar region: Secondary | ICD-10-CM | POA: Diagnosis not present

## 2015-11-28 DIAGNOSIS — M19011 Primary osteoarthritis, right shoulder: Secondary | ICD-10-CM

## 2015-11-28 DIAGNOSIS — M533 Sacrococcygeal disorders, not elsewhere classified: Secondary | ICD-10-CM | POA: Diagnosis not present

## 2015-11-28 MED ORDER — HYDROMORPHONE HCL 4 MG PO TABS: ORAL_TABLET | ORAL | Status: DC

## 2015-11-28 MED ORDER — FENTANYL 50 MCG/HR TD PT72: MEDICATED_PATCH | TRANSDERMAL | Status: DC

## 2015-11-28 NOTE — Progress Notes (Signed)
Subjective:    Patient ID: Brent Hamilton, male    DOB: [birth year], 55 y.o.   MRN: [id number]  HPI  The patient is a 55 year old gentleman who returns to pain management for further evaluation and treatment of pain involving the region of the lower back and lower extremity region predominantly with pain occurring in the region of the upper extremities as well. The patient states that the lower back lower extremity pain is aggravated by standing walking and becomes more intense as the day progresses. The patient denies any definite trauma change in events of daily living to cause significant change in symptomatology. The patient also has significant pain involving the upper extremity regions. We discussed patient's condition and patient is without plans for surgical intervention. We've also advised patient to follow-up with rheumatologist in addition to his primary care physician for further evaluation and assessment of his condition. On today's visit we discussed patient's medications and have advised patient to decrease and discontinue the use of Xanax and to address this issue with his prescribing physician [name] . The patient stated that he could easily discontinue the Xanax and was with understanding of plan procedure lumbar epidural steroid injection and was in agreement to proceed with procedure at time of return appointment. We will continue Dilaudid and fentanyl patch as well. All agreed to suggested treatment plan       Review of Systems     Objective:   Physical Exam   There was tenderness to palpation of the cervical facet cervical paraspinal musculature region. Palpation of the splenius capitis and occipitalis regions reproduced pain of mild-to-moderate degree. The patient appeared to be with unremarkable Spurling's maneuver was tenderness to palpation of the acromioclavicular and glenohumeral joint regions a moderate degree. The patient appeared to be with slightly decreased  grip strength with Tinel and Phalen's maneuver reproducing minimal discomfort. There was tenderness over the region of the thoracic facet thoracic paraspinal musculature region without crepitus of the thoracic region noted. Palpation over the lumbar paraspinal musculatures and lumbar facet region was attends to palpation of moderate degree with lateral bending rotation extension and palpation over the lumbar facets reproducing moderately severe discomfort. Palpation of the PSIS and PII S region reproduced moderate discomfort. Straight leg raising was tolerates approximately 20 without an increase of pain with dorsiflexion noted. EHL strength appeared to be decreased. No definite sensory deficit of dermatomal distribution detected. DTRs appeared to be trace at the knees. There was negative clonus negative Homans. Abdomen was nontender with no costovertebral tenderness noted.       Assessment & Plan:    Degenerative disc disease lumbar spine  T12-L1, L1 L2 facet hypertrophy L2-3 facet hypertrophy L4-5 anterior listhesis at this level with mild disc bulging and ligamentous hypertrophy with moderate bilateral L4 foraminal stenosis, L5-S1 facet hypertrophy  Lumbar facet syndrome  Lumbar stenosis with neurogenic claudication  Degenerative disc disease thoracic spine T11-12 circumferential disc bulge T10-11 partially visible degenerative changes of the thoracic  Degenerative disc disease cervical spine  Degenerative joint disease  Rheumatoid arthritis      PLAN   Continue present medication Dilaudid and fentanyl patch Follow-up with rheumatologist to discuss medications for arthritis and medication for arthritic pain and continue present dose of Dilaudid and fentanyl patches as previously discussed  CAUTION   Need to decrease and discontinue the use of Xanax as discussed. Please discuss this with [name] as we discussed today   Lumbar epidural steroid injection to be performed  at time of  return appointment  F/U PCP Dr.Shah or [name]  for evaliation of  BP and general medical condition  Also remember to discuss decreasing Xanax and discontinuing the use of Xanax  F/U surgical evaluation. May consider  PNCV/EMG studies and other studies pending follow-up evaluations  F/U with your rheumatologist as we discussed  F/U neurological evaluation. May consider PNCV/EMG studies and other studies pending follow-up evaluations  May consider radiofrequency procedures and implantation devices and other treatment pending follow-up evaluations  Patient is to call pain management for concerns regarding condition prior to scheduled return appointment

## 2015-11-28 NOTE — Patient Instructions (Addendum)
PLAN   Continue present medication Dilaudid and fentanyl patch Follow-up with rheumatologist to discuss medications for arthritis and medication for arthritic pain and continue present dose of Dilaudid and fentanyl patches as previously discussed and CAUTION Needs to decrease and discontinue the use of Xanax as discussed. Please discussed this with Brent Hamilton as we discussed today   Lumbar epidural steroid injection to be performed at time of return appointment  F/U PCP Dr.Shah or Brent Hamilton  for evaliation of  BP and general medical condition  Also remember to discuss decreasing Xanax and discontinuing the use of Xanax  F/U surgical evaluation. May consider  PNCV/EMG studies and other studies pending follow-up evaluations  F/U with your rheumatologist as we discussed  F/U neurological evaluation. May consider PNCV/EMG studies and other studies pending follow-up evaluations  May consider radiofrequency procedures and implantation devices and other treatment pending follow-up evaluations  Patient is to call pain management for concerns regarding condition prior to scheduled return appointmentGENERAL RISKS AND COMPLICATIONS  What are the risk, side effects and possible complications? Generally speaking, most procedures are safe.  However, with any procedure there are risks, side effects, and the possibility of complications.  The risks and complications are dependent upon the sites that are lesioned, or the type of nerve block to be performed.  The closer the procedure is to the spine, the more serious the risks are.  Great care is taken when placing the radio frequency needles, block needles or lesioning probes, but sometimes complications can occur. 1. Infection: Any time there is an injection through the skin, there is a risk of infection.  This is why sterile conditions are used for these blocks.  There are four possible types of infection. 1. Localized skin infection. 2. Central Nervous System  Infection-This can be in the form of Meningitis, which can be deadly. 3. Epidural Infections-This can be in the form of an epidural abscess, which can cause pressure inside of the spine, causing compression of the spinal cord with subsequent paralysis. This would require an emergency surgery to decompress, and there are no guarantees that the patient would recover from the paralysis. 4. Discitis-This is an infection of the intervertebral discs.  It occurs in about 1% of discography procedures.  It is difficult to treat and it may lead to surgery.        2. Pain: the needles have to go through skin and soft tissues, will cause soreness.       3. Damage to internal structures:  The nerves to be lesioned may be near blood vessels or    other nerves which can be potentially damaged.       4. Bleeding: Bleeding is more common if the patient is taking blood thinners such as  aspirin, Coumadin, Ticiid, Plavix, etc., or if he/she have some genetic predisposition  such as hemophilia. Bleeding into the spinal canal can cause compression of the spinal  cord with subsequent paralysis.  This would require an emergency surgery to  decompress and there are no guarantees that the patient would recover from the  paralysis.       5. Pneumothorax:  Puncturing of a lung is a possibility, every time a needle is introduced in  the area of the chest or upper back.  Pneumothorax refers to free air around the  collapsed lung(s), inside of the thoracic cavity (chest cavity).  Another two possible  complications related to a similar event would include: Hemothorax and Chylothorax.   These  are variations of the Pneumothorax, where instead of air around the collapsed  lung(s), you may have blood or chyle, respectively.       6. Spinal headaches: They may occur with any procedures in the area of the spine.       7. Persistent CSF (Cerebro-Spinal Fluid) leakage: This is a rare problem, but may occur  with prolonged intrathecal or  epidural catheters either due to the formation of a fistulous  track or a dural tear.       8. Nerve damage: By working so close to the spinal cord, there is always a possibility of  nerve damage, which could be as serious as a permanent spinal cord injury with  paralysis.       9. Death:  Although rare, severe deadly allergic reactions known as "Anaphylactic  reaction" can occur to any of the medications used.      10. Worsening of the symptoms:  We can always make thing worse.  What are the chances of something like this happening? Chances of any of this occuring are extremely low.  By statistics, you have more of a chance of getting killed in a motor vehicle accident: while driving to the hospital than any of the above occurring .  Nevertheless, you should be aware that they are possibilities.  In general, it is similar to taking a shower.  Everybody knows that you can slip, hit your head and get killed.  Does that mean that you should not shower again?  Nevertheless always keep in mind that statistics do not mean anything if you happen to be on the wrong side of them.  Even if a procedure has a 1 (one) in a 1,000,000 (million) chance of going wrong, it you happen to be that one..Also, keep in mind that by statistics, you have more of a chance of having something go wrong when taking medications.  Who should not have this procedure? If you are on a blood thinning medication (e.g. Coumadin, Plavix, see list of "Blood Thinners"), or if you have an active infection going on, you should not have the procedure.  If you are taking any blood thinners, please inform your physician.  How should I prepare for this procedure?  Do not eat or drink anything at least six hours prior to the procedure.  Bring a driver with you .  It cannot be a taxi.  Come accompanied by an adult that can drive you back, and that is strong enough to help you if your legs get weak or numb from the local anesthetic.  Take all of  your medicines the morning of the procedure with just enough water to swallow them.  If you have diabetes, make sure that you are scheduled to have your procedure done first thing in the morning, whenever possible.  If you have diabetes, take only half of your insulin dose and notify our nurse that you have done so as soon as you arrive at the clinic.  If you are diabetic, but only take blood sugar pills (oral hypoglycemic), then do not take them on the morning of your procedure.  You may take them after you have had the procedure.  Do not take aspirin or any aspirin-containing medications, at least eleven (11) days prior to the procedure.  They may prolong bleeding.  Wear loose fitting clothing that may be easy to take off and that you would not mind if it got stained with Betadine or blood.  Do  not wear any jewelry or perfume  Remove any nail coloring.  It will interfere with some of our monitoring equipment.  NOTE: Remember that this is not meant to be interpreted as a complete list of all possible complications.  Unforeseen problems may occur.  BLOOD THINNERS The following drugs contain aspirin or other products, which can cause increased bleeding during surgery and should not be taken for 2 weeks prior to and 1 week after surgery.  If you should need take something for relief of minor pain, you may take acetaminophen which is found in Tylenol,m Datril, Anacin-3 and Panadol. It is not blood thinner. The products listed below are.  Do not take any of the products listed below in addition to any listed on your instruction sheet.  A.P.C or A.P.C with Codeine Codeine Phosphate Capsules #3 Ibuprofen Ridaura  ABC compound Congesprin Imuran rimadil  Advil Cope Indocin Robaxisal  Alka-Seltzer Effervescent Pain Reliever and Antacid Coricidin or Coricidin-D  Indomethacin Rufen  Alka-Seltzer plus Cold Medicine Cosprin Ketoprofen S-A-C Tablets  Anacin Analgesic Tablets or Capsules Coumadin  Korlgesic Salflex  Anacin Extra Strength Analgesic tablets or capsules CP-2 Tablets Lanoril Salicylate  Anaprox Cuprimine Capsules Levenox Salocol  Anexsia-D Dalteparin Magan Salsalate  Anodynos Darvon compound Magnesium Salicylate Sine-off  Ansaid Dasin Capsules Magsal Sodium Salicylate  Anturane Depen Capsules Marnal Soma  APF Arthritis pain formula Dewitt's Pills Measurin Stanback  Argesic Dia-Gesic Meclofenamic Sulfinpyrazone  Arthritis Bayer Timed Release Aspirin Diclofenac Meclomen Sulindac  Arthritis pain formula Anacin Dicumarol Medipren Supac  Analgesic (Safety coated) Arthralgen Diffunasal Mefanamic Suprofen  Arthritis Strength Bufferin Dihydrocodeine Mepro Compound Suprol  Arthropan liquid Dopirydamole Methcarbomol with Aspirin Synalgos  ASA tablets/Enseals Disalcid Micrainin Tagament  Ascriptin Doan's Midol Talwin  Ascriptin A/D Dolene Mobidin Tanderil  Ascriptin Extra Strength Dolobid Moblgesic Ticlid  Ascriptin with Codeine Doloprin or Doloprin with Codeine Momentum Tolectin  Asperbuf Duoprin Mono-gesic Trendar  Aspergum Duradyne Motrin or Motrin IB Triminicin  Aspirin plain, buffered or enteric coated Durasal Myochrisine Trigesic  Aspirin Suppositories Easprin Nalfon Trillsate  Aspirin with Codeine Ecotrin Regular or Extra Strength Naprosyn Uracel  Atromid-S Efficin Naproxen Ursinus  Auranofin Capsules Elmiron Neocylate Vanquish  Axotal Emagrin Norgesic Verin  Azathioprine Empirin or Empirin with Codeine Normiflo Vitamin E  Azolid Emprazil Nuprin Voltaren  Bayer Aspirin plain, buffered or children's or timed BC Tablets or powders Encaprin Orgaran Warfarin Sodium  Buff-a-Comp Enoxaparin Orudis Zorpin  Buff-a-Comp with Codeine Equegesic Os-Cal-Gesic   Buffaprin Excedrin plain, buffered or Extra Strength Oxalid   Bufferin Arthritis Strength Feldene Oxphenbutazone   Bufferin plain or Extra Strength Feldene Capsules Oxycodone with Aspirin   Bufferin with Codeine  Fenoprofen Fenoprofen Pabalate or Pabalate-SF   Buffets II Flogesic Panagesic   Buffinol plain or Extra Strength Florinal or Florinal with Codeine Panwarfarin   Buf-Tabs Flurbiprofen Penicillamine   Butalbital Compound Four-way cold tablets Penicillin   Butazolidin Fragmin Pepto-Bismol   Carbenicillin Geminisyn Percodan   Carna Arthritis Reliever Geopen Persantine   Carprofen Gold's salt Persistin   Chloramphenicol Goody's Phenylbutazone   Chloromycetin Haltrain Piroxlcam   Clmetidine heparin Plaquenil   Cllnoril Hyco-pap Ponstel   Clofibrate Hydroxy chloroquine Propoxyphen         Before stopping any of these medications, be sure to consult the physician who ordered them.  Some, such as Coumadin (Warfarin) are ordered to prevent or treat serious conditions such as "deep thrombosis", "pumonary embolisms", and other heart problems.  The amount of time that you may need off of the medication  may also vary with the medication and the reason for which you were taking it.  If you are taking any of these medications, please make sure you notify your pain physician before you undergo any procedures.         Epidural Steroid Injection An epidural steroid injection is given to relieve pain in your neck, back, or legs that is caused by the irritation or swelling of a nerve root. This procedure involves injecting a steroid and numbing medicine (anesthetic) into the epidural space. The epidural space is the space between the outer covering of your spinal cord and the bones that form your backbone (vertebra).  LET [facility] CARE PROVIDER KNOW ABOUT:  2. Any allergies you have. 3. All medicines you are taking, including vitamins, herbs, eye drops, creams, and over-the-counter medicines such as aspirin. 4. Previous problems you or members of your family have had with the use of anesthetics. 5. Any blood disorders or blood clotting disorders you have. 6. Previous surgeries you have had. 7. Medical  conditions you have. RISKS AND COMPLICATIONS Generally, this is a safe procedure. However, as with any procedure, complications can occur. Possible complications of epidural steroid injection include:  Headache.  Bleeding.  Infection.  Allergic reaction to the medicines.  Damage to your nerves. The response to this procedure depends on the underlying cause of the pain and its duration. People who have long-term (chronic) pain are less likely to benefit from epidural steroids than are those people whose pain comes on strong and suddenly. BEFORE THE PROCEDURE   Ask your health care provider about changing or stopping your regular medicines. You may be advised to stop taking blood-thinning medicines a few days before the procedure.  You may be given medicines to reduce anxiety.  Arrange for someone to take you home after the procedure. PROCEDURE   You will remain awake during the procedure. You may receive medicine to make you relaxed.  You will be asked to lie on your stomach.  The injection site will be cleaned.  The injection site will be numbed with a medicine (local anesthetic).  A needle will be injected through your skin into the epidural space.  Your health care provider will use an X-ray machine to ensure that the steroid is delivered closest to the affected nerve. You may have minimal discomfort at this time.  Once the needle is in the right position, the local anesthetic and the steroid will be injected into the epidural space.  The needle will then be removed and a bandage will be applied to the injection site. AFTER THE PROCEDURE  12. You may be monitored for a short time before you go home. 13. You may feel weakness or numbness in your arm or leg, which disappears within hours. 14. You may be allowed to eat, drink, and take your regular medicine. 15. You may have soreness at the site of the injection.   This information is not intended to replace advice given to  you by your health care provider. Make sure you discuss any questions you have with your health care provider.   Document Released: 09/24/2007 Document Revised: 02/17/2013 Document Reviewed: 12/04/2012 Elsevier Interactive Patient Education [employer].

## 2015-11-28 NOTE — Progress Notes (Signed)
Safety precautions to be maintained throughout the outpatient stay will include: orient to surroundings, keep bed in low position, maintain call bell within reach at all times, provide assistance with transfer out of bed and ambulation.  Patient went to ED for increased pain on 11-17-2015

## 2015-11-29 ENCOUNTER — Encounter: Payer: Self-pay | Admitting: Pain Medicine

## 2015-11-29 ENCOUNTER — Encounter: Payer: Self-pay | Admitting: Physician Assistant

## 2015-11-30 NOTE — Telephone Encounter (Signed)
Xanax removed from med list.  Has been discontinued by pain management provider.

## 2015-12-06 ENCOUNTER — Encounter: Payer: Self-pay | Admitting: Pain Medicine

## 2015-12-06 ENCOUNTER — Ambulatory Visit: Payer: PPO | Attending: Pain Medicine | Admitting: Pain Medicine

## 2015-12-06 VITALS — BP 142/84 | HR 86 | Temp 97.8°F | Resp 16 | Ht 73.0 in | Wt 264.0 lb

## 2015-12-06 DIAGNOSIS — M48062 Spinal stenosis, lumbar region with neurogenic claudication: Secondary | ICD-10-CM

## 2015-12-06 DIAGNOSIS — M5136 Other intervertebral disc degeneration, lumbar region: Secondary | ICD-10-CM | POA: Diagnosis not present

## 2015-12-06 DIAGNOSIS — M5030 Other cervical disc degeneration, unspecified cervical region: Secondary | ICD-10-CM

## 2015-12-06 DIAGNOSIS — M47816 Spondylosis without myelopathy or radiculopathy, lumbar region: Secondary | ICD-10-CM

## 2015-12-06 DIAGNOSIS — M5416 Radiculopathy, lumbar region: Secondary | ICD-10-CM | POA: Diagnosis not present

## 2015-12-06 DIAGNOSIS — M19011 Primary osteoarthritis, right shoulder: Secondary | ICD-10-CM

## 2015-12-06 DIAGNOSIS — M4316 Spondylolisthesis, lumbar region: Secondary | ICD-10-CM | POA: Insufficient documentation

## 2015-12-06 DIAGNOSIS — M533 Sacrococcygeal disorders, not elsewhere classified: Secondary | ICD-10-CM

## 2015-12-06 DIAGNOSIS — M5126 Other intervertebral disc displacement, lumbar region: Secondary | ICD-10-CM | POA: Diagnosis not present

## 2015-12-06 DIAGNOSIS — Z981 Arthrodesis status: Secondary | ICD-10-CM

## 2015-12-06 DIAGNOSIS — M51369 Other intervertebral disc degeneration, lumbar region without mention of lumbar back pain or lower extremity pain: Secondary | ICD-10-CM

## 2015-12-06 DIAGNOSIS — M19012 Primary osteoarthritis, left shoulder: Secondary | ICD-10-CM

## 2015-12-06 DIAGNOSIS — M170 Bilateral primary osteoarthritis of knee: Secondary | ICD-10-CM

## 2015-12-06 DIAGNOSIS — M47812 Spondylosis without myelopathy or radiculopathy, cervical region: Secondary | ICD-10-CM

## 2015-12-06 MED ORDER — FENTANYL CITRATE (PF) 100 MCG/2ML IJ SOLN
100.0000 ug | Freq: Once | INTRAMUSCULAR | Status: AC
  Administered 2015-12-06: 100 ug via INTRAVENOUS
  Filled 2015-12-06: qty 2

## 2015-12-06 MED ORDER — MIDAZOLAM HCL 5 MG/5ML IJ SOLN
5.0000 mg | Freq: Once | INTRAMUSCULAR | Status: AC
  Administered 2015-12-06: 5 mg via INTRAVENOUS
  Filled 2015-12-06: qty 5

## 2015-12-06 MED ORDER — LIDOCAINE HCL (PF) 1 % IJ SOLN
10.0000 mL | Freq: Once | INTRAMUSCULAR | Status: AC
  Administered 2015-12-06: 10 mL via SUBCUTANEOUS
  Filled 2015-12-06: qty 10

## 2015-12-06 MED ORDER — SODIUM CHLORIDE 0.9% FLUSH
20.0000 mL | Freq: Once | INTRAVENOUS | Status: AC
  Administered 2015-12-06: 20 mL

## 2015-12-06 MED ORDER — TRIAMCINOLONE ACETONIDE 40 MG/ML IJ SUSP
40.0000 mg | Freq: Once | INTRAMUSCULAR | Status: AC
  Administered 2015-12-06: 40 mg
  Filled 2015-12-06: qty 1

## 2015-12-06 MED ORDER — LACTATED RINGERS IV SOLN: 1000.0000 mL | INTRAVENOUS | Status: AC

## 2015-12-06 MED ORDER — CEFUROXIME AXETIL 250 MG PO TABS: 250.0000 mg | ORAL_TABLET | Freq: Two times a day (BID) | ORAL | Status: DC

## 2015-12-06 MED ORDER — CEFAZOLIN SODIUM 1-5 GM-% IV SOLN
1.0000 g | Freq: Once | INTRAVENOUS | Status: AC
  Administered 2015-12-06: 1 g via INTRAVENOUS

## 2015-12-06 MED ORDER — ORPHENADRINE CITRATE 30 MG/ML IJ SOLN
60.0000 mg | Freq: Once | INTRAMUSCULAR | Status: AC
  Administered 2015-12-06: 60 mg via INTRAMUSCULAR
  Filled 2015-12-06: qty 2

## 2015-12-06 MED ORDER — BUPIVACAINE HCL (PF) 0.25 % IJ SOLN
30.0000 mL | Freq: Once | INTRAMUSCULAR | Status: AC
  Administered 2015-12-06: 30 mL
  Filled 2015-12-06: qty 30

## 2015-12-06 MED ORDER — CEFAZOLIN SODIUM 1 G IJ SOLR
INTRAMUSCULAR | Status: AC
  Administered 2015-12-06: 08:00:00
  Filled 2015-12-06: qty 10

## 2015-12-06 NOTE — Progress Notes (Signed)
   Subjective:    Patient ID: Rockney Ghee, male    DOB: [birth year], 55 y.o.   MRN: [id number]  HPI  PROCEDURE PERFORMED: Lumbar epidural steroid injection   NOTE: The patient is a 55 y.o. male who returns to [place] for further evaluation and treatment of pain involving the lumbar and lower extremity region. MRI revealed the patient to be with degenerative disc disease lumbar spine  T12-L1, L1 L2 facet hypertrophy L2-3 facet hypertrophy L4-5 anterior listhesis at this level with mild disc bulging and ligamentous hypertrophy with moderate bilateral L4 foraminal stenosis, L5-S1 facet hypertrophy. There is concern regarding component of pain due to lumbar stenosis with neurogenic claudication in addition to other abnormalities of the lumbosacral spine The risks, benefits, and expectations of the procedure have been discussed and explained to the patient who was understanding and in agreement with suggested treatment plan. We will proceed with lumbar epidural steroid injection as discussed and as explained to the patient who is willing to proceed with procedure as planned.   DESCRIPTION OF PROCEDURE: Lumbar epidural steroid injection with IV Versed, IV fentanyl conscious sedation, EKG, blood pressure, pulse, capnography, and pulse oximetry monitoring. The procedure was performed with the patient in the prone position under fluoroscopic guidance. A local anesthetic skin wheal of 1.5% plain lidocaine was accomplished at proposed entry site. An 18-gauge Tuohy epidural needle was inserted at the L 4 vertebral body level right of the midline via loss-of-resistance technique with negative heme and negative CSF return. A total of 4 mL of Preservative-Free normal saline with 40 mg of Kenalog injected incrementally via epidurally placed needle. Needle was removed.  Myoneural block injections of the lumbar paraspinal musculature region Following Betadine prep of proposed entry site a 22-gauge needle  was inserted into the lumbar paraspinal musculature region and following negative aspiration 4 cc of 0.25% bupivacaine with Norflex was injected for myoneural block injection of the lumbar paraspinal musculature region 4    A total of 40 mg of Kenalog was utilized for the procedure.   The patient tolerated the injection well.    PLAN:   1. Medications: We will continue presently prescribed medications Dilaudid and fentanyl patch 2. Will consider modification of treatment regimen pending response to treatment rendered on today's visit and follow-up evaluation. 3. The patient is to follow-up with primary care physician Primitivo Gauze regarding blood pressure and general medical condition status post lumbar epidural steroid injection performed on today's visit. 4. Surgical evaluation. Has been addressed 5. Neurological evaluation. May consider PNCV EMG studies and other studies 6. The patient may be a candidate for radiofrequency procedures, implantation device, and other treatment pending response to treatment and follow-up evaluation. 7. The patient has been advised to adhere to proper body mechanics and avoid activities which appear to aggravate condition. 8. The patient has been advised to call the [facility] prior to scheduled return appointment should there be significant change in condition or should there be sign  The patient is understanding and agrees with the suggested  treatment plan   Review of Systems     Objective:   Physical Exam        Assessment & Plan:

## 2015-12-06 NOTE — Progress Notes (Signed)
Safety precautions to be maintained throughout the outpatient stay will include: orient to surroundings, keep bed in low position, maintain call bell within reach at all times, provide assistance with transfer out of bed and ambulation.  

## 2015-12-06 NOTE — Patient Instructions (Addendum)
PLAN  Continue present medication Dilaudid and fentanyl patch and Begin taking antibiotic Ceftin as prescribed. Please obtain your antibiotic today and begin taking  antibiotic today as prescribed  F/U PCP  Brent Hamilton for evaliation of  BP and general medical  condition.  F/U surgical evaluation. May consider pending follow-up evaluations  F/U neurological evaluation. May consider PNCB/EMG studies and other studies pending follow-up evaluations  F/U rheumatological evaluation as discussed  May consider radiofrequency rhizolysis or intraspinal procedures pending response to present treatment and F/U evaluation.  Patient to call [facility] should patient have concerns prior to scheduled return appointmentPain Management Discharge Instructions  General Discharge Instructions :  If you need to reach your doctor call: Monday-Friday 8:00 am - 4:00 pm at [phone] or toll free [phone].  After clinic hours [phone] to have operator reach doctor.  Bring all of your medication bottles to all your appointments in the pain clinic.  To cancel or reschedule your appointment with Pain Management please remember to call 24 hours in advance to avoid a fee.  Refer to the educational materials which you have been given on: General Risks, I had my Procedure. Discharge Instructions, Post Sedation.  Post Procedure Instructions:  The drugs you were given will stay in your system until tomorrow, so for the next 24 hours you should not drive, make any legal decisions or drink any alcoholic beverages.  You may eat anything you prefer, but it is better to start with liquids then soups and crackers, and gradually work up to solid foods.  Please notify your doctor immediately if you have any unusual bleeding, trouble breathing or pain that is not related to your normal pain.  Depending on the type of procedure that was done, some parts of your body may feel week and/or numb.  This usually  clears up by tonight or the next day.  Walk with the use of an assistive device or accompanied by an adult for the 24 hours.  You may use ice on the affected area for the first 24 hours.  Put ice in a Ziploc bag and cover with a towel and place against area 15 minutes on 15 minutes off.  You may switch to heat after 24 hours.GENERAL RISKS AND COMPLICATIONS  What are the risk, side effects and possible complications? Generally speaking, most procedures are safe.  However, with any procedure there are risks, side effects, and the possibility of complications.  The risks and complications are dependent upon the sites that are lesioned, or the type of nerve block to be performed.  The closer the procedure is to the spine, the more serious the risks are.  Great care is taken when placing the radio frequency needles, block needles or lesioning probes, but sometimes complications can occur. 1. Infection: Any time there is an injection through the skin, there is a risk of infection.  This is why sterile conditions are used for these blocks.  There are four possible types of infection. 1. Localized skin infection. 2. Central Nervous System Infection-This can be in the form of Meningitis, which can be deadly. 3. Epidural Infections-This can be in the form of an epidural abscess, which can cause pressure inside of the spine, causing compression of the spinal cord with subsequent paralysis. This would require an emergency surgery to decompress, and there are no guarantees that the patient would recover from the paralysis. 4. Discitis-This is an infection of the intervertebral discs.  It occurs in about 1% of discography  procedures.  It is difficult to treat and it may lead to surgery.        2. Pain: the needles have to go through skin and soft tissues, will cause soreness.       3. Damage to internal structures:  The nerves to be lesioned may be near blood vessels or    other nerves which can be potentially  damaged.       4. Bleeding: Bleeding is more common if the patient is taking blood thinners such as  aspirin, Coumadin, Ticiid, Plavix, etc., or if he/she have some genetic predisposition  such as hemophilia. Bleeding into the spinal canal can cause compression of the spinal  cord with subsequent paralysis.  This would require an emergency surgery to  decompress and there are no guarantees that the patient would recover from the  paralysis.       5. Pneumothorax:  Puncturing of a lung is a possibility, every time a needle is introduced in  the area of the chest or upper back.  Pneumothorax refers to free air around the  collapsed lung(s), inside of the thoracic cavity (chest cavity).  Another two possible  complications related to a similar event would include: Hemothorax and Chylothorax.   These are variations of the Pneumothorax, where instead of air around the collapsed  lung(s), you may have blood or chyle, respectively.       6. Spinal headaches: They may occur with any procedures in the area of the spine.       7. Persistent CSF (Cerebro-Spinal Fluid) leakage: This is a rare problem, but may occur  with prolonged intrathecal or epidural catheters either due to the formation of a fistulous  track or a dural tear.       8. Nerve damage: By working so close to the spinal cord, there is always a possibility of  nerve damage, which could be as serious as a permanent spinal cord injury with  paralysis.       9. Death:  Although rare, severe deadly allergic reactions known as "Anaphylactic  reaction" can occur to any of the medications used.      10. Worsening of the symptoms:  We can always make thing worse.  What are the chances of something like this happening? Chances of any of this occuring are extremely low.  By statistics, you have more of a chance of getting killed in a motor vehicle accident: while driving to the hospital than any of the above occurring .  Nevertheless, you should be aware that  they are possibilities.  In general, it is similar to taking a shower.  Everybody knows that you can slip, hit your head and get killed.  Does that mean that you should not shower again?  Nevertheless always keep in mind that statistics do not mean anything if you happen to be on the wrong side of them.  Even if a procedure has a 1 (one) in a 1,000,000 (million) chance of going wrong, it you happen to be that one..Also, keep in mind that by statistics, you have more of a chance of having something go wrong when taking medications.  Who should not have this procedure? If you are on a blood thinning medication (e.g. Coumadin, Plavix, see list of "Blood Thinners"), or if you have an active infection going on, you should not have the procedure.  If you are taking any blood thinners, please inform your physician.  How should I prepare for this  procedure?  Do not eat or drink anything at least six hours prior to the procedure.  Bring a driver with you .  It cannot be a taxi.  Come accompanied by an adult that can drive you back, and that is strong enough to help you if your legs get weak or numb from the local anesthetic.  Take all of your medicines the morning of the procedure with just enough water to swallow them.  If you have diabetes, make sure that you are scheduled to have your procedure done first thing in the morning, whenever possible.  If you have diabetes, take only half of your insulin dose and notify our nurse that you have done so as soon as you arrive at the clinic.  If you are diabetic, but only take blood sugar pills (oral hypoglycemic), then do not take them on the morning of your procedure.  You may take them after you have had the procedure.  Do not take aspirin or any aspirin-containing medications, at least eleven (11) days prior to the procedure.  They may prolong bleeding.  Wear loose fitting clothing that may be easy to take off and that you would not mind if it got stained  with Betadine or blood.  Do not wear any jewelry or perfume  Remove any nail coloring.  It will interfere with some of our monitoring equipment.  NOTE: Remember that this is not meant to be interpreted as a complete list of all possible complications.  Unforeseen problems may occur.  BLOOD THINNERS The following drugs contain aspirin or other products, which can cause increased bleeding during surgery and should not be taken for 2 weeks prior to and 1 week after surgery.  If you should need take something for relief of minor pain, you may take acetaminophen which is found in Tylenol,m Datril, Anacin-3 and Panadol. It is not blood thinner. The products listed below are.  Do not take any of the products listed below in addition to any listed on your instruction sheet.  A.P.C or A.P.C with Codeine Codeine Phosphate Capsules #3 Ibuprofen Ridaura  ABC compound Congesprin Imuran rimadil  Advil Cope Indocin Robaxisal  Alka-Seltzer Effervescent Pain Reliever and Antacid Coricidin or Coricidin-D  Indomethacin Rufen  Alka-Seltzer plus Cold Medicine Cosprin Ketoprofen S-A-C Tablets  Anacin Analgesic Tablets or Capsules Coumadin Korlgesic Salflex  Anacin Extra Strength Analgesic tablets or capsules CP-2 Tablets Lanoril Salicylate  Anaprox Cuprimine Capsules Levenox Salocol  Anexsia-D Dalteparin Magan Salsalate  Anodynos Darvon compound Magnesium Salicylate Sine-off  Ansaid Dasin Capsules Magsal Sodium Salicylate  Anturane Depen Capsules Marnal Soma  APF Arthritis pain formula Dewitt's Pills Measurin Stanback  Argesic Dia-Gesic Meclofenamic Sulfinpyrazone  Arthritis Bayer Timed Release Aspirin Diclofenac Meclomen Sulindac  Arthritis pain formula Anacin Dicumarol Medipren Supac  Analgesic (Safety coated) Arthralgen Diffunasal Mefanamic Suprofen  Arthritis Strength Bufferin Dihydrocodeine Mepro Compound Suprol  Arthropan liquid Dopirydamole Methcarbomol with Aspirin Synalgos  ASA tablets/Enseals  Disalcid Micrainin Tagament  Ascriptin Doan's Midol Talwin  Ascriptin A/D Dolene Mobidin Tanderil  Ascriptin Extra Strength Dolobid Moblgesic Ticlid  Ascriptin with Codeine Doloprin or Doloprin with Codeine Momentum Tolectin  Asperbuf Duoprin Mono-gesic Trendar  Aspergum Duradyne Motrin or Motrin IB Triminicin  Aspirin plain, buffered or enteric coated Durasal Myochrisine Trigesic  Aspirin Suppositories Easprin Nalfon Trillsate  Aspirin with Codeine Ecotrin Regular or Extra Strength Naprosyn Uracel  Atromid-S Efficin Naproxen Ursinus  Auranofin Capsules Elmiron Neocylate Vanquish  Axotal Emagrin Norgesic Verin  Azathioprine Empirin or Empirin with Codeine Normiflo  Vitamin E  Azolid Emprazil Nuprin Voltaren  Bayer Aspirin plain, buffered or children's or timed BC Tablets or powders Encaprin Orgaran Warfarin Sodium  Buff-a-Comp Enoxaparin Orudis Zorpin  Buff-a-Comp with Codeine Equegesic Os-Cal-Gesic   Buffaprin Excedrin plain, buffered or Extra Strength Oxalid   Bufferin Arthritis Strength Feldene Oxphenbutazone   Bufferin plain or Extra Strength Feldene Capsules Oxycodone with Aspirin   Bufferin with Codeine Fenoprofen Fenoprofen Pabalate or Pabalate-SF   Buffets II Flogesic Panagesic   Buffinol plain or Extra Strength Florinal or Florinal with Codeine Panwarfarin   Buf-Tabs Flurbiprofen Penicillamine   Butalbital Compound Four-way cold tablets Penicillin   Butazolidin Fragmin Pepto-Bismol   Carbenicillin Geminisyn Percodan   Carna Arthritis Reliever Geopen Persantine   Carprofen Gold's salt Persistin   Chloramphenicol Goody's Phenylbutazone   Chloromycetin Haltrain Piroxlcam   Clmetidine heparin Plaquenil   Cllnoril Hyco-pap Ponstel   Clofibrate Hydroxy chloroquine Propoxyphen         Before stopping any of these medications, be sure to consult the physician who ordered them.  Some, such as Coumadin (Warfarin) are ordered to prevent or treat serious conditions such as "deep  thrombosis", "pumonary embolisms", and other heart problems.  The amount of time that you may need off of the medication may also vary with the medication and the reason for which you were taking it.  If you are taking any of these medications, please make sure you notify your pain physician before you undergo any procedures.

## 2015-12-07 ENCOUNTER — Telehealth: Payer: Self-pay | Admitting: *Deleted

## 2015-12-07 NOTE — Telephone Encounter (Signed)
Left voicemail for patient to call our office if there are questions or concerns re; procedure on yesteday.

## 2015-12-20 ENCOUNTER — Other Ambulatory Visit: Payer: Self-pay | Admitting: Physician Assistant

## 2015-12-20 ENCOUNTER — Encounter: Payer: Self-pay | Admitting: Physician Assistant

## 2015-12-20 ENCOUNTER — Ambulatory Visit (INDEPENDENT_AMBULATORY_CARE_PROVIDER_SITE_OTHER): Payer: PPO | Admitting: Physician Assistant

## 2015-12-20 VITALS — BP 150/90 | HR 90 | Temp 97.8°F | Resp 16 | Ht 73.0 in | Wt 262.0 lb

## 2015-12-20 DIAGNOSIS — R61 Generalized hyperhidrosis: Secondary | ICD-10-CM

## 2015-12-20 DIAGNOSIS — IMO0001 Reserved for inherently not codable concepts without codable children: Secondary | ICD-10-CM

## 2015-12-20 DIAGNOSIS — R300 Dysuria: Secondary | ICD-10-CM

## 2015-12-20 DIAGNOSIS — R509 Fever, unspecified: Secondary | ICD-10-CM | POA: Diagnosis not present

## 2015-12-20 DIAGNOSIS — R110 Nausea: Secondary | ICD-10-CM

## 2015-12-20 DIAGNOSIS — R231 Pallor: Secondary | ICD-10-CM | POA: Diagnosis not present

## 2015-12-20 LAB — CBC WITH DIFFERENTIAL/PLATELET
Basophils Absolute: 0 cells/uL (ref 0–200)
Basophils Relative: 0 %
EOS PCT: 0 %
Eosinophils Absolute: 0 cells/uL — ABNORMAL LOW (ref 15–500)
HCT: 50 % (ref 38.5–50.0)
HEMOGLOBIN: 17.3 g/dL — AB (ref 13.0–17.0)
LYMPHS ABS: 1608 {cells}/uL (ref 850–3900)
Lymphocytes Relative: 12 %
MCH: 32.5 pg (ref 27.0–33.0)
MCHC: 34.6 g/dL (ref 32.0–36.0)
MCV: 93.8 fL (ref 80.0–100.0)
MONO ABS: 1340 {cells}/uL — AB (ref 200–950)
MONOS PCT: 10 %
MPV: 9.8 fL (ref 7.5–12.5)
NEUTROS PCT: 78 %
Neutro Abs: 10452 cells/uL — ABNORMAL HIGH (ref 1500–7800)
PLATELETS: 248 10*3/uL (ref 140–400)
RBC: 5.33 MIL/uL (ref 4.20–5.80)
RDW: 13.9 % (ref 11.0–15.0)
WBC: 13.4 10*3/uL — ABNORMAL HIGH (ref 3.8–10.8)

## 2015-12-20 LAB — URINALYSIS, ROUTINE W REFLEX MICROSCOPIC
BILIRUBIN URINE: NEGATIVE
GLUCOSE, UA: NEGATIVE
Hgb urine dipstick: NEGATIVE
Ketones, ur: NEGATIVE
Leukocytes, UA: NEGATIVE
Nitrite: NEGATIVE
PROTEIN: NEGATIVE
Specific Gravity, Urine: 1.015 (ref 1.001–1.035)
pH: 7.5 (ref 5.0–8.0)

## 2015-12-21 LAB — URINE CULTURE

## 2015-12-21 LAB — COMPLETE METABOLIC PANEL WITH GFR
ALBUMIN: 4.2 g/dL (ref 3.6–5.1)
ALT: 38 U/L (ref 9–46)
AST: 29 U/L (ref 10–35)
Alkaline Phosphatase: 76 U/L (ref 40–115)
BUN: 12 mg/dL (ref 7–25)
CHLORIDE: 97 mmol/L — AB (ref 98–110)
CO2: 22 mmol/L (ref 20–31)
Calcium: 9.7 mg/dL (ref 8.6–10.3)
Creat: 1.12 mg/dL (ref 0.70–1.33)
GFR, Est African American: 84 mL/min (ref >=60)
GFR, Est Non African American: 73 mL/min (ref >=60)
GLUCOSE: 80 mg/dL (ref 70–99)
POTASSIUM: 5.2 mmol/L (ref 3.5–5.3)
SODIUM: 138 mmol/L (ref 135–146)
Total Bilirubin: 0.9 mg/dL (ref 0.2–1.2)
Total Protein: 6.8 g/dL (ref 6.1–8.1)

## 2015-12-21 LAB — PSA

## 2015-12-21 NOTE — Progress Notes (Signed)
Patient ID: Brent Hamilton MRN: [id number], DOB: [birth year], 55 y.o. Date of Encounter: 12/21/2015, 7:52 AM    Chief Complaint:  Chief Complaint  Patient presents with  . OTHER    fatigue, clamminess     HPI: 55 y.o. year old white male here for above.   THE FOLLOWING IS COPIED FROM OV NOTE 06/19/2015: He has only had one OV with me prior to today--9/2/12016---I reviewed that note today. See that note regarding history regarding anxiety/panic and use of Xanax.   Today he states that 1 week prior to Thanksgiving, he "was canned" regarding his job. 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  Wasn't wanting to do anything--not even go with family out to eat.  Says that 2 years ago, he felt similar when his dad passed away. At that time he was prescried Wellbutrin. That worked well for him. Says he found his old bottle and started that. Is noticing improvement in symptoms already. Is getting up in mornings, etc.   Also, needs refill on Xanax. Last Rx was 05/04/15 for # 90 + 0. Says he uses this as sparingly as he can, but recently has been having to take one every day and one every night to sleep.   ASSESSMENT/PLAN AT THAT OV: 1. Depression He is to take Wellbutrin as directed. If feels worse or develops adverse effects, call us immediately. Otherwise, will plan f/u OV 2 months.  - buPROPion (WELLBUTRIN XL) 150 MG 24 hr tablet; Take 1 tablet (150 mg total) by mouth daily.  Dispense: 30 tablet; Refill: 3  2. Anxiety He knows to use this as sparingly as possible.  - ALPRAZolam (XANAX) 0.5 MG tablet; Take 1 tablet (0.5 mg total) by mouth 3 (three) times daily as needed for anxiety.  Dispense: 90 tablet;  Refill: 1    --08/24/2015: Today he reports that he is feeling better since he has been on the Wellbutrin. 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 Says he has actually gotten some job offers but they are in [place] and [place] and he does not feel that a move like that would be good for him right now. Says that he definitely is feeling better but says that he still is not feeling "good." Says when he came to the last visit he was staying in bed until 2 PM.   Says that he "was in a bad place last time I came in." Regarding his Xanax use, he says he was doing 2 per day then 3 per day and over the last couple weeks has been limiting to 2 per day. Says that he knows these can be addicting and knows that he needs to limit these. He is having no adverse effects with the Wellbutrin.    10/18/2015: Patient reports he took the Wellbutrin for about 1-1/2 months but then he was sweating all the time so he weaned himself off. Is now completely off of the Wellbutrin. Says that he feels fine. Says that he has been offered 6 or 7 jobs but is going to be selective about what job he takes next. Says that they are  getting ready to move into a new house. Says he has been using the Xanax twice a day but says that he is fine with weaning that down and says that I can make the next prescription for #30. Says he does need refill on Xanax and testosterone. Says that Dr. Primus Bravo wants me to refer him to a rheumatologist. He says that he saw Dr. Brigitte Pulse in [place] in the past and is fine with seeing Dr. Brigitte Pulse again but he isn't sure whether he is out of his network for his insurance or not and so he will check into this and get back with me. No other complaints or concerns.  12/20/2015: Says he has felt a little nauseas.  Says he has a hard time starting urine, and when does come, weak stream. He mentions Wellbutrin--I remarked I thought he stopped that--he says he stopped it for 2-3  weeks---says the "sweats" did not resolve and he feels like he needs Wellbutrin regarding depression--so went back on it. Says he is having "night sweats" and feeling sweats during day also. Low grade fever--Highest reading he has gotten has been 100. Usually ~ 99.6. No neck pain. No neck rigidity.  Last week had some sore thorat but that resoved.  No mucus from nose. No cough, no phlegm.  Vomited twice last week. No diarrhea.  Feels slight "pressure" in low abdomen. I asked about any other new medications. Asked about pain management. Says he has been on Fentanyl and Dilaudid for 5 -6 years.Says there has been no change in those.  Had Epidural Injection recently.   Home Meds:   Outpatient Prescriptions Prior to Visit  Medication Sig Dispense Refill  . buPROPion (WELLBUTRIN XL) 300 MG 24 hr tablet Take 1 tablet (300 mg total) by mouth daily. 30 tablet 1  . cholecalciferol (VITAMIN D) 1000 UNITS tablet Take 1,000 Units by mouth.    . Cyanocobalamin (VITAMIN B 12 PO) Take 1 tablet by mouth daily.    . fentaNYL (DURAGESIC - DOSED MCG/HR) 50 MCG/HR Apply 1 patch to skin every other day if tolerated 15 patch 0  . FIBER PO Take 1 tablet by mouth 2 (two) times daily.    Marland Kitchen HYDROmorphone (DILAUDID) 4 MG tablet Limit one half to one tablet by mouth per day or twice per day if tolerated if tolerated for breakthrough pain while wearing fentanyl patch 60 tablet 0  . lisinopril (PRINIVIL,ZESTRIL) 40 MG tablet Take 1 tablet (40 mg total) by mouth daily. 90 tablet 1  . testosterone cypionate (DEPOTESTOSTERONE CYPIONATE) 200 MG/ML injection Inject 1 mL (200 mg total) into the muscle every 14 (fourteen) days. 10 mL 1  . cefUROXime (CEFTIN) 250 MG tablet Take 1 tablet (250 mg total) by mouth 2 (two) times daily with a meal. (Patient not taking: Reported on 12/06/2015) 14 tablet 0  . cefUROXime (CEFTIN) 250 MG tablet Take 1 tablet (250 mg total) by mouth 2 (two) times daily with a meal. 14 tablet 0  . gabapentin  (NEURONTIN) 100 MG capsule Take 1 capsule (100 mg total) by mouth 3 (three) times daily. (Patient not taking: Reported on 12/06/2015) 60 capsule 0  . methylPREDNISolone (MEDROL DOSEPAK) 4 MG TBPK tablet Take by mouth taper from 4 doses each day to 1 dose and stop. Dosepak (Patient not taking: Reported on 12/06/2015) 21 tablet 0   Facility-Administered Medications Prior to Visit  Medication Dose Route Frequency Provider Last Rate Last Dose  . bupivacaine (PF) (MARCAINE) 0.25 % injection 30 mL  30 mL Other Once Mohammed Kindle, MD      . ceFAZolin (ANCEF) IVPB 1 g/50 mL premix  1 g Intravenous Once Mohammed Kindle, MD      . ceFAZolin (ANCEF) IVPB 1 g/50 mL premix  1 g Intravenous Once Mohammed Kindle, MD      . fentaNYL (SUBLIMAZE) injection 100 mcg  100 mcg Intravenous Once Mohammed Kindle, MD      . lactated ringers infusion 1,000 mL  1,000 mL Intravenous Continuous Mohammed Kindle, MD      . lactated ringers infusion 1,000 mL  1,000 mL Intravenous Continuous Mohammed Kindle, MD      . lactated ringers infusion 1,000 mL  1,000 mL Intravenous Continuous Mohammed Kindle, MD      . lactated ringers infusion 1,000 mL  1,000 mL Intravenous Continuous Mohammed Kindle, MD      . lidocaine (PF) (XYLOCAINE) 1 % injection 10 mL  10 mL Subcutaneous Once Mohammed Kindle, MD      . midazolam (VERSED) 5 MG/5ML injection 5 mg  5 mg Intravenous Once Mohammed Kindle, MD      . midazolam (VERSED) 5 MG/5ML injection 5 mg  5 mg Intravenous Once Mohammed Kindle, MD      . orphenadrine (NORFLEX) injection 60 mg  60 mg Intramuscular Once Mohammed Kindle, MD      . orphenadrine (NORFLEX) injection 60 mg  60 mg Intramuscular Once Mohammed Kindle, MD      . triamcinolone acetonide (KENALOG-40) injection 40 mg  40 mg Other Once Mohammed Kindle, MD        Allergies:  Allergies  Allergen Reactions  . Flonase [Fluticasone] Swelling, Anaphylaxis and Rash  . Oxycodone Swelling  . Morphine And Related Itching and Rash    Rash developed after about 10  days of taking the tablets      Review of Systems: See HPI for pertinent ROS. All other ROS negative.    Physical Exam: Blood pressure 150/90, pulse 90, temperature 97.8 F (36.6 C), temperature source Oral, resp. rate 16, height 6\' 1"  (1.854 m), weight 262 lb (118.842 kg), SpO2 98 %., Body mass index is 34.57 kg/(m^2). General:  Mild-Mod Obese WM. Appears in no acute distress. Neck: Supple. No thyromegaly. No lymphadenopathy. No neck pian. No nuchal rigidity. Lungs: Clear bilaterally to auscultation without wheezes, rales, or rhonchi. Breathing is unlabored. Heart: Regular rhythm. No murmurs, rubs, or gallops. Abdomen: Normal Bowel Sounds. Soft. Reports "pressure" sensation when I palpate lower abdomen. No area of tenderness with palpation.  Msk:  Strength and tone normal for age. Extremities/Skin: clammy, sweaty. Neuro: Alert and oriented X 3. Moves all extremities spontaneously. Gait is normal. CNII-XII grossly in tact. Psych:  Responds to questions appropriately with a normal affect.     ASSESSMENT AND PLAN:  55 y.o. year old male with   Symptoms concerning for Serotonin Syndrome Also Anticholinergic symptoms---He is on no Anti-Cholinergics for me to D/C Also concerning that he had recent Epidural   Will check labs and f/u these results Will have him stop the Wellbutrin Will have him f/u with Dr. Primus Bravo this week  1. Dysuria --- He is not having burning with urination---just symptoms in HPI---- - Urinalysis, Routine w reflex microscopic (not at [facility]) - CBC with Differential/Platelet - COMPLETE METABOLIC PANEL WITH GFR - TSH - B. burgdorfi antibodies by WB - Rocky mtn spotted fvr abs pnl(IgG+IgM) - Urine culture - Culture, blood (single) w Reflex to ID Panel - PSA  2. Fever, unspecified -  CBC with Differential/Platelet - COMPLETE METABOLIC PANEL WITH GFR - TSH - B. burgdorfi antibodies by WB - Rocky mtn spotted fvr abs pnl(IgG+IgM) - Urine culture - Culture, blood  (single) w Reflex to ID Panel  3. Nausea without vomiting - CBC with Differential/Platelet - COMPLETE METABOLIC PANEL WITH GFR - TSH - B. burgdorfi antibodies by WB - Rocky mtn spotted fvr abs pnl(IgG+IgM) - Urine culture - Culture, blood (single) w Reflex to ID Panel  4. Clamminess - CBC with Differential/Platelet - COMPLETE METABOLIC PANEL WITH GFR - TSH - B. burgdorfi antibodies by WB - Rocky mtn spotted fvr abs pnl(IgG+IgM) - Urine culture - Culture, blood (single) w Reflex to ID Panel  5. Sweating - CBC with Differential/Platelet - COMPLETE METABOLIC PANEL WITH GFR - TSH - B. burgdorfi antibodies by WB - Rocky mtn spotted fvr abs pnl(IgG+IgM) - Urine culture - Culture, blood (single) w Reflex to ID Panel    -------------------THE FOLLOWING IS COPIED FROM OV NOTE 10/18/2015----THE FOLLOWING WAS NOT ADDRESSED AT OV 12/20/2015---------------------- 1. Depression Stable/Controlled/Resolved--was situational.  - ALPRAZolam (XANAX) 0.5 MG tablet; Take one daily as needed for panic/anxiety.  Dispense: 30 tablet; Refill: 0  2. Anxiety Stable/Controlled---will wean off Xanax. Has been taking BID. Decrease to QD PRN - ALPRAZolam (XANAX) 0.5 MG tablet; Take one daily as needed for panic/anxiety.  Dispense: 30 tablet; Refill: 0  3. Benign hypertension Blood pressure is controlled/at goal. Continue current medications. Check lab to monitor. - COMPLETE METABOLIC PANEL WITH GFR  4. Decreased testosterone level Check labs to monitor. Patient states that he had his last testosterone injection 2 weeks ago. ---------------------------------WILL DISCUSS ORDERING DEXA SCAN AT NEXT OV------------------------------------------------------------ - CBC with Differential/Platelet - Testosterone - PSA    Signed, Karis Juba, [place], [facility] 12/21/2015 7:52 AM

## 2015-12-25 LAB — LYME ABY, WSTRN BLT IGG & IGM W/BANDS

## 2015-12-25 LAB — TSH

## 2015-12-25 LAB — ROCKY MTN SPOTTED FVR ABS PNL(IGG+IGM)

## 2015-12-26 ENCOUNTER — Telehealth: Payer: Self-pay | Admitting: Physician Assistant

## 2015-12-26 LAB — CULTURE, BLOOD (SINGLE): ORGANISM ID, BACTERIA: NO GROWTH

## 2015-12-26 NOTE — Telephone Encounter (Signed)
Brent Hamilton from [facility] called stating that they didn't have enough specimen for them to run the TSH, Lime and [facility] test. If these are still needed we will need to re-draw.  CB# [phone] Opt 2

## 2015-12-27 ENCOUNTER — Encounter: Payer: Self-pay | Admitting: Pain Medicine

## 2015-12-27 ENCOUNTER — Ambulatory Visit: Payer: PPO | Attending: Pain Medicine | Admitting: Pain Medicine

## 2015-12-27 VITALS — BP 138/83 | HR 80 | Temp 98.0°F | Resp 16 | Ht 72.0 in | Wt 259.0 lb

## 2015-12-27 DIAGNOSIS — M1990 Unspecified osteoarthritis, unspecified site: Secondary | ICD-10-CM | POA: Diagnosis not present

## 2015-12-27 DIAGNOSIS — M791 Myalgia: Secondary | ICD-10-CM | POA: Diagnosis not present

## 2015-12-27 DIAGNOSIS — M47817 Spondylosis without myelopathy or radiculopathy, lumbosacral region: Secondary | ICD-10-CM | POA: Diagnosis not present

## 2015-12-27 DIAGNOSIS — M5134 Other intervertebral disc degeneration, thoracic region: Secondary | ICD-10-CM | POA: Diagnosis not present

## 2015-12-27 DIAGNOSIS — M5126 Other intervertebral disc displacement, lumbar region: Secondary | ICD-10-CM | POA: Diagnosis not present

## 2015-12-27 DIAGNOSIS — M47812 Spondylosis without myelopathy or radiculopathy, cervical region: Secondary | ICD-10-CM

## 2015-12-27 DIAGNOSIS — M5416 Radiculopathy, lumbar region: Secondary | ICD-10-CM | POA: Diagnosis not present

## 2015-12-27 DIAGNOSIS — M069 Rheumatoid arthritis, unspecified: Secondary | ICD-10-CM | POA: Diagnosis not present

## 2015-12-27 DIAGNOSIS — M542 Cervicalgia: Secondary | ICD-10-CM | POA: Diagnosis not present

## 2015-12-27 DIAGNOSIS — M47814 Spondylosis without myelopathy or radiculopathy, thoracic region: Secondary | ICD-10-CM | POA: Insufficient documentation

## 2015-12-27 DIAGNOSIS — M5136 Other intervertebral disc degeneration, lumbar region: Secondary | ICD-10-CM

## 2015-12-27 DIAGNOSIS — M4806 Spinal stenosis, lumbar region: Secondary | ICD-10-CM | POA: Diagnosis not present

## 2015-12-27 DIAGNOSIS — M533 Sacrococcygeal disorders, not elsewhere classified: Secondary | ICD-10-CM | POA: Diagnosis not present

## 2015-12-27 DIAGNOSIS — M4316 Spondylolisthesis, lumbar region: Secondary | ICD-10-CM | POA: Insufficient documentation

## 2015-12-27 DIAGNOSIS — M5030 Other cervical disc degeneration, unspecified cervical region: Secondary | ICD-10-CM | POA: Diagnosis not present

## 2015-12-27 DIAGNOSIS — M51369 Other intervertebral disc degeneration, lumbar region without mention of lumbar back pain or lower extremity pain: Secondary | ICD-10-CM

## 2015-12-27 DIAGNOSIS — Z981 Arthrodesis status: Secondary | ICD-10-CM

## 2015-12-27 DIAGNOSIS — M19012 Primary osteoarthritis, left shoulder: Secondary | ICD-10-CM

## 2015-12-27 DIAGNOSIS — M5124 Other intervertebral disc displacement, thoracic region: Secondary | ICD-10-CM | POA: Diagnosis not present

## 2015-12-27 DIAGNOSIS — M170 Bilateral primary osteoarthritis of knee: Secondary | ICD-10-CM

## 2015-12-27 DIAGNOSIS — M48062 Spinal stenosis, lumbar region with neurogenic claudication: Secondary | ICD-10-CM

## 2015-12-27 DIAGNOSIS — M47816 Spondylosis without myelopathy or radiculopathy, lumbar region: Secondary | ICD-10-CM

## 2015-12-27 DIAGNOSIS — M19011 Primary osteoarthritis, right shoulder: Secondary | ICD-10-CM

## 2015-12-27 MED ORDER — HYDROMORPHONE HCL 4 MG PO TABS: ORAL_TABLET | ORAL | Status: DC

## 2015-12-27 MED ORDER — FENTANYL 50 MCG/HR TD PT72: MEDICATED_PATCH | TRANSDERMAL | Status: DC

## 2015-12-27 NOTE — Progress Notes (Signed)
Safety precautions to be maintained throughout the outpatient stay will include: orient to surroundings, keep bed in low position, maintain call bell within reach at all times, provide assistance with transfer out of bed and ambulation.  

## 2015-12-27 NOTE — Patient Instructions (Signed)
PLAN   Continue present medication Dilaudid and fentanyl patch Follow-up with rheumatologist to discuss medications for arthritis and medication for arthritic pain and continue present dose of Dilaudid and fentanyl patches as previously discussed and CAUTION Needs to decrease and discontinue the use of Xanax as discussed. Please discuss this with Brent Hamilton as mentioned   F/U PCP Dr.Shah or Brent Hamilton  for evaliation of  BP and general medical condition  Also remember to discuss decreasing Xanax and discontinuing the use of Xanax as previously mentioned  F/U surgical evaluation.   F/U neurological evaluation May consider  PNCV/EMG studies and other studies pending follow-up evaluations  F/U with your rheumatologist as we discussed  May consider radiofrequency procedures and implantation devices and other treatment pending follow-up evaluations  Patient is to call pain management for concerns regarding condition prior to scheduled return appointment

## 2015-12-27 NOTE — Telephone Encounter (Signed)
Left pt message to drop by office anytime and have more blood drawn.

## 2015-12-27 NOTE — Progress Notes (Signed)
   Subjective:    Patient ID: Rockney Ghee, male    DOB: 1961-01-24, 55 y.o.   MRN: [id number]  HPI  The patient is a 55 year old gentleman who returns to pain management for further evaluation and treatment of pain involving the region of the neck upper extremity regions elbow especially on the right as well as lower back and lower extremity pain. The patient states that he is doing very well following previous lumbar epidural steroid injection. The patient continues to follow-up and fentanyl patch as well. The patient is looking forward to going fishing and is without interference of activities of daily living due to significant pain. The patient states that he is able to stand walking twisting turning without experiencing severe disabling symptoms. We will continue medications as prescribed and we will consider additional modifications of treatment regimen pending follow-up evaluation. All agreed to suggested treatment plan   Review of Systems     Objective:   Physical Exam   Physical examination reveal patient to be with minimal tenderness of the splenius capitis and occipitalis region a minimal tenderness over the cervical facet cervical paraspinal musculature region as well as the thoracic facet thoracic paraspinal musculature region. Tinel and Phalen's maneuver were without increase of pain of significant degree. The patient appeared to be with bilaterally equal grip strength. There was tenderness of the medial and lateral epicondyles of the elbow especially on the right. There was tenderness over the thoracic facet thoracic paraspinal musculature region with no crepitus of the thoracic region noted. Palpation of the lumbar paraspinal musculatures and lumbar facet region was attends to palpation of moderate mild to moderate degree with lateral bending rotation extension and palpation of the lumbar facets reproducing mild to moderate discomfort. Straight leg raise was tolerates approximately  30 without increased pain with dorsiflexion noted. EHL strength appeared to be equal and no definite sensory deficit or dermatomal distribution was detected. There was negative clonus negative Homans. Abdomen nontender and no costovertebral tenderness noted.     Assessment & Plan:     Degenerative disc disease lumbar spine  T12-L1, L1 L2 facet hypertrophy L2-3 facet hypertrophy L4-5 anterior listhesis at this level with mild disc bulging and ligamentous hypertrophy with moderate bilateral L4 foraminal stenosis, L5-S1 facet hypertrophy  Lumbar facet syndrome  Lumbar stenosis with neurogenic claudication  Degenerative disc disease thoracic spine T11-12 circumferential disc bulge T10-11 partially visible degenerative changes of the thoracic  Degenerative disc disease cervical spine  Degenerative joint disease  Rheumatoid arthritis     PLAN   Continue present medication Dilaudid and fentanyl patch Follow-up with rheumatologist to discuss medications for arthritis and medication for arthritic pain and continue present dose of Dilaudid and fentanyl patches as previously discussed and CAUTION Needs to decrease and discontinue the use of Xanax as discussed. Please discuss this with [name] as mentioned   F/U PCP Dr.Shah or [name]  for evaliation of  BP and general medical condition  Also remember to discuss decreasing Xanax and discontinuing the use of Xanax as previously mentioned  F/U surgical evaluation.   F/U neurological evaluation May consider  PNCV/EMG studies and other studies pending follow-up evaluations  F/U with your rheumatologist as we discussed  May consider radiofrequency procedures and implantation devices and other treatment pending follow-up evaluations  Patient is to call pain management for concerns regarding condition prior to scheduled return appointment

## 2016-01-03 ENCOUNTER — Telehealth: Payer: Self-pay | Admitting: Physician Assistant

## 2016-01-03 ENCOUNTER — Encounter: Payer: Self-pay | Admitting: Pain Medicine

## 2016-01-03 NOTE — Telephone Encounter (Signed)
Pt received a call from [place] stating that Brent Hamilton would like for him to come back in to have repeat blood work done. He will come in if needed, but says that he feels 110% better and only wants to come back in if she feels it completely necessary. Please advise.  [phone]

## 2016-01-04 NOTE — Telephone Encounter (Signed)
Mr. Brent Hamilton returned my call and we spoke directly with each other today. He states that after his last visit with me he did realize that it was the fentanyl patch causing his symptoms. Says that after that last visit he researched and read about the fentanyl patch and realized that all of his symptoms he was having could be secondary to fentanyl. Says that he has been weaning down on the fentanyl and has spoken with Dr. Primus Bravo. He has an appointment with Dr. Primus Bravo tomorrow and they are to completely stop the fentanyl and change medications. States that he is having no more night sweats no fever and says that his urine stream has returned to normal.

## 2016-01-04 NOTE — Telephone Encounter (Signed)
I left message for pt to call me back. I will speak with him directly.

## 2016-01-05 ENCOUNTER — Encounter: Payer: Self-pay | Admitting: Pain Medicine

## 2016-01-05 ENCOUNTER — Ambulatory Visit: Payer: PPO | Attending: Pain Medicine | Admitting: Pain Medicine

## 2016-01-05 VITALS — BP 152/80 | HR 95 | Temp 98.6°F | Resp 18 | Ht 72.0 in | Wt 259.0 lb

## 2016-01-05 DIAGNOSIS — M19011 Primary osteoarthritis, right shoulder: Secondary | ICD-10-CM

## 2016-01-05 DIAGNOSIS — M47814 Spondylosis without myelopathy or radiculopathy, thoracic region: Secondary | ICD-10-CM | POA: Diagnosis not present

## 2016-01-05 DIAGNOSIS — Z981 Arthrodesis status: Secondary | ICD-10-CM

## 2016-01-05 DIAGNOSIS — M5030 Other cervical disc degeneration, unspecified cervical region: Secondary | ICD-10-CM | POA: Diagnosis not present

## 2016-01-05 DIAGNOSIS — M5136 Other intervertebral disc degeneration, lumbar region: Secondary | ICD-10-CM | POA: Insufficient documentation

## 2016-01-05 DIAGNOSIS — M48062 Spinal stenosis, lumbar region with neurogenic claudication: Secondary | ICD-10-CM

## 2016-01-05 DIAGNOSIS — M47812 Spondylosis without myelopathy or radiculopathy, cervical region: Secondary | ICD-10-CM

## 2016-01-05 DIAGNOSIS — M170 Bilateral primary osteoarthritis of knee: Secondary | ICD-10-CM

## 2016-01-05 DIAGNOSIS — M542 Cervicalgia: Secondary | ICD-10-CM | POA: Diagnosis not present

## 2016-01-05 DIAGNOSIS — M1990 Unspecified osteoarthritis, unspecified site: Secondary | ICD-10-CM | POA: Insufficient documentation

## 2016-01-05 DIAGNOSIS — M533 Sacrococcygeal disorders, not elsewhere classified: Secondary | ICD-10-CM | POA: Diagnosis not present

## 2016-01-05 DIAGNOSIS — M5124 Other intervertebral disc displacement, thoracic region: Secondary | ICD-10-CM | POA: Diagnosis not present

## 2016-01-05 DIAGNOSIS — M4806 Spinal stenosis, lumbar region: Secondary | ICD-10-CM | POA: Insufficient documentation

## 2016-01-05 DIAGNOSIS — M5134 Other intervertebral disc degeneration, thoracic region: Secondary | ICD-10-CM | POA: Diagnosis not present

## 2016-01-05 DIAGNOSIS — M069 Rheumatoid arthritis, unspecified: Secondary | ICD-10-CM | POA: Insufficient documentation

## 2016-01-05 DIAGNOSIS — M546 Pain in thoracic spine: Secondary | ICD-10-CM | POA: Diagnosis not present

## 2016-01-05 DIAGNOSIS — M47817 Spondylosis without myelopathy or radiculopathy, lumbosacral region: Secondary | ICD-10-CM | POA: Diagnosis not present

## 2016-01-05 DIAGNOSIS — M791 Myalgia: Secondary | ICD-10-CM | POA: Diagnosis not present

## 2016-01-05 DIAGNOSIS — M47816 Spondylosis without myelopathy or radiculopathy, lumbar region: Secondary | ICD-10-CM

## 2016-01-05 DIAGNOSIS — M5416 Radiculopathy, lumbar region: Secondary | ICD-10-CM | POA: Diagnosis not present

## 2016-01-05 DIAGNOSIS — M5126 Other intervertebral disc displacement, lumbar region: Secondary | ICD-10-CM | POA: Insufficient documentation

## 2016-01-05 DIAGNOSIS — M19012 Primary osteoarthritis, left shoulder: Secondary | ICD-10-CM

## 2016-01-05 DIAGNOSIS — M4316 Spondylolisthesis, lumbar region: Secondary | ICD-10-CM | POA: Insufficient documentation

## 2016-01-05 DIAGNOSIS — M51369 Other intervertebral disc degeneration, lumbar region without mention of lumbar back pain or lower extremity pain: Secondary | ICD-10-CM

## 2016-01-05 MED ORDER — OXYCODONE HCL ER 20 MG PO T12A: EXTENDED_RELEASE_TABLET | ORAL | Status: DC

## 2016-01-05 MED ORDER — HYDROMORPHONE HCL 4 MG PO TABS: ORAL_TABLET | ORAL | Status: DC

## 2016-01-05 NOTE — Patient Instructions (Addendum)
PLAN   Continue present medication Dilaudid and STOP USE OF FENTANYL PATCH and BEGIN OXYCONTIN   CAUTION OxyContin Xanax and Dilaudid can cause significant side effects Need to decrease and discontinue the use of Xanax as discussed. Please discuss this with Brent Hamilton as mentioned CAUTION   Medications (OxyContin Dilaudid and Xanax) can cause respiratory depression and cause you to stop breathing, cause excessive sedation, cause confusion and other side effects.  Exercise extreme caution when taking medication and call EMS or go to the Emergency Department immediately if you develop any of these symptoms Do not take any Dilaudid for the first week while beginning OxyContin   F/U PCP Dr.Shah or Brent Hamilton  for evaliation of  BP and general medical condition  Also remember to discuss decreasing Xanax and discontinuing the use of Xanax as previously mentioned  F/U surgical evaluation.   F/U neurological evaluation May consider  PNCV/EMG studies and other studies pending follow-up evaluations  F/U with your rheumatologist as we discussed  May consider radiofrequency procedures and implantation devices and other treatment pending follow-up evaluations  Patient is to call pain management for concerns regarding condition prior to scheduled return appointment  STOP Fentanyl patch, start Oxycontin as directed, and DO NOT use Dilaudid for one week.  Take off Fentanyl Patch and do not take first Oxycontin for 10 hours after removal.  Caution with side effects for medications of drowsiness, respiratory depression, confusion.

## 2016-01-05 NOTE — Progress Notes (Signed)
Subjective:    Patient ID: Brent Hamilton, male    DOB: [birth year], 55 y.o.   MRN: [id number]  HPI  The patient is a 55 year old gentleman who returns to pain management for further evaluation and treatment of pain involving the neck entire back upper and lower extremity regions. The patient is with known degenerative changes of the cervical thoracic and lumbar region and is a diagnosis of rheumatoid arthritis as well. The patient's pain has been fairly well-controlled with interventional treatment as well as with fentanyl patch with Dilaudid for breakthrough pain. The patient recently developed undesirable side effects due to the use of fentanyl patch and requested change of his medication. We will replace fentanyl patch of OxyContin and patient will continue the use of Dilaudid for breakthrough pain. The patient has been cautioned regarding side effects of the OxyContin and Dilaudid and will remove fentanyl patch and avoid using fentanyl patch when patient begins to use OxyContin. The patient also with the use of Dilaudid for the first week which patient has been using for breakthrough pain. We will consider modification of treatment regimen as discussed and follow-up evaluation. All agreed to suggested treatment plan.  Review of Systems     Objective:   Physical Exam  There was tenderness of moderate degree of the cervical facet cervical paraspinal musculature region and the acromial clavicular and glenohumeral joint regions. Palpation of the elbows were was tends to palpation of the medial and lateral epicondyles of the elbow especially on the right. Patient appeared to be with bilaterally equal grip strength and Tinel and Phalen's maneuver were without increased pain of significant degree. There was tenderness to palpation over the thoracic region with no crepitus of the thoracic region noted. Lateral bending rotation extension and palpation of the lumbar facets reproduce moderate discomfort.  Palpation over the region of the PSIS and PII S region reproduces moderate discomfort with moderate tenderness of the gluteal and piriformis musculature region a mild tenderness of the greater trochanteric region iliotibial band region. Straight leg raising was tolerates approximately 20 without increased pain with dorsiflexion noted. There was no definite sensory deficit or dermatomal distribution detected. There was negative clonus negative Homans. Abdomen nontender with no costovertebral angle tenderness noted    Assessment & Plan:       Degenerative disc disease lumbar spine  T12-L1, L1 L2 facet hypertrophy L2-3 facet hypertrophy L4-5 anterior listhesis at this level with mild disc bulging and ligamentous hypertrophy with moderate bilateral L4 foraminal stenosis, L5-S1 facet hypertrophy  Lumbar facet syndrome  Lumbar stenosis with neurogenic claudication  Degenerative disc disease thoracic spine T11-12 circumferential disc bulge T10-11 partially visible degenerative changes of the thoracic  Degenerative disc disease cervical spine  Degenerative joint disease  Rheumatoid arthritis     PLAN   Continue present medication Dilaudid and STOP USE OF FENTANYL PATCH and BEGIN OXYCONTIN   CAUTION OxyContin Xanax and Dilaudid can cause significant side effects Need to decrease and discontinue the use of Xanax as discussed. Please discuss this with Brent Hamilton as mentioned CAUTION   Medications (OxyContin Dilaudid and Xanax) can cause respiratory depression and cause you to stop breathing, cause excessive sedation, cause confusion and other side effects.  Exercise extreme caution when taking medication and call EMS or go to the Emergency Department immediately if you develop any of these symptoms Do not take any Dilaudid for the first week while beginning OxyContin   F/U PCP Brent Hamilton or Brent Hamilton  for evaliation of  BP and general medical condition  Also remember to discuss decreasing Xanax and  discontinuing the use of Xanax as previously mentioned  F/U surgical evaluation.   F/U neurological evaluation May consider  PNCV/EMG studies and other studies pending follow-up evaluations  F/U with your rheumatologist as we discussed  May consider radiofrequency procedures and implantation devices and other treatment pending follow-up evaluations  Patient is to call pain management for concerns regarding condition prior to scheduled return appointment

## 2016-01-05 NOTE — Progress Notes (Signed)
Safety precautions to be maintained throughout the outpatient stay will include: orient to surroundings, keep bed in low position, maintain call bell within reach at all times, provide assistance with transfer out of bed and ambulation.  

## 2016-01-08 ENCOUNTER — Encounter: Payer: Self-pay | Admitting: Pain Medicine

## 2016-01-08 ENCOUNTER — Telehealth: Payer: Self-pay | Admitting: Pain Medicine

## 2016-01-08 NOTE — Telephone Encounter (Signed)
Went to fill script he received on Friday from Dr. Primus Bravo, pharmacy says insurance will not pay for 20 mg CR they will only pay for 10mg  CR or 30mg  CR so he wants to know if Dr. Primus Bravo will change script to 30 mg CR , he has not used the patch again since he took off on Friday, please call patient at [phone]

## 2016-01-09 ENCOUNTER — Other Ambulatory Visit: Payer: Self-pay | Admitting: Pain Medicine

## 2016-01-09 MED ORDER — OXYCODONE HCL ER 30 MG PO T12A: EXTENDED_RELEASE_TABLET | ORAL | Status: DC

## 2016-01-09 NOTE — Telephone Encounter (Signed)
Dr. Primus Bravo agrees to write 30 mg. Patient notified, also informed that these are stronger and to be aware of possible side effects and signs of overdose.

## 2016-01-15 ENCOUNTER — Encounter: Payer: Self-pay | Admitting: Physician Assistant

## 2016-01-15 ENCOUNTER — Other Ambulatory Visit: Payer: Self-pay | Admitting: Physician Assistant

## 2016-01-15 NOTE — Telephone Encounter (Signed)
Medication refilled per protocol. 

## 2016-01-23 ENCOUNTER — Encounter: Payer: Self-pay | Admitting: Pain Medicine

## 2016-01-23 ENCOUNTER — Ambulatory Visit: Payer: PPO | Attending: Pain Medicine | Admitting: Pain Medicine

## 2016-01-23 VITALS — BP 127/76 | HR 81 | Temp 98.3°F | Ht 72.0 in | Wt 255.0 lb

## 2016-01-23 DIAGNOSIS — M47812 Spondylosis without myelopathy or radiculopathy, cervical region: Secondary | ICD-10-CM

## 2016-01-23 DIAGNOSIS — M1990 Unspecified osteoarthritis, unspecified site: Secondary | ICD-10-CM | POA: Diagnosis not present

## 2016-01-23 DIAGNOSIS — Z981 Arthrodesis status: Secondary | ICD-10-CM

## 2016-01-23 DIAGNOSIS — M4806 Spinal stenosis, lumbar region: Secondary | ICD-10-CM | POA: Diagnosis not present

## 2016-01-23 DIAGNOSIS — M47814 Spondylosis without myelopathy or radiculopathy, thoracic region: Secondary | ICD-10-CM | POA: Diagnosis not present

## 2016-01-23 DIAGNOSIS — M47816 Spondylosis without myelopathy or radiculopathy, lumbar region: Secondary | ICD-10-CM

## 2016-01-23 DIAGNOSIS — M069 Rheumatoid arthritis, unspecified: Secondary | ICD-10-CM | POA: Insufficient documentation

## 2016-01-23 DIAGNOSIS — M5416 Radiculopathy, lumbar region: Secondary | ICD-10-CM | POA: Diagnosis not present

## 2016-01-23 DIAGNOSIS — M533 Sacrococcygeal disorders, not elsewhere classified: Secondary | ICD-10-CM | POA: Diagnosis not present

## 2016-01-23 DIAGNOSIS — M5126 Other intervertebral disc displacement, lumbar region: Secondary | ICD-10-CM | POA: Diagnosis not present

## 2016-01-23 DIAGNOSIS — M5134 Other intervertebral disc degeneration, thoracic region: Secondary | ICD-10-CM | POA: Diagnosis not present

## 2016-01-23 DIAGNOSIS — M5124 Other intervertebral disc displacement, thoracic region: Secondary | ICD-10-CM | POA: Diagnosis not present

## 2016-01-23 DIAGNOSIS — M4316 Spondylolisthesis, lumbar region: Secondary | ICD-10-CM | POA: Diagnosis not present

## 2016-01-23 DIAGNOSIS — M62830 Muscle spasm of back: Secondary | ICD-10-CM | POA: Insufficient documentation

## 2016-01-23 DIAGNOSIS — M5136 Other intervertebral disc degeneration, lumbar region: Secondary | ICD-10-CM | POA: Diagnosis not present

## 2016-01-23 DIAGNOSIS — M791 Myalgia: Secondary | ICD-10-CM | POA: Diagnosis not present

## 2016-01-23 DIAGNOSIS — M79604 Pain in right leg: Secondary | ICD-10-CM | POA: Diagnosis not present

## 2016-01-23 DIAGNOSIS — M545 Low back pain: Secondary | ICD-10-CM | POA: Diagnosis not present

## 2016-01-23 DIAGNOSIS — M47817 Spondylosis without myelopathy or radiculopathy, lumbosacral region: Secondary | ICD-10-CM | POA: Diagnosis not present

## 2016-01-23 DIAGNOSIS — M79605 Pain in left leg: Secondary | ICD-10-CM | POA: Diagnosis not present

## 2016-01-23 DIAGNOSIS — M5030 Other cervical disc degeneration, unspecified cervical region: Secondary | ICD-10-CM | POA: Diagnosis not present

## 2016-01-23 MED ORDER — OXYCODONE HCL ER 30 MG PO T12A: EXTENDED_RELEASE_TABLET | ORAL | 0 refills | Status: DC

## 2016-01-23 MED ORDER — HYDROMORPHONE HCL 4 MG PO TABS: ORAL_TABLET | ORAL | 0 refills | Status: DC

## 2016-01-23 NOTE — Progress Notes (Signed)
The patient is a 55 year old gentleman who returns to pain management for further evaluation and treatment of pain involving the neck upper extremity regions lower back and lower extremity regions. The patient states that he is tolerating OxyContin very well with Dilaudid for breakthrough pain and feels that there is some increased pain during the day which appears to be due to the dosing of the OxyContin. We will consider dosing the OxyContin every 8 hours versus every 12 hours. At the present time we will allow patient to take Dilaudid 2-3 times per day for breakthrough pain while patient continues the dose of OxyContin as prescribed. The patient denies any trauma change in events of daily living the call significant change in symptomatology. The patient will continue presently prescribed medication. All agreed with suggested treatment plan     Physical examination  Physical examination reveal patient to be with tenderness to palpation of the paraspinal musculature region cervical region cervical facet region a moderate degree. There was limited range of motion of the cervical spine with unremarkable Spurling's maneuver. Palpation of the acromioclavicular and glenohumeral joint regions reproduce moderate discomfort. Palpation over the thoracic region was attends to palpation of moderate degree with moderate muscle spasm. No crepitus of the thoracic region was noted. The patient was attends to palpation of the medial and lateral epicondyles of the right upper extremity of moderate degree. Patient was with out significant increase of pain with Tinel and Phalen's maneuver. Palpation of the lumbar region was with moderate tenderness to palpation with lateral bending rotation extension and palpation of the lumbar facets reproducing moderate discomfort. There was moderate tenderness of the PSIS and PII S region as well as the gluteal and piriformis musculature region a moderate degree. Straight leg raising  was tolerates 20 without increased pain with dorsiflexion noted. No sensory deficit or dermatomal distribution detected. The knees were tenderness to palpation in crepitus of the knees with limited range of motion of the knees with negative anterior and posterior drawer signs without ballottement of the patella. EHL strength was decreased. There was negative clonus negative Homans and abdomen was nontender with no costovertebral tenderness noted    Assessment  Degenerative disc disease lumbar spine  T12-L1, L1 L2 facet hypertrophy L2-3 facet hypertrophy L4-5 anterior listhesis at this level with mild disc bulging and ligamentous hypertrophy with moderate bilateral L4 foraminal stenosis, L5-S1 facet hypertrophy  Lumbar facet syndrome  Lumbar stenosis with neurogenic claudication  Degenerative disc disease thoracic spine T11-12 circumferential disc bulge T10-11 partially visible degenerative changes of the thoracic  Degenerative disc disease cervical spine  Degenerative joint disease  Rheumatoid arthritis    Plan  Continue present medications Dilaudid and OxyContin  CAUTION  medications can cause significant side effects Need to decrease and discontinue the use of Xanax as discussed. Please discuss this with [name] as mentioned CAUTION   Medications (OxyContin Dilaudid and Xanax) can cause respiratory depression and cause you to stop breathing, cause excessive sedation, cause confusion and other side effects.  Exercise extreme caution when taking medication and call EMS or go to the Emergency Department immediately if you develop any of these symptoms   F/U PCP Dr.Shah or [name]  for evaluation of  BP and general medical condition  Also remember to discuss decreasing Xanax and discontinuing the use of Xanax as previously mentioned  F/U surgical evaluation.   F/U neurological evaluation May consider  PNCV/EMG studies and other studies pending follow-up evaluations  F/U with your  rheumatologist  as we discussed  May consider radiofrequency procedures and implantation devices and other treatment pending follow-up evaluations  Patient is to call pain management for concerns regarding condition prior to scheduled return appointment

## 2016-01-23 NOTE — Patient Instructions (Signed)
PLAN   Continue present medications Dilaudid and OxyContin  CAUTION  medications can cause significant side effects Need to decrease and discontinue the use of Xanax as discussed. Please discuss this with [name] as mentioned CAUTION   Medications (OxyContin Dilaudid and Xanax) can cause respiratory depression and cause you to stop breathing, cause excessive sedation, cause confusion and other side effects.  Exercise extreme caution when taking medication and call EMS or go to the Emergency Department immediately if you develop any of these symptoms   F/U PCP Dr.Shah or [name]  for evaluation of  BP and general medical condition  Also remember to discuss decreasing Xanax and discontinuing the use of Xanax as previously mentioned  F/U surgical evaluation.   F/U neurological evaluation May consider  PNCV/EMG studies and other studies pending follow-up evaluations  F/U with your rheumatologist as we discussed  May consider radiofrequency procedures and implantation devices and other treatment pending follow-up evaluations  Patient is to call pain management for concerns regarding condition prior to scheduled return appointment

## 2016-01-24 ENCOUNTER — Telehealth: Payer: Self-pay | Admitting: Pain Medicine

## 2016-01-24 NOTE — Telephone Encounter (Signed)
Robin from [place] need to speak with nurse or phys. About script received to fill, please call at [phone]

## 2016-01-24 NOTE — Telephone Encounter (Signed)
Called [facility] back at [phone] and answering machine came on with the number left- instructed them to call back if any questions

## 2016-01-25 ENCOUNTER — Telehealth: Payer: Self-pay | Admitting: *Deleted

## 2016-01-25 NOTE — Telephone Encounter (Signed)
Return call to the pharmacy. They needed clarification as to who the prescribing physician was on the Dilaudid. Questions were answered. No further follow up needed.

## 2016-01-25 NOTE — Telephone Encounter (Signed)
Call to the pharmacy; they just needed verification of who the prescriber was on the Dilaudid RX. Questions answered.

## 2016-02-09 ENCOUNTER — Encounter: Payer: Self-pay | Admitting: Physician Assistant

## 2016-02-09 NOTE — Telephone Encounter (Signed)
Pt sent My Chart message asking for refill of his Xanax.  He says he uses it very rarely for occasional panic attacks.  This same patient notified us 11/29/15 that his pain mgmt provider was discontinuing the Xanax due to conflicts with his pain care.  I responded to him that he needs to contact his pain mgmt provider to have this refilled so as not to violate his pain contract.

## 2016-02-21 ENCOUNTER — Encounter: Payer: Self-pay | Admitting: Pain Medicine

## 2016-02-21 ENCOUNTER — Ambulatory Visit: Payer: PPO | Attending: Pain Medicine | Admitting: Pain Medicine

## 2016-02-21 VITALS — BP 154/95 | HR 77 | Temp 98.2°F | Resp 16 | Ht 72.0 in | Wt 254.0 lb

## 2016-02-21 DIAGNOSIS — M5134 Other intervertebral disc degeneration, thoracic region: Secondary | ICD-10-CM | POA: Insufficient documentation

## 2016-02-21 DIAGNOSIS — M791 Myalgia: Secondary | ICD-10-CM | POA: Diagnosis not present

## 2016-02-21 DIAGNOSIS — Z981 Arthrodesis status: Secondary | ICD-10-CM

## 2016-02-21 DIAGNOSIS — M069 Rheumatoid arthritis, unspecified: Secondary | ICD-10-CM | POA: Insufficient documentation

## 2016-02-21 DIAGNOSIS — M5030 Other cervical disc degeneration, unspecified cervical region: Secondary | ICD-10-CM | POA: Diagnosis not present

## 2016-02-21 DIAGNOSIS — M47817 Spondylosis without myelopathy or radiculopathy, lumbosacral region: Secondary | ICD-10-CM | POA: Diagnosis not present

## 2016-02-21 DIAGNOSIS — M5124 Other intervertebral disc displacement, thoracic region: Secondary | ICD-10-CM | POA: Diagnosis not present

## 2016-02-21 DIAGNOSIS — M48062 Spinal stenosis, lumbar region with neurogenic claudication: Secondary | ICD-10-CM

## 2016-02-21 DIAGNOSIS — M542 Cervicalgia: Secondary | ICD-10-CM | POA: Diagnosis not present

## 2016-02-21 DIAGNOSIS — M4806 Spinal stenosis, lumbar region: Secondary | ICD-10-CM | POA: Diagnosis not present

## 2016-02-21 DIAGNOSIS — M4316 Spondylolisthesis, lumbar region: Secondary | ICD-10-CM | POA: Diagnosis not present

## 2016-02-21 DIAGNOSIS — M47814 Spondylosis without myelopathy or radiculopathy, thoracic region: Secondary | ICD-10-CM | POA: Diagnosis not present

## 2016-02-21 DIAGNOSIS — M1990 Unspecified osteoarthritis, unspecified site: Secondary | ICD-10-CM | POA: Diagnosis not present

## 2016-02-21 DIAGNOSIS — M546 Pain in thoracic spine: Secondary | ICD-10-CM | POA: Diagnosis not present

## 2016-02-21 DIAGNOSIS — M5126 Other intervertebral disc displacement, lumbar region: Secondary | ICD-10-CM | POA: Diagnosis not present

## 2016-02-21 DIAGNOSIS — M47812 Spondylosis without myelopathy or radiculopathy, cervical region: Secondary | ICD-10-CM

## 2016-02-21 DIAGNOSIS — M5416 Radiculopathy, lumbar region: Secondary | ICD-10-CM | POA: Diagnosis not present

## 2016-02-21 DIAGNOSIS — M5136 Other intervertebral disc degeneration, lumbar region: Secondary | ICD-10-CM | POA: Diagnosis not present

## 2016-02-21 DIAGNOSIS — M47816 Spondylosis without myelopathy or radiculopathy, lumbar region: Secondary | ICD-10-CM | POA: Diagnosis not present

## 2016-02-21 DIAGNOSIS — M533 Sacrococcygeal disorders, not elsewhere classified: Secondary | ICD-10-CM

## 2016-02-21 MED ORDER — FENTANYL 50 MCG/HR TD PT72: MEDICATED_PATCH | TRANSDERMAL | 0 refills | Status: DC

## 2016-02-21 MED ORDER — HYDROMORPHONE HCL 4 MG PO TABS: ORAL_TABLET | ORAL | 0 refills | Status: DC

## 2016-02-21 MED ORDER — OXYCODONE HCL ER 30 MG PO T12A: EXTENDED_RELEASE_TABLET | ORAL | 0 refills | Status: DC

## 2016-02-21 NOTE — Progress Notes (Signed)
The patient is a 55 year old gentleman who returns to pain management for further evaluation and treatment of pain involving the neck entire back upper and lower extremity regions. The patient is with known degenerative changes of the lumbar spine and is with rheumatoid arthritis as well. The patient states that the OxyContin with Dilaudid for breakthrough pain does not appear to be as effective as his fentanyl patch with Dilaudid for breakthrough pain was. The patient stated that he wishes to resume the use of fentanyl patch at this time. We discussed patient's condition and decision was made to resume the fentanyl patch. The patient was cautioned to avoid use of Dilaudid for the first 5 days while resuming the fentanyl patch to avoid any undesirable side effects. Please caution patient regarding unresolved side effects of medications again on today's visit. The patient stated that his pain increased with standing walking twisting turning maneuvers. The patient is going as fishing trip stated that he only feels for 2 days due to the pain of the lower back and lower extremity region. We discussed patient's condition and we will proceed with lumbar epidural steroid injection at time of return appointment and we will observe response to patient resuming fentanyl patch with Dilaudid for breakthrough pain. All were understanding and agreed to suggested treatment plan.     Physical examination  There was tenderness to palpation over the paraspinal musculature region of the cervical region cervical facet region with palpation reproducing pain of mild-to-moderate degree. The patient was with limited range of motion of the cervical spine and appeared to be with unremarkable Spurling's maneuver. The patient was with moderate difficulty attempt to perform drop test. Palpation over the region of the cervical facet and thoracic facet regions reproduced pain of moderate degree in the lower thoracic region.  Palpation over the thoracic paraspinal musculature and thoracic facet region was associated with moderate discomfort in the lower thoracic paraspinal musculature region with no crepitus of the thoracic region noted. Palpation over the region of the lumbar paraspinal musculatures and lumbar facet region was attends to palpation of moderate to moderately severe degree with lateral bending rotation extension and palpation of the lumbar facets reproducing moderate to moderately severe discomfort. Palpation over the PSIS and PII S regions reproduce moderate discomfort. Straight leg raise was tolerates approximately 20 without a definite increase of pain with dorsiflexion noted. DTRs appeared to be trace at the knees with negative clonus negative Homans. No sensory deficit or dermatomal distribution detected. Abdomen was nontender with no costovertebral tenderness noted.      Assessment  Degenerative disc disease lumbar spine  T12-L1, L1 L2 facet hypertrophy L2-3 facet hypertrophy L4-5 anterior listhesis at this level with mild disc bulging and ligamentous hypertrophy with moderate bilateral L4 foraminal stenosis, L5-S1 facet hypertrophy  Lumbar facet syndrome  Lumbar stenosis with neurogenic claudication  Degenerative disc disease thoracic spine T11-12 circumferential disc bulge T10-11 partially visible degenerative changes of the thoracic  Degenerative disc disease cervical spine  Degenerative joint disease  Rheumatoid arthritis      PLAN   Continue present medications Percocet and MS Contin  Lumbar epidural steroid injection to be performed at time of return appointment  F/U PCP Dr. Doy Hutching for evaluation of  BP, swelling of lower extremities, and general medical  condition  F/U surgical evaluation. May consider pending follow-up evaluations  F/U neurological evaluation with Dr. Manuella Ghazi  F/U vascular evaluation Follow up with Dr.Dew   May consider radiofrequency rhizolysis or  intraspinal  procedures pending response to present treatment and F/U evaluation . May consider repeating radiofrequency as discussed  Patient to call [facility] should patient have concerns prior to scheduled return appointment.

## 2016-02-21 NOTE — Progress Notes (Signed)
Safety precautions to be maintained throughout the outpatient stay will include: orient to surroundings, keep bed in low position, maintain call bell within reach at all times, provide assistance with transfer out of bed and ambulation.  

## 2016-02-21 NOTE — Patient Instructions (Addendum)
PLAN   Continue present medications Dilaudid   and  RESUME   FENTANYL PATCH   DO NOT TAKE ANY DILAUDID FOR THE FIRST 5 DAYS when you begin wearing the fentanyl patch STOP OXYCONTIN   CAUTION  medications can cause significant side effects Need to decrease and discontinue the use of Xanax as discussed. Please discuss this with [name] as mentioned CAUTION   Medications (OxyContin Dilaudid and Xanax) can cause respiratory depression and cause you to stop breathing, cause excessive sedation, cause confusion and other side effects.  Exercise extreme caution when taking medication and call EMS or go to the Emergency Department immediately if you develop any of these symptoms  Lumbar epidural steroid injection to be performed at time of return appointment   F/U PCP Dr.Shah or [name]  for evaluation of  BP and general medical condition  Also remember to discuss decreasing Xanax and discontinuing the use of Xanax as previously mentioned  F/U surgical evaluation.   F/U neurological evaluation May consider  PNCV/EMG studies and other studies pending follow-up evaluations  F/U with your rheumatologist as we discussed  May consider radiofrequency procedures and implantation devices and other treatment pending follow-up evaluations  Patient is to call pain management for concerns regarding condition prior to scheduled return appointmentEpidural Steroid Injection An epidural steroid injection is given to relieve pain in your neck, back, or legs that is caused by the irritation or swelling of a nerve root. This procedure involves injecting a steroid and numbing medicine (anesthetic) into the epidural space. The epidural space is the space between the outer covering of your spinal cord and the bones that form your backbone (vertebra).  LET [facility] CARE PROVIDER KNOW ABOUT:   Any allergies you have.  All medicines you are taking, including vitamins, herbs, eye drops, creams, and over-the-counter  medicines such as aspirin.  Previous problems you or members of your family have had with the use of anesthetics.  Any blood disorders or blood clotting disorders you have.  Previous surgeries you have had.  Medical conditions you have. RISKS AND COMPLICATIONS Generally, this is a safe procedure. However, as with any procedure, complications can occur. Possible complications of epidural steroid injection include:  Headache.  Bleeding.  Infection.  Allergic reaction to the medicines.  Damage to your nerves. The response to this procedure depends on the underlying cause of the pain and its duration. People who have long-term (chronic) pain are less likely to benefit from epidural steroids than are those people whose pain comes on strong and suddenly. BEFORE THE PROCEDURE   Ask your health care provider about changing or stopping your regular medicines. You may be advised to stop taking blood-thinning medicines a few days before the procedure.  You may be given medicines to reduce anxiety.  Arrange for someone to take you home after the procedure. PROCEDURE   You will remain awake during the procedure. You may receive medicine to make you relaxed.  You will be asked to lie on your stomach.  The injection site will be cleaned.  The injection site will be numbed with a medicine (local anesthetic).  A needle will be injected through your skin into the epidural space.  Your health care provider will use an X-ray machine to ensure that the steroid is delivered closest to the affected nerve. You may have minimal discomfort at this time.  Once the needle is in the right position, the local anesthetic and the steroid will be injected into  the epidural space.  The needle will then be removed and a bandage will be applied to the injection site. AFTER THE PROCEDURE   You may be monitored for a short time before you go home.  You may feel weakness or numbness in your arm or leg,  which disappears within hours.  You may be allowed to eat, drink, and take your regular medicine.  You may have soreness at the site of the injection.   This information is not intended to replace advice given to you by your health care provider. Make sure you discuss any questions you have with your health care provider.   Document Released: 09/24/2007 Document Revised: 02/17/2013 Document Reviewed: 12/04/2012 Elsevier Interactive Patient Education 2016 [place]  What are the risk, side effects and possible complications? Generally speaking, most procedures are safe.  However, with any procedure there are risks, side effects, and the possibility of complications.  The risks and complications are dependent upon the sites that are lesioned, or the type of nerve block to be performed.  The closer the procedure is to the spine, the more serious the risks are.  Great care is taken when placing the radio frequency needles, block needles or lesioning probes, but sometimes complications can occur. 1. Infection: Any time there is an injection through the skin, there is a risk of infection.  This is why sterile conditions are used for these blocks.  There are four possible types of infection. 1. Localized skin infection. 2. Central Nervous System Infection-This can be in the form of Meningitis, which can be deadly. 3. Epidural Infections-This can be in the form of an epidural abscess, which can cause pressure inside of the spine, causing compression of the spinal cord with subsequent paralysis. This would require an emergency surgery to decompress, and there are no guarantees that the patient would recover from the paralysis. 4. Discitis-This is an infection of the intervertebral discs.  It occurs in about 1% of discography procedures.  It is difficult to treat and it may lead to surgery.        2. Pain: the needles have to go through skin and soft tissues, will cause  soreness.       3. Damage to internal structures:  The nerves to be lesioned may be near blood vessels or    other nerves which can be potentially damaged.       4. Bleeding: Bleeding is more common if the patient is taking blood thinners such as  aspirin, Coumadin, Ticiid, Plavix, etc., or if he/she have some genetic predisposition  such as hemophilia. Bleeding into the spinal canal can cause compression of the spinal  cord with subsequent paralysis.  This would require an emergency surgery to  decompress and there are no guarantees that the patient would recover from the  paralysis.       5. Pneumothorax:  Puncturing of a lung is a possibility, every time a needle is introduced in  the area of the chest or upper back.  Pneumothorax refers to free air around the  collapsed lung(s), inside of the thoracic cavity (chest cavity).  Another two possible  complications related to a similar event would include: Hemothorax and Chylothorax.   These are variations of the Pneumothorax, where instead of air around the collapsed  lung(s), you may have blood or chyle, respectively.       6. Spinal headaches: They may occur with any procedures in the area of the  spine.       7. Persistent CSF (Cerebro-Spinal Fluid) leakage: This is a rare problem, but may occur  with prolonged intrathecal or epidural catheters either due to the formation of a fistulous  track or a dural tear.       8. Nerve damage: By working so close to the spinal cord, there is always a possibility of  nerve damage, which could be as serious as a permanent spinal cord injury with  paralysis.       9. Death:  Although rare, severe deadly allergic reactions known as "Anaphylactic  reaction" can occur to any of the medications used.      10. Worsening of the symptoms:  We can always make thing worse.  What are the chances of something like this happening? Chances of any of this occuring are extremely low.  By statistics, you have more of a chance of  getting killed in a motor vehicle accident: while driving to the hospital than any of the above occurring .  Nevertheless, you should be aware that they are possibilities.  In general, it is similar to taking a shower.  Everybody knows that you can slip, hit your head and get killed.  Does that mean that you should not shower again?  Nevertheless always keep in mind that statistics do not mean anything if you happen to be on the wrong side of them.  Even if a procedure has a 1 (one) in a 1,000,000 (million) chance of going wrong, it you happen to be that one..Also, keep in mind that by statistics, you have more of a chance of having something go wrong when taking medications.  Who should not have this procedure? If you are on a blood thinning medication (e.g. Coumadin, Plavix, see list of "Blood Thinners"), or if you have an active infection going on, you should not have the procedure.  If you are taking any blood thinners, please inform your physician.  How should I prepare for this procedure?  Do not eat or drink anything at least six hours prior to the procedure.  Bring a driver with you .  It cannot be a taxi.  Come accompanied by an adult that can drive you back, and that is strong enough to help you if your legs get weak or numb from the local anesthetic.  Take all of your medicines the morning of the procedure with just enough water to swallow them.  If you have diabetes, make sure that you are scheduled to have your procedure done first thing in the morning, whenever possible.  If you have diabetes, take only half of your insulin dose and notify our nurse that you have done so as soon as you arrive at the clinic.  If you are diabetic, but only take blood sugar pills (oral hypoglycemic), then do not take them on the morning of your procedure.  You may take them after you have had the procedure.  Do not take aspirin or any aspirin-containing medications, at least eleven (11) days prior to  the procedure.  They may prolong bleeding.  Wear loose fitting clothing that may be easy to take off and that you would not mind if it got stained with Betadine or blood.  Do not wear any jewelry or perfume  Remove any nail coloring.  It will interfere with some of our monitoring equipment.  NOTE: Remember that this is not meant to be interpreted as a complete list of all possible complications.  Unforeseen problems may occur.  BLOOD THINNERS The following drugs contain aspirin or other products, which can cause increased bleeding during surgery and should not be taken for 2 weeks prior to and 1 week after surgery.  If you should need take something for relief of minor pain, you may take acetaminophen which is found in Tylenol,m Datril, Anacin-3 and Panadol. It is not blood thinner. The products listed below are.  Do not take any of the products listed below in addition to any listed on your instruction sheet.  A.P.C or A.P.C with Codeine Codeine Phosphate Capsules #3 Ibuprofen Ridaura  ABC compound Congesprin Imuran rimadil  Advil Cope Indocin Robaxisal  Alka-Seltzer Effervescent Pain Reliever and Antacid Coricidin or Coricidin-D  Indomethacin Rufen  Alka-Seltzer plus Cold Medicine Cosprin Ketoprofen S-A-C Tablets  Anacin Analgesic Tablets or Capsules Coumadin Korlgesic Salflex  Anacin Extra Strength Analgesic tablets or capsules CP-2 Tablets Lanoril Salicylate  Anaprox Cuprimine Capsules Levenox Salocol  Anexsia-D Dalteparin Magan Salsalate  Anodynos Darvon compound Magnesium Salicylate Sine-off  Ansaid Dasin Capsules Magsal Sodium Salicylate  Anturane Depen Capsules Marnal Soma  APF Arthritis pain formula Dewitt's Pills Measurin Stanback  Argesic Dia-Gesic Meclofenamic Sulfinpyrazone  Arthritis Bayer Timed Release Aspirin Diclofenac Meclomen Sulindac  Arthritis pain formula Anacin Dicumarol Medipren Supac  Analgesic (Safety coated) Arthralgen Diffunasal Mefanamic Suprofen  Arthritis  Strength Bufferin Dihydrocodeine Mepro Compound Suprol  Arthropan liquid Dopirydamole Methcarbomol with Aspirin Synalgos  ASA tablets/Enseals Disalcid Micrainin Tagament  Ascriptin Doan's Midol Talwin  Ascriptin A/D Dolene Mobidin Tanderil  Ascriptin Extra Strength Dolobid Moblgesic Ticlid  Ascriptin with Codeine Doloprin or Doloprin with Codeine Momentum Tolectin  Asperbuf Duoprin Mono-gesic Trendar  Aspergum Duradyne Motrin or Motrin IB Triminicin  Aspirin plain, buffered or enteric coated Durasal Myochrisine Trigesic  Aspirin Suppositories Easprin Nalfon Trillsate  Aspirin with Codeine Ecotrin Regular or Extra Strength Naprosyn Uracel  Atromid-S Efficin Naproxen Ursinus  Auranofin Capsules Elmiron Neocylate Vanquish  Axotal Emagrin Norgesic Verin  Azathioprine Empirin or Empirin with Codeine Normiflo Vitamin E  Azolid Emprazil Nuprin Voltaren  Bayer Aspirin plain, buffered or children's or timed BC Tablets or powders Encaprin Orgaran Warfarin Sodium  Buff-a-Comp Enoxaparin Orudis Zorpin  Buff-a-Comp with Codeine Equegesic Os-Cal-Gesic   Buffaprin Excedrin plain, buffered or Extra Strength Oxalid   Bufferin Arthritis Strength Feldene Oxphenbutazone   Bufferin plain or Extra Strength Feldene Capsules Oxycodone with Aspirin   Bufferin with Codeine Fenoprofen Fenoprofen Pabalate or Pabalate-SF   Buffets II Flogesic Panagesic   Buffinol plain or Extra Strength Florinal or Florinal with Codeine Panwarfarin   Buf-Tabs Flurbiprofen Penicillamine   Butalbital Compound Four-way cold tablets Penicillin   Butazolidin Fragmin Pepto-Bismol   Carbenicillin Geminisyn Percodan   Carna Arthritis Reliever Geopen Persantine   Carprofen Gold's salt Persistin   Chloramphenicol Goody's Phenylbutazone   Chloromycetin Haltrain Piroxlcam   Clmetidine heparin Plaquenil   Cllnoril Hyco-pap Ponstel   Clofibrate Hydroxy chloroquine Propoxyphen         Before stopping any of these medications, be sure to  consult the physician who ordered them.  Some, such as Coumadin (Warfarin) are ordered to prevent or treat serious conditions such as "deep thrombosis", "pumonary embolisms", and other heart problems.  The amount of time that you may need off of the medication may also vary with the medication and the reason for which you were taking it.  If you are taking any of these medications, please make sure you notify your pain physician before you undergo any procedures.

## 2016-03-13 ENCOUNTER — Other Ambulatory Visit: Payer: Self-pay | Admitting: Pain Medicine

## 2016-03-13 DIAGNOSIS — M5136 Other intervertebral disc degeneration, lumbar region: Secondary | ICD-10-CM | POA: Diagnosis not present

## 2016-03-13 DIAGNOSIS — M5416 Radiculopathy, lumbar region: Secondary | ICD-10-CM | POA: Diagnosis not present

## 2016-03-20 ENCOUNTER — Encounter: Payer: Self-pay | Admitting: Physician Assistant

## 2016-03-20 ENCOUNTER — Ambulatory Visit: Payer: PPO | Admitting: Pain Medicine

## 2016-03-20 ENCOUNTER — Ambulatory Visit (INDEPENDENT_AMBULATORY_CARE_PROVIDER_SITE_OTHER): Payer: PPO | Admitting: Physician Assistant

## 2016-03-20 VITALS — BP 150/86 | HR 86 | Temp 97.9°F | Resp 18 | Wt 267.0 lb

## 2016-03-20 DIAGNOSIS — M5382 Other specified dorsopathies, cervical region: Secondary | ICD-10-CM | POA: Diagnosis not present

## 2016-03-20 DIAGNOSIS — M533 Sacrococcygeal disorders, not elsewhere classified: Secondary | ICD-10-CM | POA: Diagnosis not present

## 2016-03-20 DIAGNOSIS — M5116 Intervertebral disc disorders with radiculopathy, lumbar region: Secondary | ICD-10-CM

## 2016-03-20 DIAGNOSIS — M5136 Other intervertebral disc degeneration, lumbar region: Secondary | ICD-10-CM

## 2016-03-20 DIAGNOSIS — M47816 Spondylosis without myelopathy or radiculopathy, lumbar region: Secondary | ICD-10-CM

## 2016-03-20 DIAGNOSIS — M47812 Spondylosis without myelopathy or radiculopathy, cervical region: Secondary | ICD-10-CM

## 2016-03-20 DIAGNOSIS — Z981 Arthrodesis status: Secondary | ICD-10-CM | POA: Diagnosis not present

## 2016-03-20 DIAGNOSIS — M4806 Spinal stenosis, lumbar region: Secondary | ICD-10-CM

## 2016-03-20 DIAGNOSIS — M545 Low back pain: Secondary | ICD-10-CM | POA: Diagnosis not present

## 2016-03-20 DIAGNOSIS — M48061 Spinal stenosis, lumbar region without neurogenic claudication: Secondary | ICD-10-CM

## 2016-03-20 DIAGNOSIS — M5030 Other cervical disc degeneration, unspecified cervical region: Secondary | ICD-10-CM

## 2016-03-20 MED ORDER — FENTANYL 50 MCG/HR TD PT72: MEDICATED_PATCH | TRANSDERMAL | 0 refills | Status: DC

## 2016-03-20 MED ORDER — HYDROMORPHONE HCL 4 MG PO TABS: ORAL_TABLET | ORAL | 0 refills | Status: AC

## 2016-03-20 MED ORDER — HYDROMORPHONE HCL 4 MG PO TABS: ORAL_TABLET | ORAL | 0 refills | Status: DC

## 2016-03-20 MED ORDER — FENTANYL 50 MCG/HR TD PT72: MEDICATED_PATCH | TRANSDERMAL | 0 refills | Status: AC

## 2016-03-20 NOTE — Progress Notes (Signed)
Patient ID: Brent Hamilton MRN: [id number], DOB: [birth year], 55 y.o. Date of Encounter: 03/20/2016, 12:28 PM    Chief Complaint:  Chief Complaint  Patient presents with  . office visit     HPI: 55 y.o. year old male says that he has been seeing Dr. Primus Bravo for pain management at [facility] for a long time. Says that he has helped him so much and has gotten him to where he can be back at work etc. However all of a sudden with no warning he has found out that Dr. Primus Bravo has left the [place] pain clinic. Dr. Primus Bravo is going to be having office/clinic in [place] and patient has an appointment to see him in [place] next Thursday. Patient states that the medication that Dr. Primus Bravo has had him on for years runs out tomorrow. Says that Dr. Ethel Rana office informed him to get enough pills from his PCP to hold him over until his follow-up appointment with Dr. Primus Bravo in [place]. Patient states that his current medications are going to run out tomorrow and his appointment with Dr. Primus Bravo in [place] is   Thursday September 28.     Home Meds:   Outpatient Medications Prior to Visit  Medication Sig Dispense Refill  . cholecalciferol (VITAMIN D) 1000 UNITS tablet Take 1,000 Units by mouth.    . Cyanocobalamin (VITAMIN B 12 PO) Take 1 tablet by mouth daily.    Marland Kitchen FIBER PO Take 1 tablet by mouth 2 (two) times daily.    Marland Kitchen lisinopril (PRINIVIL,ZESTRIL) 40 MG tablet TAKE ONE TABLET BY MOUTH ONCE DAILY 90 tablet 0  . testosterone cypionate (DEPOTESTOSTERONE CYPIONATE) 200 MG/ML injection Inject 1 mL (200 mg total) into the muscle every 14 (fourteen) days. 10 mL 1  . fentaNYL (DURAGESIC - DOSED MCG/HR) 50 MCG/HR Apply one patch to skin every 2 days if tolerated       NO  OXYCONTIN 15 patch 0  . HYDROmorphone (DILAUDID) 4 MG tablet Limit one half to one tablet by mouth per day  or twice per day  for breakthrough pain if tolerated while wearing fentanyl patch 60 tablet 0  . cefUROXime (CEFTIN) 250 MG tablet  Take 1 tablet (250 mg total) by mouth 2 (two) times daily with a meal. (Patient not taking: Reported on 03/20/2016) 14 tablet 0  . oxyCODONE 30 MG 12 hr tablet Limit 1 tablet by mouth every 12 hours if tolerated. PLEASE NOTE    Pill is 30 mg (Patient not taking: Reported on 03/20/2016) 60 each 0   Facility-Administered Medications Prior to Visit  Medication Dose Route Frequency Provider Last Rate Last Dose  . bupivacaine (PF) (MARCAINE) 0.25 % injection 30 mL  30 mL Other Once Mohammed Kindle, MD      . ceFAZolin (ANCEF) IVPB 1 g/50 mL premix  1 g Intravenous Once Mohammed Kindle, MD      . ceFAZolin (ANCEF) IVPB 1 g/50 mL premix  1 g Intravenous Once Mohammed Kindle, MD      . fentaNYL (SUBLIMAZE) injection 100 mcg  100 mcg Intravenous Once Mohammed Kindle, MD      . lactated ringers infusion 1,000 mL  1,000 mL Intravenous Continuous Mohammed Kindle, MD      . lactated ringers infusion 1,000 mL  1,000 mL Intravenous Continuous Mohammed Kindle, MD      . lactated ringers infusion 1,000 mL  1,000 mL Intravenous Continuous Mohammed Kindle, MD      . lactated ringers infusion 1,000 mL  1,000 mL Intravenous Continuous Mohammed Kindle, MD      . lidocaine (PF) (XYLOCAINE) 1 % injection 10 mL  10 mL Subcutaneous Once Mohammed Kindle, MD      . midazolam (VERSED) 5 MG/5ML injection 5 mg  5 mg Intravenous Once Mohammed Kindle, MD      . midazolam (VERSED) 5 MG/5ML injection 5 mg  5 mg Intravenous Once Mohammed Kindle, MD      . orphenadrine (NORFLEX) injection 60 mg  60 mg Intramuscular Once Mohammed Kindle, MD      . orphenadrine (NORFLEX) injection 60 mg  60 mg Intramuscular Once Mohammed Kindle, MD      . triamcinolone acetonide (KENALOG-40) injection 40 mg  40 mg Other Once Mohammed Kindle, MD        Allergies:  Allergies  Allergen Reactions  . Flonase [Fluticasone] Swelling, Anaphylaxis and Rash  . Oxycodone Swelling  . Morphine And Related Itching and Rash    Rash developed after about 10 days of taking the tablets        Review of Systems: See HPI for pertinent ROS. All other ROS negative.    Physical Exam: Blood pressure (!) 150/86, pulse 86, temperature 97.9 F (36.6 C), temperature source Oral, resp. rate 18, weight 267 lb (121.1 kg)., Body mass index is 36.21 kg/m. General:  Overweight WM. Appears in no acute distress. Neck: Supple. No thyromegaly. No lymphadenopathy. Lungs: Clear bilaterally to auscultation without wheezes, rales, or rhonchi. Breathing is unlabored. Heart: Regular rhythm. No murmurs, rubs, or gallops. Msk:  Strength and tone normal for age. Extremities/Skin: Warm and dry.  Neuro: Alert and oriented X 3. Moves all extremities spontaneously. Gait is normal. CNII-XII grossly in tact. Psych:  Responds to questions appropriately with a normal affect.     ASSESSMENT AND PLAN:  55 y.o. year old male with  1. Lumbar disc disease with radiculopathy 2. DDD (degenerative disc disease), lumbar 3. DDD (degenerative disc disease), cervical 4. Cervical facet syndrome 5. Sacroiliac joint dysfunction of both sides 6. Lumbar canal stenosis 7. Facet syndrome, lumbar 8. Status post cervical spinal fusion - fentaNYL (DURAGESIC - DOSED MCG/HR) 50 MCG/HR; Apply one patch to skin every 2 days if tolerated       NO  OXYCONTIN  Dispense: 15 patch; Refill: 0 - HYDROmorphone (DILAUDID) 4 MG tablet; Limit one half to one tablet by mouth per day  or twice per day  for breakthrough pain if tolerated while wearing fentanyl patch  Dispense: 60 tablet; Refill: 0  Initially, I was going to give patient just enough of each medication to last him until his appointment with Dr. Primus Bravo Thursday, September 28. This is all the patient requested. However, I do not want him to have to pay to get that one week supply of each medication and then have to pay again to pick up medications from Dr. Ethel Rana next Rx. Therefore, I went ahead and printed a one-month supply for each medication. He is going to be open and  honest with Dr. Primus Bravo and inform him of his appointment with me here today and of the 2 prescriptions that I gave for him today and the quantity for each. He is very well aware of usual rules regarding narcotics being prescribed by only 1 provider etc. However in this situation he says that Dr. Ethel Rana office has informed him to get this one set of prescriptions from his PCP as Dr. Primus Bravo has lots of patients in a similar situation and that  Dr. Primus Bravo currently not available to fill all of those prescriptions immediately/until scheduled visit in [facility] location.  Marin Olp [place], [place], [facility] 03/20/2016 12:28 PM

## 2016-03-24 ENCOUNTER — Other Ambulatory Visit: Payer: Self-pay | Admitting: Pain Medicine

## 2016-03-26 ENCOUNTER — Other Ambulatory Visit: Payer: Self-pay | Admitting: Pain Medicine

## 2016-03-26 DIAGNOSIS — M791 Myalgia: Secondary | ICD-10-CM | POA: Diagnosis not present

## 2016-03-26 DIAGNOSIS — M5416 Radiculopathy, lumbar region: Secondary | ICD-10-CM | POA: Diagnosis not present

## 2016-03-26 DIAGNOSIS — M47817 Spondylosis without myelopathy or radiculopathy, lumbosacral region: Secondary | ICD-10-CM | POA: Diagnosis not present

## 2016-03-26 DIAGNOSIS — M533 Sacrococcygeal disorders, not elsewhere classified: Secondary | ICD-10-CM | POA: Diagnosis not present

## 2016-04-15 ENCOUNTER — Ambulatory Visit (INDEPENDENT_AMBULATORY_CARE_PROVIDER_SITE_OTHER): Payer: PPO | Admitting: Physician Assistant

## 2016-04-15 VITALS — BP 138/82 | HR 99 | Temp 97.9°F | Resp 18 | Wt 264.0 lb

## 2016-04-15 DIAGNOSIS — R5383 Other fatigue: Secondary | ICD-10-CM

## 2016-04-15 DIAGNOSIS — R61 Generalized hyperhidrosis: Secondary | ICD-10-CM

## 2016-04-15 DIAGNOSIS — R509 Fever, unspecified: Secondary | ICD-10-CM | POA: Diagnosis not present

## 2016-04-15 LAB — COMPLETE METABOLIC PANEL WITH GFR
ALBUMIN: 4.1 g/dL (ref 3.6–5.1)
ALK PHOS: 74 U/L (ref 40–115)
ALT: 42 U/L (ref 9–46)
AST: 25 U/L (ref 10–35)
BUN: 7 mg/dL (ref 7–25)
CALCIUM: 9.2 mg/dL (ref 8.6–10.3)
CO2: 29 mmol/L (ref 20–31)
Chloride: 98 mmol/L (ref 98–110)
Creat: 1.02 mg/dL (ref 0.70–1.33)
GFR, Est African American: >89 mL/min (ref >=60)
GFR, Est Non African American: 81 mL/min (ref >=60)
GLUCOSE: 78 mg/dL (ref 70–99)
Potassium: 5 mmol/L (ref 3.5–5.3)
Sodium: 137 mmol/L (ref 135–146)
TOTAL PROTEIN: 6.8 g/dL (ref 6.1–8.1)
Total Bilirubin: 0.5 mg/dL (ref 0.2–1.2)

## 2016-04-15 LAB — CBC WITH DIFFERENTIAL/PLATELET
BASOS PCT: 0 %
Basophils Absolute: 0 cells/uL (ref 0–200)
Eosinophils Absolute: 0 cells/uL — ABNORMAL LOW (ref 15–500)
Eosinophils Relative: 0 %
HEMATOCRIT: 48.6 % (ref 38.5–50.0)
HEMOGLOBIN: 16.6 g/dL (ref 13.0–17.0)
LYMPHS ABS: 1955 {cells}/uL (ref 850–3900)
Lymphocytes Relative: 17 %
MCH: 31.9 pg (ref 27.0–33.0)
MCHC: 34.2 g/dL (ref 32.0–36.0)
MCV: 93.3 fL (ref 80.0–100.0)
MONO ABS: 1150 {cells}/uL — AB (ref 200–950)
MPV: 9.1 fL (ref 7.5–12.5)
Monocytes Relative: 10 %
NEUTROS ABS: 8395 {cells}/uL — AB (ref 1500–7800)
NEUTROS PCT: 73 %
Platelets: 304 10*3/uL (ref 140–400)
RBC: 5.21 MIL/uL (ref 4.20–5.80)
RDW: 13.8 % (ref 11.0–15.0)
WBC: 11.5 10*3/uL — AB (ref 3.8–10.8)

## 2016-04-15 LAB — TSH: TSH: 1.36 mIU/L (ref 0.40–4.50)

## 2016-04-15 NOTE — Progress Notes (Signed)
Patient ID: Brent Hamilton MRN: [id number], DOB: Mar 11, 1961, 55 y.o. Date of Encounter: @DATE @  Chief Complaint:  Chief Complaint  Patient presents with  . office visit    low grade fever, sweating increase in appetitie    HPI: 55 y.o. year old white male  presents with above.   Says that he has been having sweats with minimal activity. He can just walk to take the trash outside and is covered in sweat. Also having sweats at night. He also is having increased fatigue and can fall asleep very easily. The other day he was just sitting in the chair and in just a few minutes was asleep. He has also been having some cramps in the calves of his legs.  Reviewed with him that I thought he had recent visit with me with similar symptoms and that it was decided that symptoms were secondary to medicine/treatment from the pain clinic.  He says that when he had those symptoms he had recently added Wellbutrin and that he went off of the Wellbutrin. Says that he has discontinued the Wellbutrin and has stayed off of that. Current medications from the pain clinic are the same as they have been for years. He had thought that it was just the addition of the Wellbutrin and had thought that the symptoms were better after he stopped the Wellbutrin but is realizing the symptoms are persisting. Wants to check labs to check  thyroid etc.  Has had no cough. Has had no abdominal pain. No vomiting or diarrhea. No burning with urination.   Past Medical History:  Diagnosis Date  . Arthritis   . Gout   . Hyperlipidemia   . Hypertension   . Neuromuscular disorder ([place])      Home Meds: Outpatient Medications Prior to Visit  Medication Sig Dispense Refill  . cholecalciferol (VITAMIN D) 1000 UNITS tablet Take 1,000 Units by mouth.    . fentaNYL (DURAGESIC - DOSED MCG/HR) 50 MCG/HR Apply one patch to skin every 2 days if tolerated       NO  OXYCONTIN 15 patch 0  . FIBER PO Take 1 tablet by mouth 2 (two) times  daily.    Marland Kitchen HYDROmorphone (DILAUDID) 4 MG tablet Limit one half to one tablet by mouth per day  or twice per day  for breakthrough pain if tolerated while wearing fentanyl patch 60 tablet 0  . lisinopril (PRINIVIL,ZESTRIL) 40 MG tablet TAKE ONE TABLET BY MOUTH ONCE DAILY 90 tablet 0  . oxyCODONE 30 MG 12 hr tablet Limit 1 tablet by mouth every 12 hours if tolerated. PLEASE NOTE    Pill is 30 mg 60 each 0  . testosterone cypionate (DEPOTESTOSTERONE CYPIONATE) 200 MG/ML injection Inject 1 mL (200 mg total) into the muscle every 14 (fourteen) days. 10 mL 1  . cefUROXime (CEFTIN) 250 MG tablet Take 1 tablet (250 mg total) by mouth 2 (two) times daily with a meal. (Patient not taking: Reported on 04/15/2016) 14 tablet 0  . Cyanocobalamin (VITAMIN B 12 PO) Take 1 tablet by mouth daily.     Facility-Administered Medications Prior to Visit  Medication Dose Route Frequency Provider Last Rate Last Dose  . bupivacaine (PF) (MARCAINE) 0.25 % injection 30 mL  30 mL Other Once Mohammed Kindle, MD      . ceFAZolin (ANCEF) IVPB 1 g/50 mL premix  1 g Intravenous Once Mohammed Kindle, MD      . ceFAZolin (ANCEF) IVPB 1 g/50 mL premix  1 g Intravenous Once Mohammed Kindle, MD      . fentaNYL (SUBLIMAZE) injection 100 mcg  100 mcg Intravenous Once Mohammed Kindle, MD      . lactated ringers infusion 1,000 mL  1,000 mL Intravenous Continuous Mohammed Kindle, MD      . lactated ringers infusion 1,000 mL  1,000 mL Intravenous Continuous Mohammed Kindle, MD      . lactated ringers infusion 1,000 mL  1,000 mL Intravenous Continuous Mohammed Kindle, MD      . lactated ringers infusion 1,000 mL  1,000 mL Intravenous Continuous Mohammed Kindle, MD      . lidocaine (PF) (XYLOCAINE) 1 % injection 10 mL  10 mL Subcutaneous Once Mohammed Kindle, MD      . midazolam (VERSED) 5 MG/5ML injection 5 mg  5 mg Intravenous Once Mohammed Kindle, MD      . midazolam (VERSED) 5 MG/5ML injection 5 mg  5 mg Intravenous Once Mohammed Kindle, MD      .  orphenadrine (NORFLEX) injection 60 mg  60 mg Intramuscular Once Mohammed Kindle, MD      . orphenadrine (NORFLEX) injection 60 mg  60 mg Intramuscular Once Mohammed Kindle, MD      . triamcinolone acetonide (KENALOG-40) injection 40 mg  40 mg Other Once Mohammed Kindle, MD        Allergies:  Allergies  Allergen Reactions  . Flonase [Fluticasone] Swelling, Anaphylaxis and Rash  . Oxycodone Swelling  . Morphine And Related Itching and Rash    Rash developed after about 10 days of taking the tablets    Social History   Social History  . Marital status: Married    Spouse name: N/A  . Number of children: N/A  . Years of education: N/A   Occupational History  . Not on file.   Social History Main Topics  . Smoking status: Former Smoker    Packs/day: 0.50    Years: 8.00    Types: Cigarettes    Quit date: 04/28/1992  . Smokeless tobacco: Not on file  . Alcohol use 1.2 oz/week    2 Glasses of wine per week  . Drug use: No  . Sexual activity: Yes    Birth control/ protection: None   Other Topics Concern  . Not on file   Social History Narrative  . No narrative on file    Family History  Problem Relation Age of Onset  . Arthritis Mother   . Heart disease Mother   . Hyperlipidemia Mother   . Cancer Father   . Alcohol abuse Father      Review of Systems:  See HPI for pertinent ROS. All other ROS negative.    Physical Exam: Blood pressure 138/82, pulse 99, temperature 97.9 F (36.6 C), temperature source Oral, resp. rate 18, weight 264 lb (119.7 kg), SpO2 97 %., Body mass index is 35.8 kg/m. General: Obese WM. Appears in no acute distress.  Neck: Supple. No thyromegaly. No lymphadenopathy. Lungs: Clear bilaterally to auscultation without wheezes, rales, or rhonchi. Breathing is unlabored. Heart: RRR with S1 S2. No murmurs, rubs, or gallops. Abdomen: Soft, non-tender, non-distended with normoactive bowel sounds. No hepatomegaly. No rebound/guarding. No obvious abdominal  masses. Musculoskeletal:  Strength and tone normal for age. Extremities/Skin: Warm and dry.  Neuro: Alert and oriented X 3. Moves all extremities spontaneously. Gait is normal. CNII-XII grossly in tact. Psych:  Responds to questions appropriately with a normal affect.     ASSESSMENT AND PLAN:  55 y.o.  year old male with  1. Fever, unspecified fever cause - CBC with Differential/Platelet - COMPLETE METABOLIC PANEL WITH GFR - B. burgdorfi antibodies by WB - Rocky mtn spotted fvr abs pnl(IgG+IgM) - TSH - Urinalysis, Routine w reflex microscopic (not at [facility]) - Culture, blood (single) w Reflex to ID Panel - Culture, blood (single) w Reflex to ID Panel  2. Night sweats - CBC with Differential/Platelet - COMPLETE METABOLIC PANEL WITH GFR - B. burgdorfi antibodies by WB - Rocky mtn spotted fvr abs pnl(IgG+IgM) - TSH - Urinalysis, Routine w reflex microscopic (not at [facility]) - Culture, blood (single) w Reflex to ID Panel - Culture, blood (single) w Reflex to ID Panel  3. Fatigue, unspecified type - CBC with Differential/Platelet - COMPLETE METABOLIC PANEL WITH GFR - B. burgdorfi antibodies by WB - Rocky mtn spotted fvr abs pnl(IgG+IgM) - TSH - Urinalysis, Routine w reflex microscopic (not at [facility]) - Culture, blood (single) w Reflex to ID Panel - Culture, blood (single) w Reflex to ID Panel  Will check labs to evaluate for underlying causes of his symptoms.  If labs are normal then suspect his symptoms are secondary to fentanyl and other pain medications.  Signed, [address] [place], [place], BSF[facility] 04/15/2016 2:00 PM

## 2016-04-16 ENCOUNTER — Other Ambulatory Visit: Payer: Self-pay | Admitting: Physician Assistant

## 2016-04-16 LAB — URINALYSIS, ROUTINE W REFLEX MICROSCOPIC
Bilirubin Urine: NEGATIVE
Glucose, UA: NEGATIVE
HGB URINE DIPSTICK: NEGATIVE
KETONES UR: NEGATIVE
LEUKOCYTES UA: NEGATIVE
NITRITE: NEGATIVE
PH: 6 (ref 5.0–8.0)
Protein, ur: NEGATIVE
SPECIFIC GRAVITY, URINE: 1.013 (ref 1.001–1.035)

## 2016-04-16 NOTE — Telephone Encounter (Signed)
Rx refilled per protocol 

## 2016-04-17 LAB — LYME ABY, WSTRN BLT IGG & IGM W/BANDS
B BURGDORFERI IGM ABS (IB): NEGATIVE
B burgdorferi IgG Abs (IB): NEGATIVE
LYME DISEASE 18 KD IGG: NONREACTIVE
LYME DISEASE 23 KD IGG: NONREACTIVE
LYME DISEASE 23 KD IGM: NONREACTIVE
LYME DISEASE 39 KD IGM: NONREACTIVE
LYME DISEASE 41 KD IGG: NONREACTIVE
LYME DISEASE 58 KD IGG: NONREACTIVE
LYME DISEASE 66 KD IGG: NONREACTIVE
Lyme Disease 28 kD IgG: NONREACTIVE
Lyme Disease 30 kD IgG: NONREACTIVE
Lyme Disease 39 kD IgG: NONREACTIVE
Lyme Disease 41 kD IgM: REACTIVE — AB
Lyme Disease 45 kD IgG: NONREACTIVE
Lyme Disease 93 kD IgG: NONREACTIVE

## 2016-04-18 ENCOUNTER — Telehealth: Payer: Self-pay

## 2016-04-18 NOTE — Telephone Encounter (Signed)
Pt called in regards to lab results 

## 2016-04-19 LAB — ROCKY MTN SPOTTED FVR ABS PNL(IGG+IGM)
RMSF IGG: NOT DETECTED
RMSF IgM: NOT DETECTED

## 2016-04-22 ENCOUNTER — Other Ambulatory Visit: Payer: Self-pay | Admitting: Pain Medicine

## 2016-04-22 LAB — CULTURE, BLOOD (SINGLE): ORGANISM ID, BACTERIA: NO GROWTH

## 2016-04-23 DIAGNOSIS — M5416 Radiculopathy, lumbar region: Secondary | ICD-10-CM | POA: Diagnosis not present

## 2016-04-23 DIAGNOSIS — M47817 Spondylosis without myelopathy or radiculopathy, lumbosacral region: Secondary | ICD-10-CM | POA: Diagnosis not present

## 2016-04-23 DIAGNOSIS — M533 Sacrococcygeal disorders, not elsewhere classified: Secondary | ICD-10-CM | POA: Diagnosis not present

## 2016-04-23 DIAGNOSIS — M791 Myalgia: Secondary | ICD-10-CM | POA: Diagnosis not present

## 2016-05-08 ENCOUNTER — Emergency Department (HOSPITAL_COMMUNITY)
Admission: EM | Admit: 2016-05-08 | Discharge: 2016-05-08 | Disposition: A | Discharge status: Home / Self Care | Payer: PPO | Attending: Emergency Medicine | Admitting: Emergency Medicine

## 2016-05-08 ENCOUNTER — Other Ambulatory Visit: Payer: Self-pay | Admitting: Physician Assistant

## 2016-05-08 ENCOUNTER — Emergency Department (HOSPITAL_COMMUNITY): Payer: PPO

## 2016-05-08 ENCOUNTER — Encounter (HOSPITAL_COMMUNITY): Payer: Self-pay | Admitting: Emergency Medicine

## 2016-05-08 ENCOUNTER — Other Ambulatory Visit: Payer: Self-pay | Admitting: Pain Medicine

## 2016-05-08 DIAGNOSIS — R1084 Generalized abdominal pain: Secondary | ICD-10-CM

## 2016-05-08 DIAGNOSIS — I10 Essential (primary) hypertension: Secondary | ICD-10-CM | POA: Insufficient documentation

## 2016-05-08 DIAGNOSIS — K5900 Constipation, unspecified: Secondary | ICD-10-CM | POA: Insufficient documentation

## 2016-05-08 DIAGNOSIS — R1013 Epigastric pain: Secondary | ICD-10-CM | POA: Diagnosis not present

## 2016-05-08 DIAGNOSIS — Z79899 Other long term (current) drug therapy: Secondary | ICD-10-CM | POA: Diagnosis not present

## 2016-05-08 DIAGNOSIS — Z87891 Personal history of nicotine dependence: Secondary | ICD-10-CM | POA: Insufficient documentation

## 2016-05-08 DIAGNOSIS — R1011 Right upper quadrant pain: Secondary | ICD-10-CM | POA: Diagnosis not present

## 2016-05-08 DIAGNOSIS — R109 Unspecified abdominal pain: Secondary | ICD-10-CM | POA: Diagnosis not present

## 2016-05-08 LAB — CBC WITH DIFFERENTIAL/PLATELET
BASOS PCT: 0 %
Basophils Absolute: 0 10*3/uL (ref 0.0–0.1)
Eosinophils Absolute: 0.1 10*3/uL (ref 0.0–0.7)
Eosinophils Relative: 1 %
HEMATOCRIT: 49.3 % (ref 39.0–52.0)
HEMOGLOBIN: 16.4 g/dL (ref 13.0–17.0)
LYMPHS PCT: 17 %
Lymphs Abs: 1.4 10*3/uL (ref 0.7–4.0)
MCH: 32.9 pg (ref 26.0–34.0)
MCHC: 33.3 g/dL (ref 30.0–36.0)
MCV: 98.8 fL (ref 78.0–100.0)
MONOS PCT: 11 %
Monocytes Absolute: 0.9 10*3/uL (ref 0.1–1.0)
NEUTROS ABS: 6.1 10*3/uL (ref 1.7–7.7)
NEUTROS PCT: 71 %
Platelets: 214 10*3/uL (ref 150–400)
RBC: 4.99 MIL/uL (ref 4.22–5.81)
RDW: 15.1 % (ref 11.5–15.5)
WBC: 8.5 10*3/uL (ref 4.0–10.5)

## 2016-05-08 LAB — COMPREHENSIVE METABOLIC PANEL
ALBUMIN: 4 g/dL (ref 3.5–5.0)
ALK PHOS: 63 U/L (ref 38–126)
ALT: 47 U/L (ref 17–63)
ANION GAP: 7 (ref 5–15)
AST: 42 U/L — ABNORMAL HIGH (ref 15–41)
BUN: 9 mg/dL (ref 6–20)
CALCIUM: 9.1 mg/dL (ref 8.9–10.3)
CHLORIDE: 98 mmol/L — AB (ref 101–111)
CO2: 29 mmol/L (ref 22–32)
Creatinine, Ser: 0.97 mg/dL (ref 0.61–1.24)
GFR calc non Af Amer: >60 mL/min (ref >60)
Glucose, Bld: 100 mg/dL — ABNORMAL HIGH (ref 65–99)
POTASSIUM: 4.4 mmol/L (ref 3.5–5.1)
SODIUM: 134 mmol/L — AB (ref 135–145)
Total Bilirubin: 0.8 mg/dL (ref 0.3–1.2)
Total Protein: 7 g/dL (ref 6.5–8.1)

## 2016-05-08 LAB — LIPASE, BLOOD: Lipase: 27 U/L (ref 11–51)

## 2016-05-08 LAB — TROPONIN I

## 2016-05-08 MED ORDER — DIPHENHYDRAMINE HCL 25 MG PO CAPS
25.0000 mg | ORAL_CAPSULE | Freq: Once | ORAL | Status: AC
  Administered 2016-05-08: 25 mg via ORAL
  Filled 2016-05-08: qty 1

## 2016-05-08 MED ORDER — ONDANSETRON HCL 4 MG/2ML IJ SOLN
4.0000 mg | Freq: Once | INTRAMUSCULAR | Status: AC
  Administered 2016-05-08: 4 mg via INTRAVENOUS
  Filled 2016-05-08: qty 2

## 2016-05-08 MED ORDER — ONDANSETRON HCL 4 MG PO TABS: 4.0000 mg | ORAL_TABLET | Freq: Once | ORAL | Status: DC

## 2016-05-08 MED ORDER — HYDROCODONE-ACETAMINOPHEN 5-325 MG PO TABS
2.0000 | ORAL_TABLET | Freq: Once | ORAL | Status: AC
  Administered 2016-05-08: 2 via ORAL
  Filled 2016-05-08: qty 2

## 2016-05-08 MED ORDER — IOPAMIDOL (ISOVUE-300) INJECTION 61%
100.0000 mL | Freq: Once | INTRAVENOUS | Status: AC | PRN
  Administered 2016-05-08: 100 mL via INTRAVENOUS

## 2016-05-08 MED ORDER — IOPAMIDOL (ISOVUE-300) INJECTION 61%
INTRAVENOUS | Status: AC
  Administered 2016-05-08: 30 mL via ORAL
  Filled 2016-05-08: qty 30

## 2016-05-08 NOTE — Discharge Instructions (Signed)
Your lab work is within normal limits. The x-ray of your chest and abdomen suggest increased stool burden. The CT scan of your abdomen is negative for any acute issue. Please use 15-30 cc of warm flavored mineral daily. Please use a fleets mineral all, daily. Please see your primary physician for additional evaluation and management.

## 2016-05-08 NOTE — ED Provider Notes (Signed)
[facility] DEPT Provider Note   CSN: [id number] Arrival date & time: 05/08/16  0857     History   Chief Complaint Chief Complaint  Patient presents with  . Abdominal Pain    HPI Brent Hamilton is a 55 y.o. male.  Patient is a 55 year old male who presents to the emergency department with a complaint of abdominal pain. The patient states that approximately 2 weeks ago he started using a probiotic. About the same time he developed problems with constipation. He states that he has not really had a regular normal stool for nearly 2 weeks. During this time he's been trying over-the-counter medications including MiraLAX, glycerin, and milk of magnesia. He states none of them seem to really be working. He's had some gas and at times does some belching, but he continues to feel bloated and distended, accompanied by nausea. He's not had any unusual weight loss. He has not seen blood in his stools. He states that prior to this probiotic there were no changes in the form of his stools on. His been no high fevers reported. He presents now for assistance with this issue.   The history is provided by the patient.  Abdominal Pain   This is a new problem. The problem occurs daily. The pain is located in the generalized abdominal region. The quality of the pain is dull. The pain is moderate. Associated symptoms include flatus, constipation and arthralgias. Pertinent negatives include dysuria, frequency and hematuria. Nothing aggravates the symptoms. Nothing relieves the symptoms. His past medical history does not include PUD, GERD or irritable bowel syndrome.    Past Medical History:  Diagnosis Date  . Arthritis   . Gout   . Hyperlipidemia   . Hypertension   . Neuromuscular disorder [facility])     Patient Active Problem List   Diagnosis Date Noted  . Depression 06/19/2015  . DDD (degenerative disc disease), lumbar 11/21/2014  . DDD (degenerative disc disease), cervical 11/21/2014  . Facet  syndrome, lumbar 11/21/2014  . Cervical facet syndrome 11/21/2014  . Status post cervical spinal fusion 11/21/2014  . Sacroiliac joint dysfunction of both sides 11/21/2014  . Anxiety 10/01/2014  . Chest pain 10/01/2014  . Chronic LBP 10/01/2014  . Claudication ([place]) 10/01/2014  . Clinical depression 10/01/2014  . Dyslipidemia 10/01/2014  . Elevated blood uric acid level 10/01/2014  . Failure of erection 10/01/2014  . Fatigue 10/01/2014  . HLD (hyperlipidemia) 10/01/2014  . Benign hypertension 10/01/2014  . BP (high blood pressure) 10/01/2014  . Leg swelling 10/01/2014  . Decreased testosterone level 10/01/2014  . Lumbar disc disease with radiculopathy 10/01/2014  . Extreme obesity ([place]) 10/01/2014  . Arthralgia of upper arm 10/01/2014  . Leg pain 10/01/2014  . Acute pharyngitis 10/01/2014  . Pharyngeal inflammation 10/01/2014  . PND (post-nasal drip) 10/01/2014  . Screening for depression 10/01/2014  . Lumbar canal stenosis 10/01/2014  . Avitaminosis D 10/01/2014    Past Surgical History:  Procedure Laterality Date  . BACK SURGERY     c6/c7 fusion  . CERVICAL DISC SURGERY     c6-c7 fusion  . CHOLECYSTECTOMY  05/01/2012   Procedure: LAPAROSCOPIC CHOLECYSTECTOMY;  Surgeon: Jamesetta So, MD;  Location: [facility];  Service: General;  Laterality: N/A;  . INNER EAR SURGERY     recently-left  . KNEE SURGERY     arthoscopy x3-left knee  . SPINE SURGERY    . ulnar nerve surgeries     bilateral-3 on right and 1 on left  Home Medications    Prior to Admission medications   Medication Sig Start Date End Date Taking? Authorizing Provider  cefUROXime (CEFTIN) 250 MG tablet Take 1 tablet (250 mg total) by mouth 2 (two) times daily with a meal. Patient not taking: Reported on 04/15/2016 12/06/15   Mohammed Kindle, MD  cholecalciferol (VITAMIN D) 1000 UNITS tablet Take 1,000 Units by mouth.    Historical Provider, MD  Cyanocobalamin (VITAMIN B 12 PO) Take 1 tablet by mouth  daily.    Historical Provider, MD  fentaNYL (DURAGESIC - DOSED MCG/HR) 50 MCG/HR Apply one patch to skin every 2 days if tolerated       NO  OXYCONTIN 03/20/16   Lonie Peak Dixon, PA-C  FIBER PO Take 1 tablet by mouth 2 (two) times daily.    Historical Provider, MD  HYDROmorphone (DILAUDID) 4 MG tablet Limit one half to one tablet by mouth per day  or twice per day  for breakthrough pain if tolerated while wearing fentanyl patch 03/20/16   Orlena Sheldon, PA-C  lisinopril (PRINIVIL,ZESTRIL) 40 MG tablet TAKE ONE TABLET BY MOUTH ONCE DAILY 04/16/16   Orlena Sheldon, PA-C  oxyCODONE 30 MG 12 hr tablet Limit 1 tablet by mouth every 12 hours if tolerated. PLEASE NOTE    Pill is 30 mg 02/21/16   Mohammed Kindle, MD  testosterone cypionate (DEPOTESTOSTERONE CYPIONATE) 200 MG/ML injection Inject 1 mL (200 mg total) into the muscle every 14 (fourteen) days. 10/27/15   Orlena Sheldon, PA-C    Family History Family History  Problem Relation Age of Onset  . Arthritis Mother   . Heart disease Mother   . Hyperlipidemia Mother   . Cancer Father   . Alcohol abuse Father     Social History Social History  Substance Use Topics  . Smoking status: Former Smoker    Packs/day: 0.50    Years: 8.00    Types: Cigarettes    Quit date: 04/28/1992  . Smokeless tobacco: Never Used  . Alcohol use 1.2 oz/week    2 Glasses of wine per week     Comment: occ     Allergies   Flonase [fluticasone]; Oxycodone; and Morphine and related   Review of Systems Review of Systems  Constitutional: Negative for activity change.       All ROS Neg except as noted in HPI  HENT: Negative for nosebleeds.   Eyes: Negative for photophobia and discharge.  Respiratory: Negative for cough, shortness of breath and wheezing.   Cardiovascular: Negative for chest pain and palpitations.  Gastrointestinal: Positive for abdominal pain, constipation and flatus. Negative for blood in stool.  Genitourinary: Negative for dysuria, frequency and  hematuria.  Musculoskeletal: Positive for arthralgias. Negative for back pain and neck pain.  Skin: Negative.   Neurological: Negative for dizziness, seizures and speech difficulty.  Psychiatric/Behavioral: Negative for confusion and hallucinations.  All other systems reviewed and are negative.    Physical Exam Updated Vital Signs BP 153/98 (BP Location: Left Arm)   Pulse 81   Temp 98 F (36.7 C) (Oral)   Resp 18   Ht 6' (1.829 m)   Wt 119.7 kg   SpO2 97%   BMI 35.80 kg/m   Physical Exam  Constitutional: Vital signs are normal. He appears well-developed and well-nourished. He is active.  HENT:  Head: Normocephalic and atraumatic.  Right Ear: Tympanic membrane, external ear and ear canal normal.  Left Ear: Tympanic membrane, external ear and ear canal normal.  Nose: Nose normal.  Mouth/Throat: Uvula is midline, oropharynx is clear and moist and mucous membranes are normal.  Eyes: Conjunctivae, EOM and lids are normal. Pupils are equal, round, and reactive to light.  Neck: Trachea normal, normal range of motion and phonation normal. Neck supple. Carotid bruit is not present.  Cardiovascular: Normal rate, regular rhythm and normal pulses.   Abdominal: Soft. Normal appearance and bowel sounds are normal. He exhibits no fluid wave, no abdominal bruit and no pulsatile midline mass. There is tenderness in the right upper quadrant and epigastric area.  Lymphadenopathy:       Head (right side): No submental, no preauricular and no posterior auricular adenopathy present.       Head (left side): No submental, no preauricular and no posterior auricular adenopathy present.    He has no cervical adenopathy.  Neurological: He is alert. He has normal strength. No cranial nerve deficit or sensory deficit. GCS eye subscore is 4. GCS verbal subscore is 5. GCS motor subscore is 6.  Skin: Skin is warm and dry.  Psychiatric: His speech is normal.     ED Treatments / Results  Labs (all labs  ordered are listed, but only abnormal results are displayed) Labs Reviewed - No data to display  EKG  EKG Interpretation None       Radiology No results found.  Procedures Procedures (including critical care time)  Medications Ordered in ED Medications - No data to display   Initial Impression / Assessment and Plan / ED Course  I have reviewed the triage vital signs and the nursing notes.  Pertinent labs & imaging results that were available during my care of the patient were reviewed by me and considered in my medical decision making (see chart for details).  Clinical Course     *I have reviewed nursing notes, vital signs, and all appropriate lab and imaging results for this patient.*  Final Clinical Impressions(s) / ED Diagnoses  The vital signs within normal limits. The acute abdomen series reveals increased stool burden present. There is a nonobstructive bowel gas pattern present no active disease noted in the chest. The competence of metabolic panel is nonacute. The anion gap is 7. The complete blood count is well within normal limits. The lipase is normal, and the troponin is less than 0.03.  Patient very concerned that he may have a "blockage". CT abdomen and pelvis obtained. CT scan shows no acute abnormality. There is no evidence for bowel obstruction or acute bowel inflammation. The appendix is normal. There is some atherosclerotic changes involving the aorta, but otherwise within normal limits.  The patient is advised to use warm flavor and mineral oil and fleets mineral oil enema daily until the constipation issue has resolved. We also discussed the need for increasing fiber and fluids. Patient is in agreement with this plan.    Final diagnoses:  None    New Prescriptions New Prescriptions   No medications on file     Lily Kocher, PA-C 05/08/16 1332    Milton Ferguson, MD 05/08/16 1517

## 2016-05-08 NOTE — ED Triage Notes (Signed)
Pt reports has not had BM x2 weeks. Pt reports is having flatus but reports pain,distention and nausea.

## 2016-05-09 ENCOUNTER — Telehealth: Payer: Self-pay | Admitting: Gastroenterology

## 2016-05-09 MED ORDER — TESTOSTERONE CYPIONATE 200 MG/ML IM SOLN: INTRAMUSCULAR | 1 refills | Status: DC

## 2016-05-09 NOTE — Telephone Encounter (Signed)
rx faxed to pharmacy

## 2016-05-09 NOTE — Telephone Encounter (Signed)
Yes

## 2016-05-09 NOTE — Telephone Encounter (Signed)
LRF 10/27/15 19ml + 1  Last PSA/Test lab 10/18/15.  OK refill?

## 2016-05-09 NOTE — Telephone Encounter (Signed)
Can we accept him as a new patient? He was seen in the ED recently for impaction and hasn't had a BM in 2 weeks.

## 2016-05-09 NOTE — Telephone Encounter (Signed)
Reviewed his chart. At this point he only has to have these labs done annually. They were all last check 10/18/15. We can give him refills to last until April 2018.

## 2016-05-10 ENCOUNTER — Encounter: Payer: Self-pay | Admitting: Gastroenterology

## 2016-05-13 ENCOUNTER — Ambulatory Visit (INDEPENDENT_AMBULATORY_CARE_PROVIDER_SITE_OTHER): Payer: PPO | Admitting: Nurse Practitioner

## 2016-05-13 ENCOUNTER — Encounter: Payer: Self-pay | Admitting: Nurse Practitioner

## 2016-05-13 ENCOUNTER — Other Ambulatory Visit: Payer: Self-pay | Admitting: Physician Assistant

## 2016-05-13 DIAGNOSIS — K5900 Constipation, unspecified: Secondary | ICD-10-CM | POA: Diagnosis not present

## 2016-05-13 DIAGNOSIS — R109 Unspecified abdominal pain: Secondary | ICD-10-CM | POA: Insufficient documentation

## 2016-05-13 DIAGNOSIS — R1084 Generalized abdominal pain: Secondary | ICD-10-CM

## 2016-05-13 NOTE — Patient Instructions (Signed)
1. We will request your previous colonoscopy records.  2. Increase Benefiber to twice a day. 3. Continue to drink plenty of fluids. 4. Continue taking Colace once a day. 5. Start taking Movantik 25 mg once a day. I will provide you with samples to last 2 weeks. 6. If no significant bowel movement in the next 24-48 hours, start a MiraLAX purge (instructions below). 7. Call in 2-3 days with a progress report. 8. We may adjust her medications over the phone at that time depending on her response. 9. If you have concerning symptoms including high fever, worsening/severe abdominal pain, uncontrollable nausea and vomiting, inability to keep down fluids or food, proceed to the emergency room. 10. Return for follow-up in 2 weeks.   Miralax purge instructions: For Miralax purge: Take 17 gm (1 capful or 1 packet) of Miralax mixed into water, Gatorade, or Powerade. Drink this mixture followed by 8 ounces of plain water. Do this once an hour until you have a good bowel movement, but no more than 5 doses.
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
## 2016-05-13 NOTE — Telephone Encounter (Signed)
Ok to refill 

## 2016-05-13 NOTE — Telephone Encounter (Signed)
Approved. Can refill for another 5 months worth.
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
## 2016-05-14 NOTE — Progress Notes (Signed)
cc'd to pcp 

## 2016-05-14 NOTE — Telephone Encounter (Signed)
Rx refilled.

## 2016-05-15 ENCOUNTER — Other Ambulatory Visit: Payer: Self-pay | Admitting: Physician Assistant

## 2016-05-16 NOTE — Telephone Encounter (Signed)
This Rx has been called in twice first on 11-14 at 11:18am  pharmacy stated they did not rec it. Then it was called in again on 11-16 at 10:10am

## 2016-05-20 ENCOUNTER — Ambulatory Visit: Payer: PPO | Admitting: Nurse Practitioner

## 2016-05-29 ENCOUNTER — Ambulatory Visit: Payer: PPO | Admitting: Nurse Practitioner

## 2016-08-06 ENCOUNTER — Other Ambulatory Visit: Payer: Self-pay | Admitting: Pain Medicine

## 2016-08-06 DIAGNOSIS — M533 Sacrococcygeal disorders, not elsewhere classified: Secondary | ICD-10-CM | POA: Diagnosis not present

## 2016-08-06 DIAGNOSIS — M47817 Spondylosis without myelopathy or radiculopathy, lumbosacral region: Secondary | ICD-10-CM | POA: Diagnosis not present

## 2016-08-06 DIAGNOSIS — M791 Myalgia: Secondary | ICD-10-CM | POA: Diagnosis not present

## 2016-08-06 DIAGNOSIS — M5416 Radiculopathy, lumbar region: Secondary | ICD-10-CM | POA: Diagnosis not present

## 2016-08-12 ENCOUNTER — Ambulatory Visit: Payer: Self-pay | Admitting: Physician Assistant

## 2016-08-14 ENCOUNTER — Encounter: Payer: Self-pay | Admitting: Physician Assistant

## 2016-08-14 ENCOUNTER — Ambulatory Visit (INDEPENDENT_AMBULATORY_CARE_PROVIDER_SITE_OTHER): Payer: PPO | Admitting: Physician Assistant

## 2016-08-14 VITALS — BP 140/92 | HR 91 | Temp 98.0°F | Resp 18 | Wt 280.0 lb

## 2016-08-14 DIAGNOSIS — M48061 Spinal stenosis, lumbar region without neurogenic claudication: Secondary | ICD-10-CM

## 2016-08-14 DIAGNOSIS — H8110 Benign paroxysmal vertigo, unspecified ear: Secondary | ICD-10-CM | POA: Diagnosis not present

## 2016-08-14 DIAGNOSIS — M5116 Intervertebral disc disorders with radiculopathy, lumbar region: Secondary | ICD-10-CM

## 2016-08-14 DIAGNOSIS — M1288 Other specific arthropathies, not elsewhere classified, other specified site: Secondary | ICD-10-CM | POA: Diagnosis not present

## 2016-08-14 DIAGNOSIS — M47816 Spondylosis without myelopathy or radiculopathy, lumbar region: Secondary | ICD-10-CM

## 2016-08-14 MED ORDER — MECLIZINE HCL 25 MG PO TABS
25.0000 mg | ORAL_TABLET | Freq: Three times a day (TID) | ORAL | 0 refills | Status: DC | PRN
Start: 2016-08-14 — End: 2016-09-02

## 2016-08-14 MED ORDER — PREGABALIN 75 MG PO CAPS: ORAL_CAPSULE | ORAL | 1 refills | Status: DC
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
## 2016-08-20 ENCOUNTER — Encounter: Payer: Self-pay | Admitting: Physician Assistant

## 2016-09-02 ENCOUNTER — Other Ambulatory Visit: Payer: Self-pay | Admitting: Physician Assistant

## 2016-09-02 DIAGNOSIS — H8110 Benign paroxysmal vertigo, unspecified ear: Secondary | ICD-10-CM

## 2016-09-03 DIAGNOSIS — M47817 Spondylosis without myelopathy or radiculopathy, lumbosacral region: Secondary | ICD-10-CM | POA: Diagnosis not present

## 2016-09-03 DIAGNOSIS — M5416 Radiculopathy, lumbar region: Secondary | ICD-10-CM | POA: Diagnosis not present

## 2016-09-03 DIAGNOSIS — Z79891 Long term (current) use of opiate analgesic: Secondary | ICD-10-CM | POA: Diagnosis not present

## 2016-09-03 DIAGNOSIS — G894 Chronic pain syndrome: Secondary | ICD-10-CM | POA: Diagnosis not present

## 2016-09-03 DIAGNOSIS — M533 Sacrococcygeal disorders, not elsewhere classified: Secondary | ICD-10-CM | POA: Diagnosis not present

## 2016-09-03 DIAGNOSIS — M791 Myalgia: Secondary | ICD-10-CM | POA: Diagnosis not present

## 2016-09-03 MED ORDER — MECLIZINE HCL 25 MG PO TABS: 25.0000 mg | ORAL_TABLET | Freq: Three times a day (TID) | ORAL | 0 refills | Status: AC | PRN

## 2016-09-06 ENCOUNTER — Telehealth: Payer: Self-pay

## 2016-09-06 NOTE — Telephone Encounter (Signed)
Patient called and left a VM stating that his leg was swollen and that his feet was turning a bluish color. Patient states he cut back on the lyrica because he was having side effects.  Called and spoke with patient he states he will make an appointment for 3-15 to talk with Olean Ree about  having vascular testing done tried to get patient in sooner but that was the only time and day that would work for him

## 2016-09-09 ENCOUNTER — Other Ambulatory Visit: Payer: Self-pay | Admitting: Physician Assistant

## 2016-09-09 ENCOUNTER — Encounter: Payer: Self-pay | Admitting: Physician Assistant

## 2016-09-09 MED ORDER — PREDNISONE 20 MG PO TABS: ORAL_TABLET | ORAL | 0 refills | Status: AC

## 2016-09-10 ENCOUNTER — Encounter: Payer: Self-pay | Admitting: Pain Medicine

## 2016-09-12 ENCOUNTER — Encounter: Payer: Self-pay | Admitting: Physician Assistant

## 2016-09-12 ENCOUNTER — Ambulatory Visit (INDEPENDENT_AMBULATORY_CARE_PROVIDER_SITE_OTHER): Payer: PPO | Admitting: Physician Assistant

## 2016-09-12 VITALS — BP 158/90 | HR 89 | Temp 98.2°F | Resp 18

## 2016-09-12 DIAGNOSIS — M549 Dorsalgia, unspecified: Secondary | ICD-10-CM | POA: Diagnosis not present

## 2016-09-12 DIAGNOSIS — Z981 Arthrodesis status: Secondary | ICD-10-CM

## 2016-09-12 DIAGNOSIS — M5030 Other cervical disc degeneration, unspecified cervical region: Secondary | ICD-10-CM

## 2016-09-12 DIAGNOSIS — R600 Localized edema: Secondary | ICD-10-CM

## 2016-09-12 DIAGNOSIS — M5136 Other intervertebral disc degeneration, lumbar region: Secondary | ICD-10-CM | POA: Diagnosis not present

## 2016-09-12 DIAGNOSIS — M1288 Other specific arthropathies, not elsewhere classified, other specified site: Secondary | ICD-10-CM | POA: Diagnosis not present

## 2016-09-12 DIAGNOSIS — M069 Rheumatoid arthritis, unspecified: Secondary | ICD-10-CM

## 2016-09-12 DIAGNOSIS — M47816 Spondylosis without myelopathy or radiculopathy, lumbar region: Secondary | ICD-10-CM

## 2016-09-12 DIAGNOSIS — M5116 Intervertebral disc disorders with radiculopathy, lumbar region: Secondary | ICD-10-CM | POA: Diagnosis not present

## 2016-09-12 DIAGNOSIS — M48061 Spinal stenosis, lumbar region without neurogenic claudication: Secondary | ICD-10-CM

## 2016-09-12 DIAGNOSIS — M533 Sacrococcygeal disorders, not elsewhere classified: Secondary | ICD-10-CM | POA: Diagnosis not present

## 2016-09-12 DIAGNOSIS — M47812 Spondylosis without myelopathy or radiculopathy, cervical region: Secondary | ICD-10-CM

## 2016-09-12 MED ORDER — HYDROCHLOROTHIAZIDE 25 MG PO TABS: 25.0000 mg | ORAL_TABLET | Freq: Every day | ORAL | 0 refills | Status: AC

## 2016-09-12 NOTE — Progress Notes (Signed)
Patient ID: LERONE ONDER MRN: [id number], DOB: [birth year], 56 y.o. Date of Encounter: @DATE @  Chief Complaint:  Chief Complaint  Patient presents with  . Back Pain    x3wks  . leg and feet swelling  . weakness in both arms  . knot on left side of spine    noticed 2wks ago    HPI: 56 y.o. year old male  presents with above.   His wife is here with him for visit today.  Says that he recently saw Dr. crisp on the fourth. Says that at that visit he told him about his back and the weakness in his arms and patient asked him about a steroid injection but Dr. crisp said "not right now" patient says he then asked about an MRI and Dr. crisp replied that he would have to ask his PCP that if PCP felt that was indicated they would have to order. Says that Dr. Primus Bravo recommended a prednisone pack and to call the PCP for that and that he did call me and I did send in that prescription several days ago. States that he is having increased amount of weakness in his arms and decreased grip strength bilaterally.  Points to mid back left greater than right and says that that area is having increased pain. Says the medicines that Dr. Primus Bravo prescribes have been the same and have not been decreased but patient is feeling increasing amount of pain. Also is noticing some swelling in both feet. Says that Dr. crisp had told him that needs referral from PCP to see the rheumatologist and surgeon. Says he was seeing a Rheumatologist  in [place] but had quit going to them. Says that he was on methotrexate in the past but was concerned about the side effects and quit it.   Past Medical History:  Diagnosis Date  . Arthritis   . Gout   . Hyperlipidemia   . Hypertension   . Neuromuscular disorder ([place])      Home Meds: Outpatient Medications Prior to Visit  Medication Sig Dispense Refill  . cholecalciferol (VITAMIN D) 1000 UNITS tablet Take 1,000 Units by mouth.    . fentaNYL (DURAGESIC - DOSED MCG/HR) 50  MCG/HR Apply one patch to skin every 2 days if tolerated       NO  OXYCONTIN 15 patch 0  . HYDROmorphone (DILAUDID) 4 MG tablet Limit one half to one tablet by mouth per day  or twice per day  for breakthrough pain if tolerated while wearing fentanyl patch 60 tablet 0  . lisinopril (PRINIVIL,ZESTRIL) 40 MG tablet TAKE ONE TABLET BY MOUTH ONCE DAILY 90 tablet 3  . meclizine (ANTIVERT) 25 MG tablet Take 1 tablet (25 mg total) by mouth 3 (three) times daily as needed for dizziness. 30 tablet 0  . Multiple Vitamins-Minerals (ZINC PO) Take 1 tablet by mouth daily.    . predniSONE (DELTASONE) 20 MG tablet Take 3 daily for 2 days, then 2 daily for 2 days, then 1 daily for 2 days. 12 tablet 0  . testosterone cypionate (DEPOTESTOSTERONE CYPIONATE) 200 MG/ML injection INJECT 1ML INTRAMUSCULARLY EVERY 14 DAYS. 10 mL 0  . pregabalin (LYRICA) 75 MG capsule Take one 2 times per day. After one week--if symptoms persist--May increase to taking 2 twice a day. Thereafter, dose can be further titrated up but call for further instructions. 60 capsule 1   Facility-Administered Medications Prior to Visit  Medication Dose Route Frequency Provider Last Rate Last Dose  .  bupivacaine (PF) (MARCAINE) 0.25 % injection 30 mL  30 mL Other Once Mohammed Kindle, MD      . ceFAZolin (ANCEF) IVPB 1 g/50 mL premix  1 g Intravenous Once Mohammed Kindle, MD      . ceFAZolin (ANCEF) IVPB 1 g/50 mL premix  1 g Intravenous Once Mohammed Kindle, MD      . fentaNYL (SUBLIMAZE) injection 100 mcg  100 mcg Intravenous Once Mohammed Kindle, MD      . lactated ringers infusion 1,000 mL  1,000 mL Intravenous Continuous Mohammed Kindle, MD      . lactated ringers infusion 1,000 mL  1,000 mL Intravenous Continuous Mohammed Kindle, MD      . lactated ringers infusion 1,000 mL  1,000 mL Intravenous Continuous Mohammed Kindle, MD      . lactated ringers infusion 1,000 mL  1,000 mL Intravenous Continuous Mohammed Kindle, MD      . lidocaine (PF) (XYLOCAINE) 1 %  injection 10 mL  10 mL Subcutaneous Once Mohammed Kindle, MD      . midazolam (VERSED) 5 MG/5ML injection 5 mg  5 mg Intravenous Once Mohammed Kindle, MD      . midazolam (VERSED) 5 MG/5ML injection 5 mg  5 mg Intravenous Once Mohammed Kindle, MD      . orphenadrine (NORFLEX) injection 60 mg  60 mg Intramuscular Once Mohammed Kindle, MD      . orphenadrine (NORFLEX) injection 60 mg  60 mg Intramuscular Once Mohammed Kindle, MD      . triamcinolone acetonide (KENALOG-40) injection 40 mg  40 mg Other Once Mohammed Kindle, MD        Allergies:  Allergies  Allergen Reactions  . Flonase [Fluticasone] Swelling, Anaphylaxis and Rash  . Oxycodone Swelling  . Morphine And Related Itching and Rash    Rash developed after about 10 days of taking the tablets    Social History   Social History  . Marital status: Married    Spouse name: N/A  . Number of children: N/A  . Years of education: N/A   Occupational History  . Not on file.   Social History Main Topics  . Smoking status: Former Smoker    Packs/day: 0.50    Years: 8.00    Types: Cigarettes    Quit date: 04/28/1992  . Smokeless tobacco: Never Used  . Alcohol use 1.2 oz/week    2 Glasses of wine per week     Comment: occ  . Drug use: No  . Sexual activity: Yes    Birth control/ protection: None   Other Topics Concern  . Not on file   Social History Narrative  . No narrative on file    Family History  Problem Relation Age of Onset  . Arthritis Mother   . Heart disease Mother   . Hyperlipidemia Mother   . Cancer Father   . Alcohol abuse Father   . Colon cancer Neg Hx      Review of Systems:  See HPI for pertinent ROS. All other ROS negative.    Physical Exam: Blood pressure (!) 158/90, pulse 89, temperature 98.2 F (36.8 C), temperature source Oral, resp. rate 18, SpO2 95 %., There is no height or weight on file to calculate BMI. General: Obese WM. Appears in no acute distress. Neck: Supple. No thyromegaly. No  lymphadenopathy. Lungs: Clear bilaterally to auscultation without wheezes, rales, or rhonchi. Breathing is unlabored. Heart: RRR with S1 S2. No murmurs, rubs, or gallops. Musculoskeletal:  Points  to left side of back at approx T10 level as area of pain. This area is tender with palpation.  He has some weakness of upper extremities against resistance and slight decreased grip strength bilaterally. Pain with palpation both sides of neck. Decreased ROM of neck.  Extremities/Skin: Trace LE edema bilaterally. Neuro: Alert and oriented X 3. Moves all extremities spontaneously.  CNII-XII grossly in tact. Psych:  Responds to questions appropriately with a normal affect.     ASSESSMENT AND PLAN:  56 y.o. year old male with  1. Lumbar disc disease with radiculopathy - Ambulatory referral to Neurosurgery Has seen Dr. Carloyn Manner in the past. Added a comment to schedule with Dr. Carloyn Manner.  2. DDD (degenerative disc disease), cervical He has had no recent MRI of neck--will obtain  - Ambulatory referral to Neurosurgery - MR Cervical Spine Wo Contrast; Future  3. Spinal stenosis of lumbar region, unspecified whether neurogenic claudication present - Ambulatory referral to Neurosurgery  4. Status post cervical spinal fusion - Ambulatory referral to Neurosurgery - MR Cervical Spine Wo Contrast; Future  5. Sacroiliac joint dysfunction of both sides - Ambulatory referral to Neurosurgery  6. Cervical facet syndrome - Ambulatory referral to Neurosurgery - MR Cervical Spine Wo Contrast; Future  7. Facet syndrome, lumbar - Ambulatory referral to Neurosurgery  8. DDD (degenerative disc disease), lumbar - Ambulatory referral to Neurosurgery  9. Rheumatoid arthritis involving multiple sites, unspecified rheumatoid factor presence ([place]) - Ambulatory referral to Rheumatology - Ambulatory referral to Neurosurgery - MR Cervical Spine Wo Contrast; Future  10. Acute mid back pain He has had no imaging of  thoracic spine. Now having pain in this area. Elio Forget - DG Thoracic Spine W/Swimmers; Future  11. Bilateral lower extremity edema He is to take HCTZ daily for 3 days then prn - hydrochlorothiazide (HYDRODIURIL) 25 MG tablet; Take 1 tablet (25 mg total) by mouth daily.  Dispense: 30 tablet; Refill: 0  Will f/u with him when I get XRay report of thoracic spine.  Will have him schedule f/u OV here in 1 month o/w.  [address] [place], [place], [facility] 09/12/2016 12:10 PM

## 2016-10-02 DIAGNOSIS — M47816 Spondylosis without myelopathy or radiculopathy, lumbar region: Secondary | ICD-10-CM | POA: Diagnosis not present

## 2016-10-02 DIAGNOSIS — M48061 Spinal stenosis, lumbar region without neurogenic claudication: Secondary | ICD-10-CM | POA: Diagnosis not present

## 2016-10-02 DIAGNOSIS — M5137 Other intervertebral disc degeneration, lumbosacral region: Secondary | ICD-10-CM | POA: Diagnosis not present

## 2016-10-02 DIAGNOSIS — Z79891 Long term (current) use of opiate analgesic: Secondary | ICD-10-CM | POA: Diagnosis not present

## 2016-10-02 DIAGNOSIS — M791 Myalgia: Secondary | ICD-10-CM | POA: Diagnosis not present

## 2016-10-02 DIAGNOSIS — M5416 Radiculopathy, lumbar region: Secondary | ICD-10-CM | POA: Diagnosis not present

## 2016-10-02 DIAGNOSIS — M5033 Other cervical disc degeneration, cervicothoracic region: Secondary | ICD-10-CM | POA: Diagnosis not present

## 2016-10-02 DIAGNOSIS — M47817 Spondylosis without myelopathy or radiculopathy, lumbosacral region: Secondary | ICD-10-CM | POA: Diagnosis not present

## 2016-10-02 DIAGNOSIS — M533 Sacrococcygeal disorders, not elsewhere classified: Secondary | ICD-10-CM | POA: Diagnosis not present

## 2016-10-02 DIAGNOSIS — M5031 Other cervical disc degeneration,  high cervical region: Secondary | ICD-10-CM | POA: Diagnosis not present

## 2016-10-02 DIAGNOSIS — M5412 Radiculopathy, cervical region: Secondary | ICD-10-CM | POA: Diagnosis not present

## 2016-10-02 DIAGNOSIS — M5136 Other intervertebral disc degeneration, lumbar region: Secondary | ICD-10-CM | POA: Diagnosis not present

## 2016-10-07 ENCOUNTER — Ambulatory Visit: Payer: Self-pay | Admitting: Physician Assistant

## 2016-11-04 DIAGNOSIS — M5137 Other intervertebral disc degeneration, lumbosacral region: Secondary | ICD-10-CM | POA: Diagnosis not present

## 2016-11-04 DIAGNOSIS — M5033 Other cervical disc degeneration, cervicothoracic region: Secondary | ICD-10-CM | POA: Diagnosis not present

## 2016-11-04 DIAGNOSIS — M48061 Spinal stenosis, lumbar region without neurogenic claudication: Secondary | ICD-10-CM | POA: Diagnosis not present

## 2016-11-04 DIAGNOSIS — M47817 Spondylosis without myelopathy or radiculopathy, lumbosacral region: Secondary | ICD-10-CM | POA: Diagnosis not present

## 2016-11-04 DIAGNOSIS — M5031 Other cervical disc degeneration,  high cervical region: Secondary | ICD-10-CM | POA: Diagnosis not present

## 2016-11-04 DIAGNOSIS — M791 Myalgia: Secondary | ICD-10-CM | POA: Diagnosis not present

## 2016-11-04 DIAGNOSIS — M47816 Spondylosis without myelopathy or radiculopathy, lumbar region: Secondary | ICD-10-CM | POA: Diagnosis not present

## 2016-11-04 DIAGNOSIS — M5412 Radiculopathy, cervical region: Secondary | ICD-10-CM | POA: Diagnosis not present

## 2016-11-04 DIAGNOSIS — M5416 Radiculopathy, lumbar region: Secondary | ICD-10-CM | POA: Diagnosis not present

## 2016-11-04 DIAGNOSIS — M533 Sacrococcygeal disorders, not elsewhere classified: Secondary | ICD-10-CM | POA: Diagnosis not present

## 2016-11-04 DIAGNOSIS — M5136 Other intervertebral disc degeneration, lumbar region: Secondary | ICD-10-CM | POA: Diagnosis not present

## 2016-11-04 DIAGNOSIS — Z79891 Long term (current) use of opiate analgesic: Secondary | ICD-10-CM | POA: Diagnosis not present

## 2016-11-14 ENCOUNTER — Ambulatory Visit: Payer: PPO | Admitting: Physician Assistant

## 2016-11-18 DIAGNOSIS — Z79891 Long term (current) use of opiate analgesic: Secondary | ICD-10-CM | POA: Diagnosis not present

## 2016-11-18 DIAGNOSIS — M5033 Other cervical disc degeneration, cervicothoracic region: Secondary | ICD-10-CM | POA: Diagnosis not present

## 2016-11-18 DIAGNOSIS — M48061 Spinal stenosis, lumbar region without neurogenic claudication: Secondary | ICD-10-CM | POA: Diagnosis not present

## 2016-11-18 DIAGNOSIS — M5136 Other intervertebral disc degeneration, lumbar region: Secondary | ICD-10-CM | POA: Diagnosis not present

## 2016-11-18 DIAGNOSIS — M5031 Other cervical disc degeneration,  high cervical region: Secondary | ICD-10-CM | POA: Diagnosis not present

## 2016-11-18 DIAGNOSIS — M5137 Other intervertebral disc degeneration, lumbosacral region: Secondary | ICD-10-CM | POA: Diagnosis not present

## 2016-11-18 DIAGNOSIS — M5412 Radiculopathy, cervical region: Secondary | ICD-10-CM | POA: Diagnosis not present

## 2016-11-18 DIAGNOSIS — M47817 Spondylosis without myelopathy or radiculopathy, lumbosacral region: Secondary | ICD-10-CM | POA: Diagnosis not present

## 2016-11-18 DIAGNOSIS — M5416 Radiculopathy, lumbar region: Secondary | ICD-10-CM | POA: Diagnosis not present

## 2016-11-18 DIAGNOSIS — M47816 Spondylosis without myelopathy or radiculopathy, lumbar region: Secondary | ICD-10-CM | POA: Diagnosis not present

## 2016-11-18 DIAGNOSIS — M791 Myalgia: Secondary | ICD-10-CM | POA: Diagnosis not present

## 2016-11-18 DIAGNOSIS — M533 Sacrococcygeal disorders, not elsewhere classified: Secondary | ICD-10-CM | POA: Diagnosis not present

## 2016-12-17 DIAGNOSIS — Z08 Encounter for follow-up examination after completed treatment for malignant neoplasm: Secondary | ICD-10-CM | POA: Diagnosis not present

## 2016-12-17 DIAGNOSIS — M791 Myalgia: Secondary | ICD-10-CM | POA: Diagnosis not present

## 2016-12-17 DIAGNOSIS — M533 Sacrococcygeal disorders, not elsewhere classified: Secondary | ICD-10-CM | POA: Diagnosis not present

## 2016-12-17 DIAGNOSIS — L570 Actinic keratosis: Secondary | ICD-10-CM | POA: Diagnosis not present

## 2016-12-17 DIAGNOSIS — M47816 Spondylosis without myelopathy or radiculopathy, lumbar region: Secondary | ICD-10-CM | POA: Diagnosis not present

## 2016-12-17 DIAGNOSIS — M5137 Other intervertebral disc degeneration, lumbosacral region: Secondary | ICD-10-CM | POA: Diagnosis not present

## 2016-12-17 DIAGNOSIS — M5412 Radiculopathy, cervical region: Secondary | ICD-10-CM | POA: Diagnosis not present

## 2016-12-17 DIAGNOSIS — L821 Other seborrheic keratosis: Secondary | ICD-10-CM | POA: Diagnosis not present

## 2016-12-17 DIAGNOSIS — M5136 Other intervertebral disc degeneration, lumbar region: Secondary | ICD-10-CM | POA: Diagnosis not present

## 2016-12-17 DIAGNOSIS — M5031 Other cervical disc degeneration,  high cervical region: Secondary | ICD-10-CM | POA: Diagnosis not present

## 2016-12-17 DIAGNOSIS — X32XXXA Exposure to sunlight, initial encounter: Secondary | ICD-10-CM | POA: Diagnosis not present

## 2016-12-17 DIAGNOSIS — Z79891 Long term (current) use of opiate analgesic: Secondary | ICD-10-CM | POA: Diagnosis not present

## 2016-12-17 DIAGNOSIS — M47817 Spondylosis without myelopathy or radiculopathy, lumbosacral region: Secondary | ICD-10-CM | POA: Diagnosis not present

## 2016-12-17 DIAGNOSIS — Z85828 Personal history of other malignant neoplasm of skin: Secondary | ICD-10-CM | POA: Diagnosis not present

## 2016-12-17 DIAGNOSIS — M5416 Radiculopathy, lumbar region: Secondary | ICD-10-CM | POA: Diagnosis not present

## 2016-12-17 DIAGNOSIS — M48061 Spinal stenosis, lumbar region without neurogenic claudication: Secondary | ICD-10-CM | POA: Diagnosis not present

## 2016-12-17 DIAGNOSIS — D225 Melanocytic nevi of trunk: Secondary | ICD-10-CM | POA: Diagnosis not present

## 2016-12-17 DIAGNOSIS — M5033 Other cervical disc degeneration, cervicothoracic region: Secondary | ICD-10-CM | POA: Diagnosis not present

## 2017-02-05 ENCOUNTER — Other Ambulatory Visit: Payer: Self-pay | Admitting: Physician Assistant

## 2017-02-06 NOTE — Telephone Encounter (Signed)
He needs to come in for OV and labs.

## 2017-02-06 NOTE — Telephone Encounter (Signed)
Last OV 3/15 Last refill 05/2016 Ok to refill?

## 2017-02-06 NOTE — Telephone Encounter (Signed)
Letter mailed informing pt he is due for an office visit

## 2017-04-21 ENCOUNTER — Other Ambulatory Visit: Payer: Self-pay | Admitting: Physician Assistant

## 2017-06-03 IMAGING — CT CT ABD-PELV W/ CM
2 of 5 series · 16 of 46 positions shown, 18 images · IV contrast (iopamidol)
Comparison: Abdominal radiographs from earlier today. 09/02/2013 CT
abdomen/pelvis.

CLINICAL DATA: 55-year-old male with constipation, abdominal pain,
distention and nausea.

EXAM:
CT ABDOMEN AND PELVIS WITH CONTRAST
TECHNIQUE: Multidetector CT imaging of the abdomen and pelvis was performed
using the standard protocol following bolus administration of
intravenous contrast.
CONTRAST:  30mL QT8NU8-0SS IOPAMIDOL (QT8NU8-0SS) INJECTION 61%,
100mL QT8NU8-0SS IOPAMIDOL (QT8NU8-0SS) INJECTION 61%

[Series 2: axial st · axial · 0.98mm/px · z∈[+933,+1383]mm · 13 of 102 slices shown, 15 images]
[im 6/102  soft-tissue]
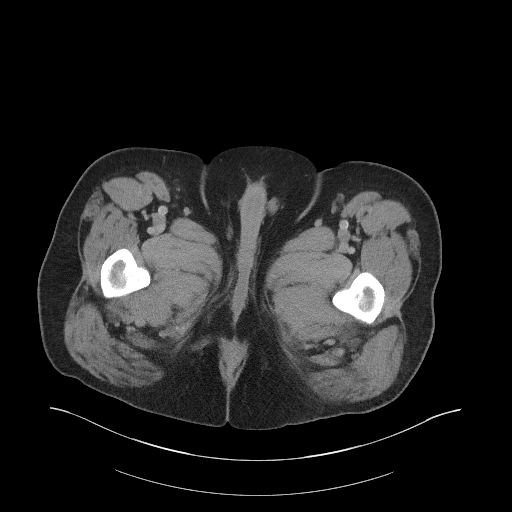
[im 6/102  bone]
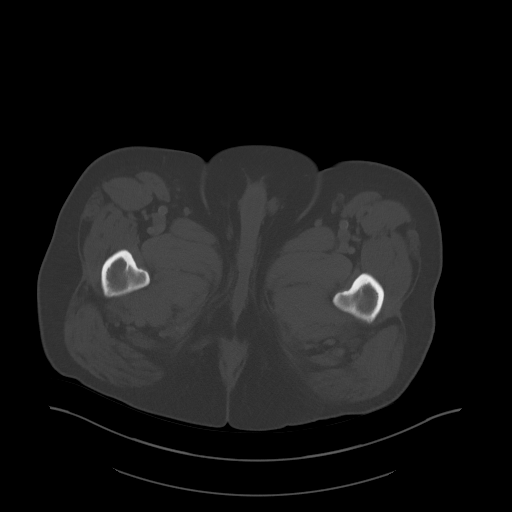
[im 16/102  soft-tissue]
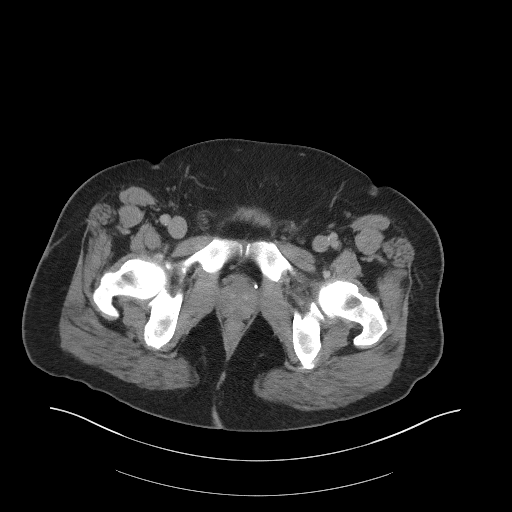
[im 22/102  soft-tissue]
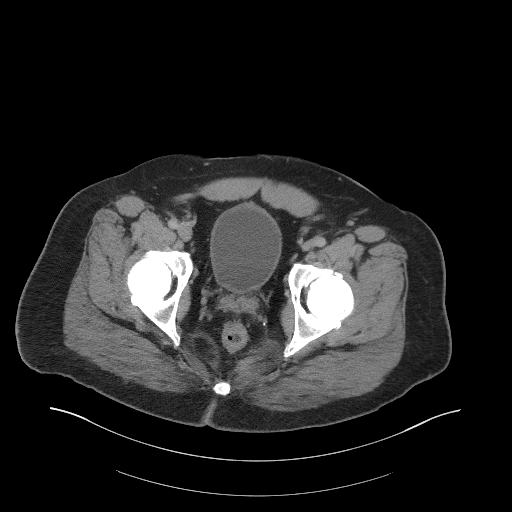
[im 27/102  soft-tissue]
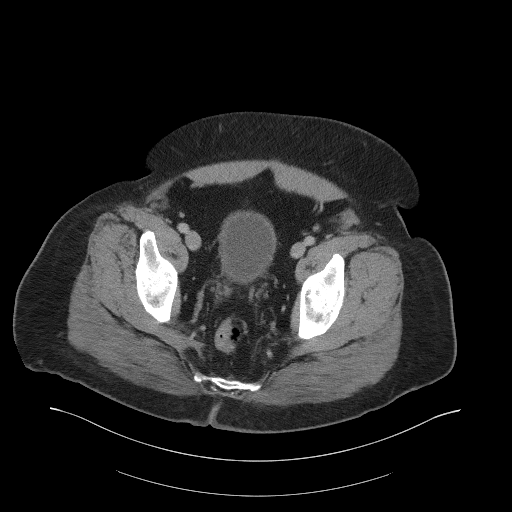
[im 38/102  soft-tissue]
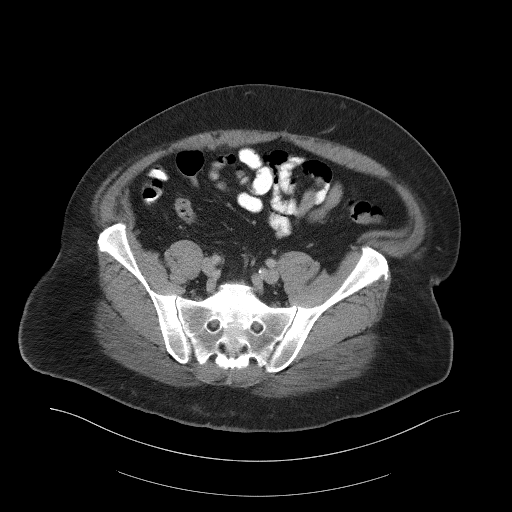
[im 43/102  soft-tissue]
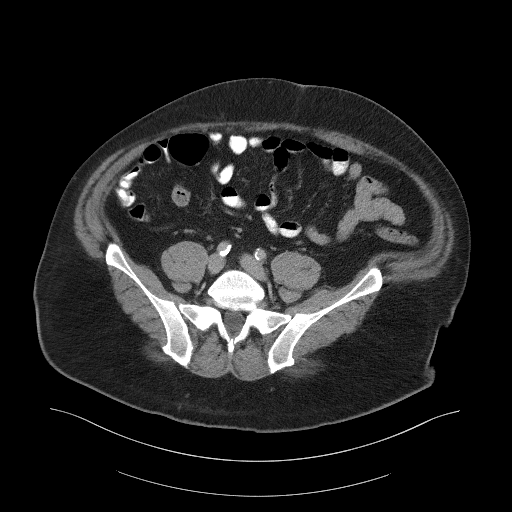
[im 54/102  soft-tissue]
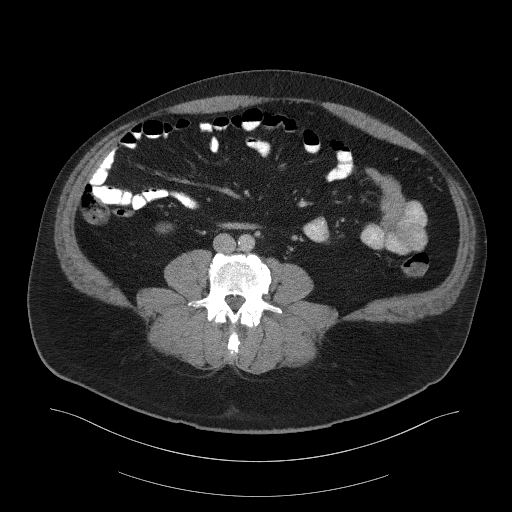
[im 59/102  soft-tissue]
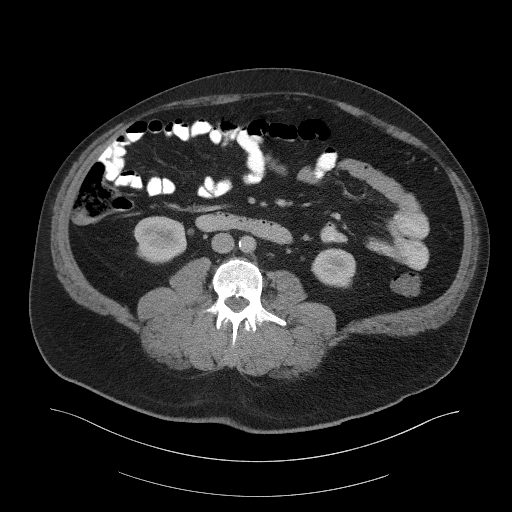
[im 64/102  soft-tissue]
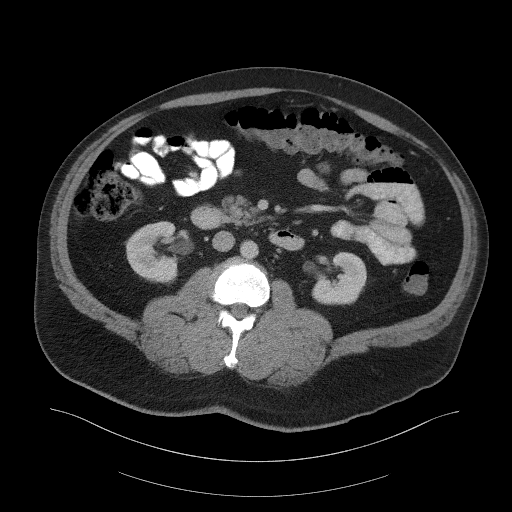
[im 64/102  bone]
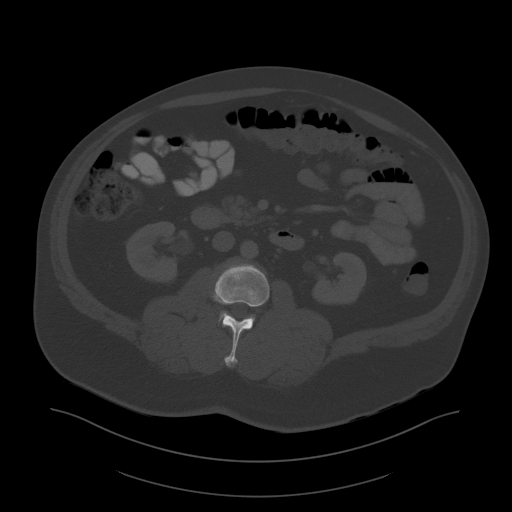
[im 75/102  soft-tissue]
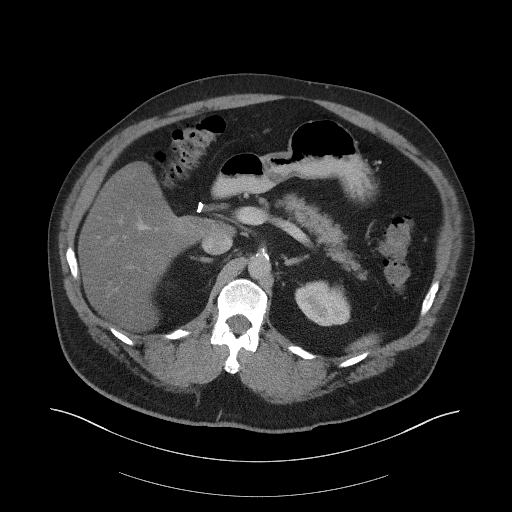
[im 80/102  soft-tissue]
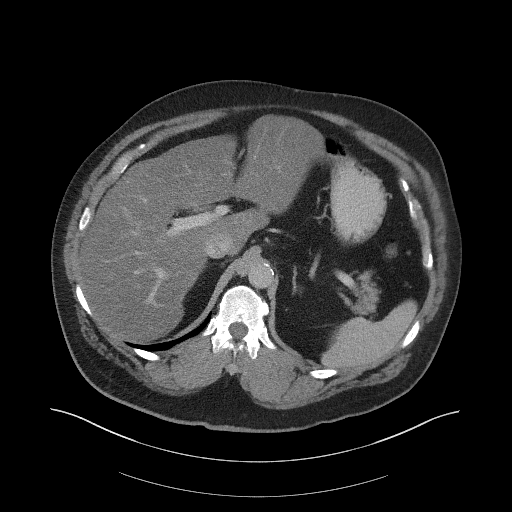
[im 86/102  soft-tissue]
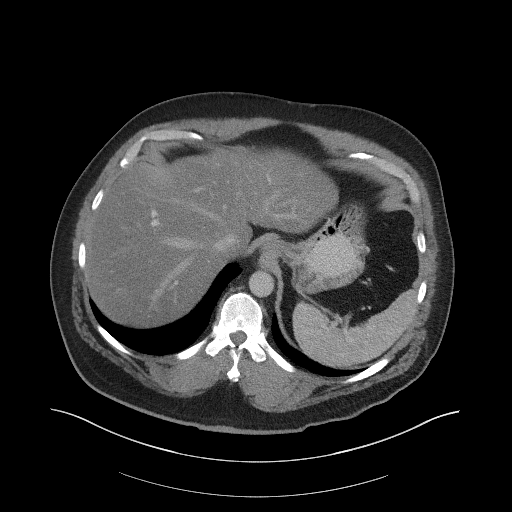
[im 96/102  soft-tissue]
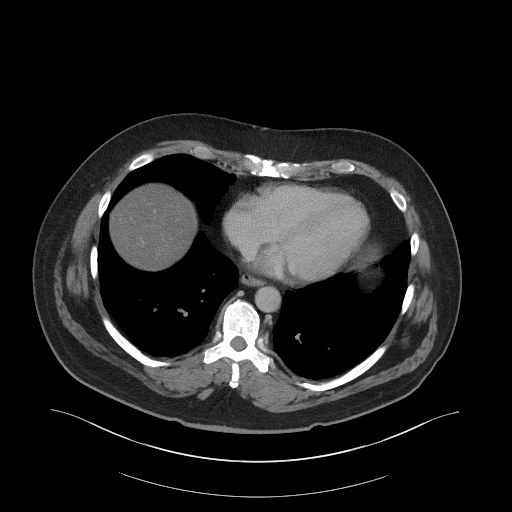

[Series 4: coronal st · coronal · 0.91mm/px · 3 of 116 slices shown]
[im 39/116  soft-tissue]
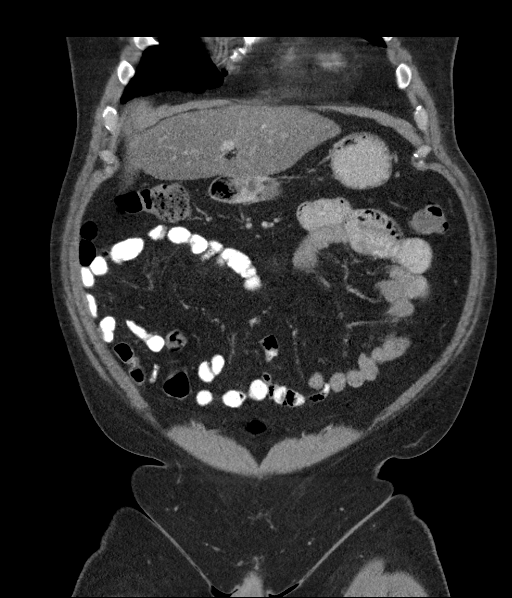
[im 52/116  soft-tissue]
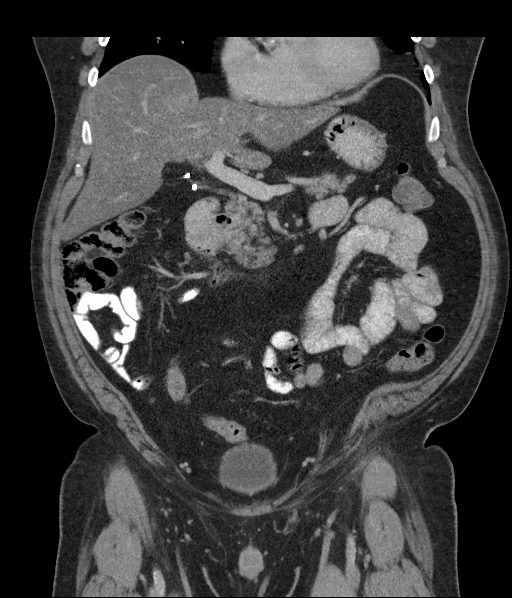
[im 64/116  soft-tissue]
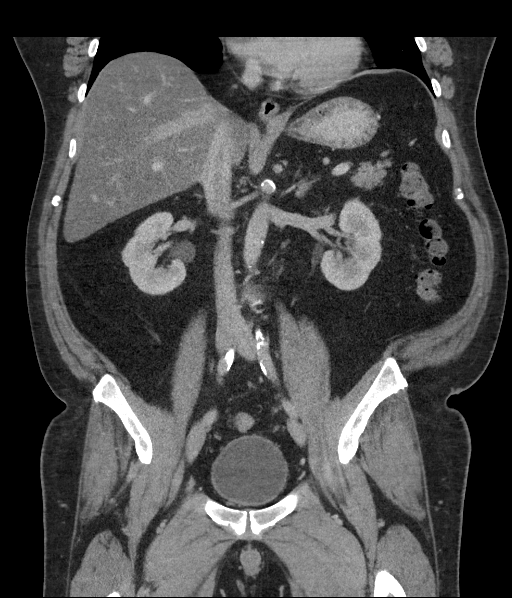

[16 of 46 positions shown; findings below may reference images not displayed]

FINDINGS: Lower chest: No significant pulmonary nodules or acute consolidative
airspace disease.

Hepatobiliary: Diffuse hepatic steatosis. No liver mass. No definite
liver surface irregularity. Cholecystectomy. No biliary ductal
dilatation. Stable small periampullary duodenal diverticulum.

Pancreas: Normal, with no mass or duct dilation.

Spleen: Normal size. No mass.

Adrenals/Urinary Tract: Normal adrenals. No hydronephrosis. No renal
mass. Normal bladder.

Stomach/Bowel: Grossly normal stomach. Normal caliber small bowel
with no small bowel wall thickening. Normal appendix. Normal large
bowel with no diverticulosis, large bowel wall thickening or
pericolonic fat stranding.

Vascular/Lymphatic: Atherosclerotic nonaneurysmal abdominal aorta.
Patent portal, splenic, hepatic and renal veins. No pathologically
enlarged lymph nodes in the abdomen or pelvis.

Reproductive: Normal size prostate.

Other: No pneumoperitoneum, ascites or focal fluid collection.

Musculoskeletal: No aggressive appearing focal osseous lesions.
Moderate thoracolumbar spondylosis.
IMPRESSION: 1. No acute abnormality. No evidence of bowel obstruction or acute
bowel inflammation. Normal appendix.
2. Cholecystectomy.  No biliary ductal dilatation.
3. Diffuse hepatic steatosis.
4. Aortic atherosclerosis.

## 2017-06-03 IMAGING — DX DG ABDOMEN ACUTE W/ 1V CHEST
4 series · 4 of 4 positions shown · non-contrast
Comparison: 08/25/2014 chest radiograph. 09/02/2013 CT
abdomen/pelvis.

CLINICAL DATA: Abdominal pain.  Nausea.  Constipation.

EXAM:
DG ABDOMEN ACUTE W/ 1V CHEST

[chest pa]
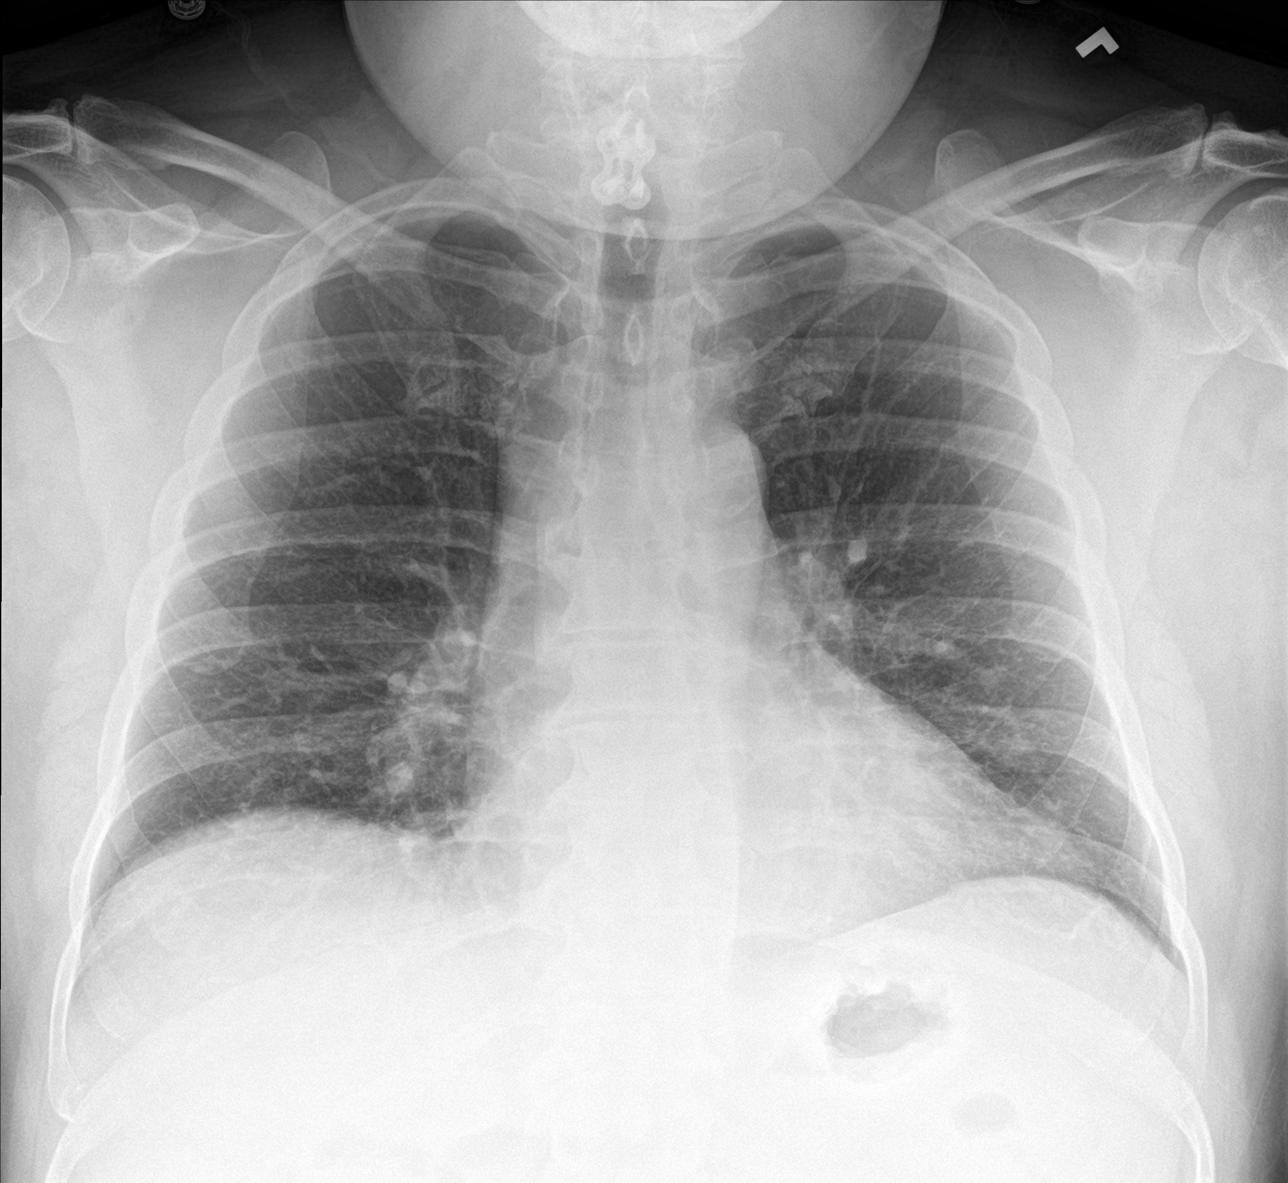

[abdomen erect]
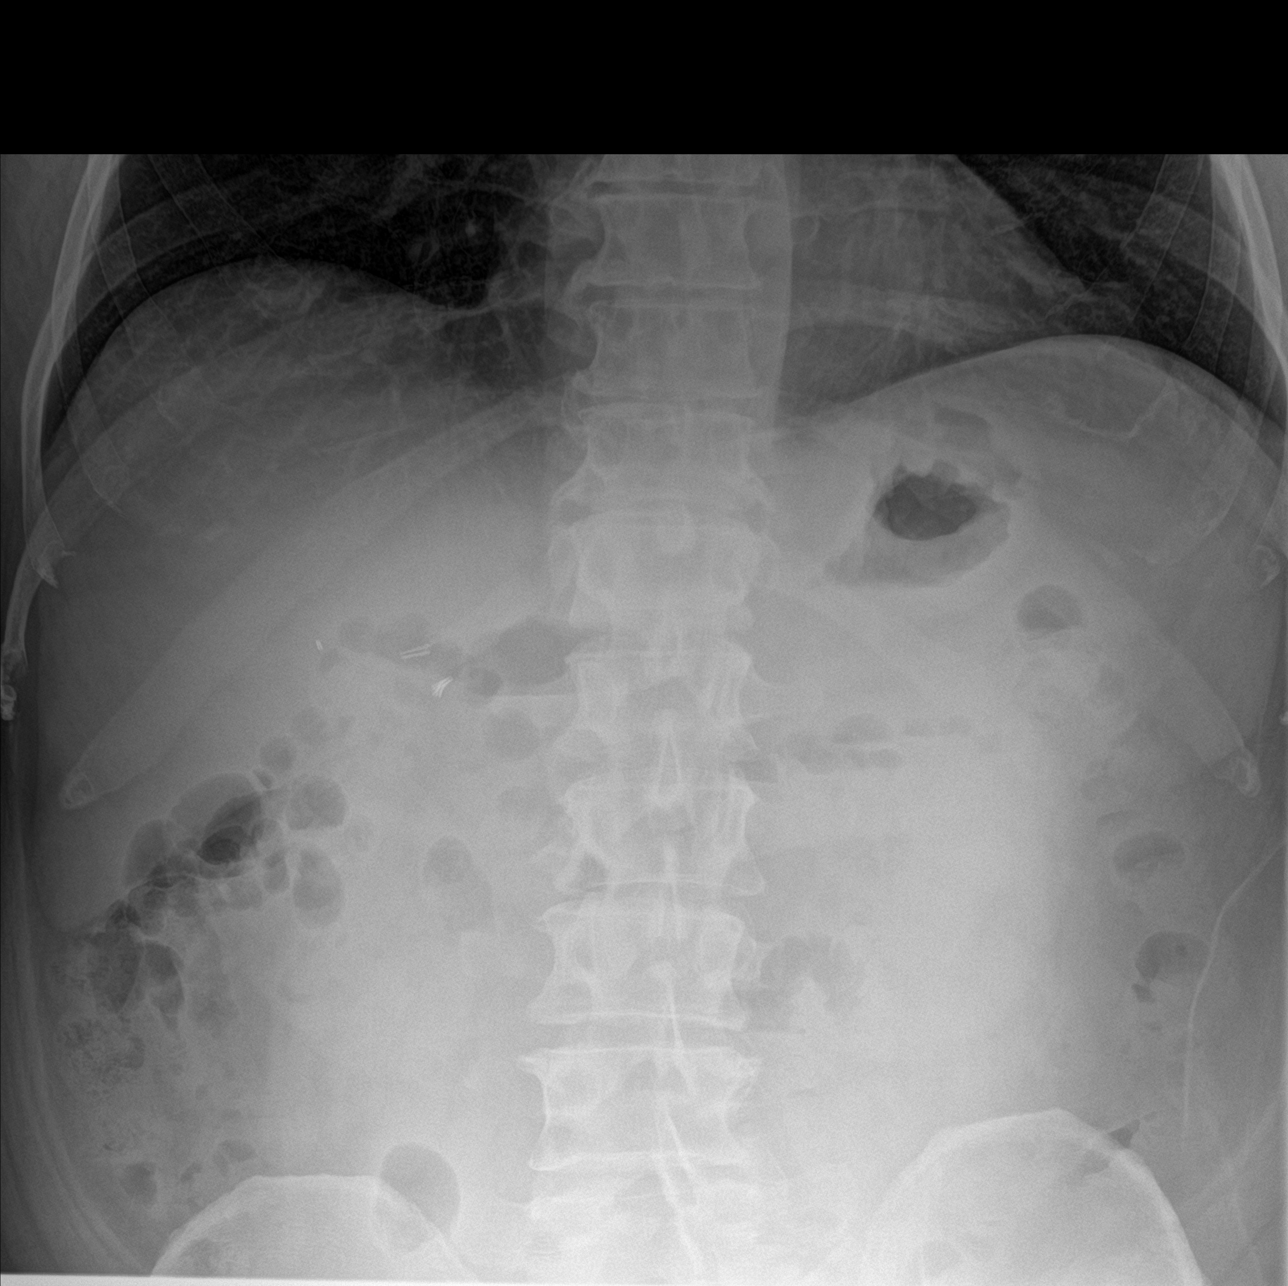

[abdomen supine (1 of 2)]
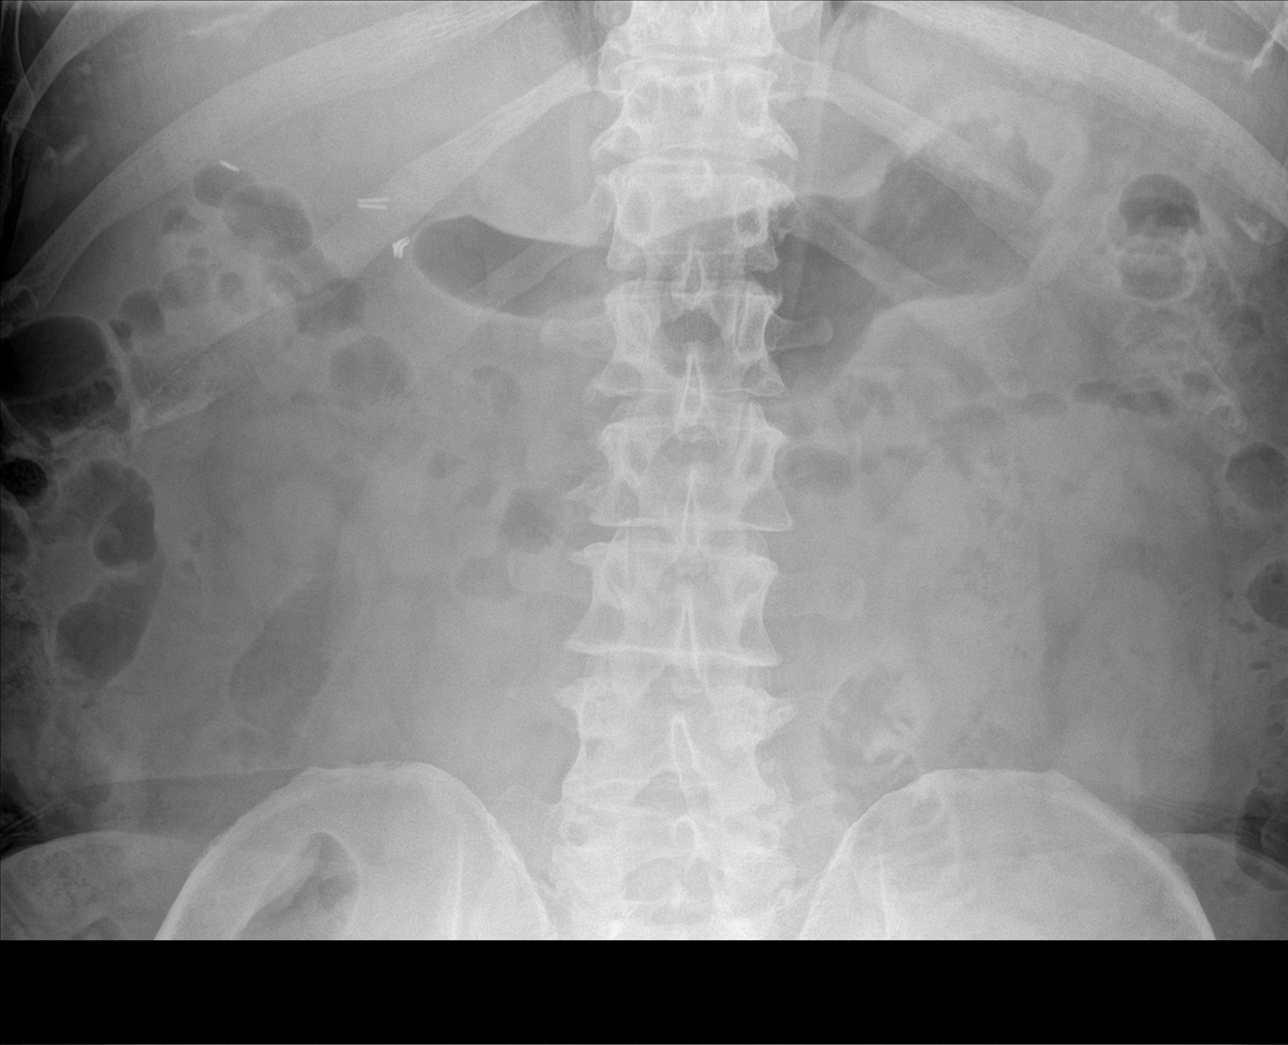

[abdomen supine (2 of 2)]
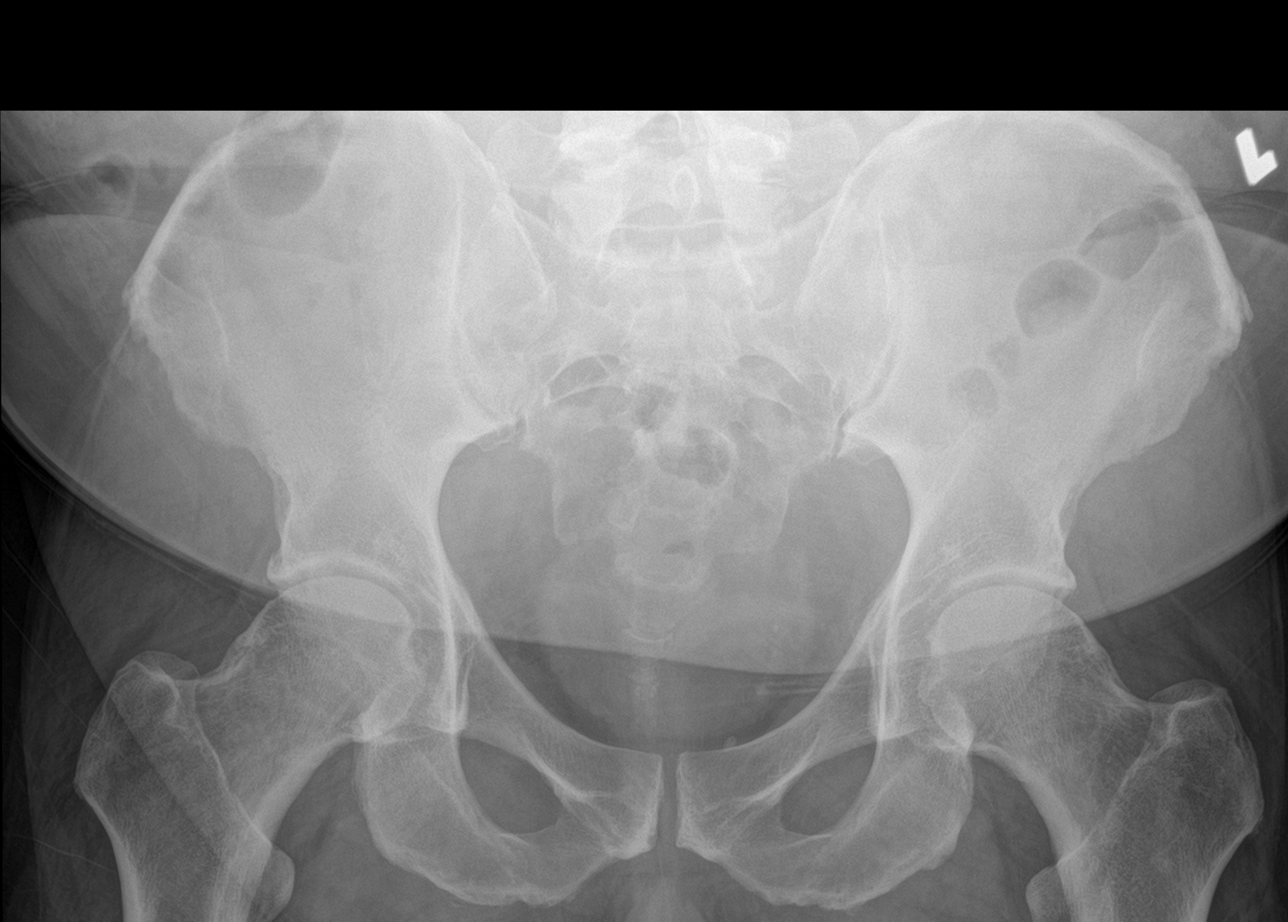

[4 of 4 positions shown; findings below may reference images not displayed]

FINDINGS: Surgical hardware from [facility] overlies the lower cervical spine.
Stable cardiomediastinal silhouette with normal heart size and
aortic atherosclerosis. No pneumothorax. No pleural effusion. Lungs
appear clear, with no acute consolidative airspace disease and no
pulmonary edema. Cholecystectomy clips are seen in the right upper
quadrant of the abdomen. No disproportionately dilated small bowel
loops or significant air-fluid levels. Mild colonic stool volume.
IMPRESSION: 1. No active disease in the chest.
2. Nonobstructive bowel gas pattern.  Mild colonic stool volume .
3. Aortic atherosclerosis.

## 2021-05-01 DEATH — deceased
